# Patient Record
Sex: Male | Born: 1935
Health system: Southern US, Community
[De-identification: ages and names within clinical notes are randomized; demographics above are authoritative.]

## PROBLEM LIST (undated history)

## (undated) DIAGNOSIS — C4490 Unspecified malignant neoplasm of skin, unspecified: Secondary | ICD-10-CM

## (undated) DIAGNOSIS — I340 Nonrheumatic mitral (valve) insufficiency: Secondary | ICD-10-CM

## (undated) DIAGNOSIS — E785 Hyperlipidemia, unspecified: Secondary | ICD-10-CM

## (undated) DIAGNOSIS — N400 Enlarged prostate without lower urinary tract symptoms: Secondary | ICD-10-CM

## (undated) DIAGNOSIS — I2510 Atherosclerotic heart disease of native coronary artery without angina pectoris: Secondary | ICD-10-CM

## (undated) DIAGNOSIS — I4891 Unspecified atrial fibrillation: Secondary | ICD-10-CM

## (undated) HISTORY — PX: POLYPECTOMY: SHX149

## (undated) HISTORY — DX: Atherosclerotic heart disease of native coronary artery without angina pectoris: I25.10

## (undated) HISTORY — DX: Hyperlipidemia, unspecified: E78.5

## (undated) HISTORY — DX: Unspecified malignant neoplasm of skin, unspecified: C44.90

## (undated) HISTORY — PX: COLONOSCOPY: SHX174

## (undated) HISTORY — PX: SKIN CANCER EXCISION: SHX779

## (undated) HISTORY — DX: Nonrheumatic mitral (valve) insufficiency: I34.0

## (undated) HISTORY — DX: Unspecified atrial fibrillation: I48.91

## (undated) HISTORY — DX: Benign prostatic hyperplasia without lower urinary tract symptoms: N40.0

---

## 2004-10-16 ENCOUNTER — Ambulatory Visit: Payer: Self-pay | Admitting: Cardiology

## 2004-12-16 ENCOUNTER — Ambulatory Visit: Payer: Self-pay | Admitting: Cardiology

## 2004-12-20 ENCOUNTER — Ambulatory Visit: Payer: Self-pay

## 2005-06-14 ENCOUNTER — Ambulatory Visit: Payer: Self-pay | Admitting: Cardiology

## 2005-06-27 ENCOUNTER — Ambulatory Visit: Payer: Self-pay

## 2005-08-22 ENCOUNTER — Ambulatory Visit (HOSPITAL_COMMUNITY): Admission: RE | Admit: 2005-08-22 | Discharge: 2005-08-22 | Payer: Self-pay | Admitting: Cardiology

## 2005-08-22 ENCOUNTER — Encounter: Payer: Self-pay | Admitting: Cardiology

## 2005-10-12 HISTORY — PX: CARDIAC CATHETERIZATION: SHX172

## 2005-10-16 ENCOUNTER — Inpatient Hospital Stay (HOSPITAL_COMMUNITY): Admission: RE | Admit: 2005-10-16 | Discharge: 2005-10-23 | Payer: Self-pay | Admitting: Cardiology

## 2005-10-17 ENCOUNTER — Encounter (INDEPENDENT_AMBULATORY_CARE_PROVIDER_SITE_OTHER): Payer: Self-pay | Admitting: *Deleted

## 2005-10-17 HISTORY — PX: MITRAL VALVE REPAIR: SHX2039

## 2005-11-01 ENCOUNTER — Ambulatory Visit: Payer: Self-pay | Admitting: Cardiology

## 2005-11-09 ENCOUNTER — Inpatient Hospital Stay (HOSPITAL_COMMUNITY): Admission: EM | Admit: 2005-11-09 | Discharge: 2005-11-11 | Payer: Self-pay | Admitting: Emergency Medicine

## 2005-11-09 ENCOUNTER — Ambulatory Visit: Payer: Self-pay | Admitting: *Deleted

## 2005-11-10 ENCOUNTER — Encounter: Payer: Self-pay | Admitting: Cardiology

## 2005-12-18 ENCOUNTER — Encounter: Admission: RE | Admit: 2005-12-18 | Discharge: 2005-12-18 | Payer: Self-pay | Admitting: Neurology

## 2005-12-27 ENCOUNTER — Ambulatory Visit: Payer: Self-pay | Admitting: Cardiology

## 2006-03-16 ENCOUNTER — Emergency Department (HOSPITAL_COMMUNITY): Admission: EM | Admit: 2006-03-16 | Discharge: 2006-03-16 | Payer: Self-pay | Admitting: Emergency Medicine

## 2006-04-25 ENCOUNTER — Ambulatory Visit: Payer: Self-pay | Admitting: Cardiology

## 2007-05-01 ENCOUNTER — Ambulatory Visit: Payer: Self-pay | Admitting: Cardiology

## 2008-04-30 ENCOUNTER — Ambulatory Visit: Payer: Self-pay | Admitting: Cardiology

## 2008-05-08 ENCOUNTER — Ambulatory Visit: Payer: Self-pay

## 2008-05-08 ENCOUNTER — Encounter: Payer: Self-pay | Admitting: Cardiology

## 2008-08-13 ENCOUNTER — Ambulatory Visit: Payer: Self-pay | Admitting: Gastroenterology

## 2008-08-27 ENCOUNTER — Ambulatory Visit: Payer: Self-pay | Admitting: Gastroenterology

## 2008-08-27 ENCOUNTER — Encounter: Payer: Self-pay | Admitting: Gastroenterology

## 2008-09-01 ENCOUNTER — Encounter: Payer: Self-pay | Admitting: Gastroenterology

## 2009-05-07 DIAGNOSIS — I4891 Unspecified atrial fibrillation: Secondary | ICD-10-CM | POA: Insufficient documentation

## 2009-05-07 DIAGNOSIS — E785 Hyperlipidemia, unspecified: Secondary | ICD-10-CM | POA: Insufficient documentation

## 2009-05-07 DIAGNOSIS — I080 Rheumatic disorders of both mitral and aortic valves: Secondary | ICD-10-CM

## 2009-05-10 ENCOUNTER — Ambulatory Visit: Payer: Self-pay | Admitting: Cardiology

## 2010-05-11 ENCOUNTER — Ambulatory Visit: Payer: Self-pay | Admitting: Cardiology

## 2010-10-11 NOTE — Assessment & Plan Note (Signed)
Summary: 1 yr rov MVP  pfh,RN   Visit Type:  Follow-up Primary Provider:  Dr. Vernon Prey  CC:  Mitral Valve Repair.  History of Present Illness: The patient presents for followup of mitral regurgitation status post repair. Since I last saw him he has been doing very well. He exercises routinely. He gardens. He has had no chest pressure, neck or arm discomfort. He has had no shortness of breath, PND or orthopnea. He has had no palpitations, presyncope or syncope.  Current Medications (verified): 1)  Aspirin 81 Mg Tbec (Aspirin) .... Take One Tablet By Mouth Daily 2)  Fish Oil 1000 Mg Caps (Omega-3 Fatty Acids) .... Take 1 By Mouth Once Daily 3)  Crestor 5 Mg Tabs (Rosuvastatin Calcium) .... Take One Tablet By Mouth Daily. 4)  Multivitamins  Caps (Multiple Vitamin) .... Take 1 By Mouth Once Daily  Allergies (verified): 1)  ! Penicillin  Past History:  Past Medical History: Reviewed history from 05/07/2009 and no changes required. 1.  Non-obstructive CAD by catheterization February 2007 prior to his      surgery revealing a 40-50% LAD lesion, good LV function with EF 55-60%      by last echocardiogram prior to his surgery.  2.  Mitral regurgitation status post mitral valve repair October 17, 2005 by      Dr. [name].  3.  Postoperative atrial fibrillation maintaining normal sinus rhythm on      amiodarone.  4.  Hyperlipidemia, treated.  5.  Benign prostatic hypertrophy.  Past Surgical History: Reviewed history from 05/07/2009 and no changes required. Mitral valve repair  Review of Systems       As stated in the HPI and negative for all other systems.   Vital Signs:  Patient profile:   76 year old male Height:      66 inches Weight:      162 pounds BMI:     26.24 Pulse rate:   46 / minute Resp:     16 per minute BP sitting:   108 / 66  (right arm)  Vitals Entered By: Marrion Coy, CNA (May 11, 2010 10:50 AM)  Physical Exam  General:  Well developed, well nourished,  in no acute distress. Head:  normocephalic and atraumatic Eyes:  PERRLA/EOM intact; conjunctiva and lids normal. Mouth:  Teeth, gums and palate normal. Oral mucosa normal. Neck:  Neck supple, no JVD. No masses, thyromegaly or abnormal cervical nodes. Chest Wall:  Well-healed surgical scars Lungs:  Clear bilaterally to auscultation and percussion. Abdomen:  Bowel sounds positive; abdomen soft and non-tender without masses, organomegaly, or hernias noted. No hepatosplenomegaly. Msk:  Back normal, normal gait. Muscle strength and tone normal. Extremities:  No clubbing or cyanosis. Neurologic:  Alert and oriented x 3. Skin:  Intact without lesions or rashes. Psych:  Normal affect.   Detailed Cardiovascular Exam  Vascular    Pedal Pulses: pulses normal in all 4 extremities   EKG  Procedure date:  05/11/2010  Findings:      Sinus bradycardia, rate 46, premature ectopic complexes, no acute ST-T wave changes  Impression & Recommendations:  Problem # 1:  MITRAL REGURGITATION (ICD-396.3) He has had a stable mitral valve repair last evaluated by echo in 2009.  He understands endocarditis prophylaxis. No further evaluation is necessary. Orders: EKG w/ Interpretation (93000)  Problem # 2:  ATRIAL FIBRILLATION (ICD-427.31) He has had no recurrence of this. No further testing is suggested. Orders: EKG w/ Interpretation (93000)  Patient Instructions: 1)  Your physician recommends that you schedule a follow-up appointment in: 12 months with Dr Antoine Poche 2)  Your physician recommends that you continue on your current medications as directed. Please refer to the Current Medication list given to you today.

## 2011-01-24 NOTE — Assessment & Plan Note (Signed)
[facility]                            CARDIOLOGY OFFICE NOTE   IOAN, LANDINI                        MRN:          [id number]  DATE:04/30/2008                            DOB:          15-Sep-1934    PRIMARY CARE PHYSICIAN:  Ernestina Penna, [name]   REASON FOR PRESENTATION:  Evaluate the patient with mitral valve repair.   HISTORY OF PRESENT ILLNESS:  Patient returns for yearly followup.  He  has done quite well since I last saw him.  He has had no cardiovascular  complaints.  He walks a mile to 3 miles every day.  He denies any chest  pressure, neck or arm discomfort.  He has no palpitations, presyncope,  or syncope.  He has no PND or orthopnea.   PAST MEDICAL HISTORY:  Severe mitral regurgitation status post mitral  valve repair (trying the resection of the commissural leaflet of the  posterior commissure and trying the plication of the anterior leaflet  prolapse, closure of the posterior leaflet cleft, #26 annuloplasty ring,  February 2007 by Dr. [name]), dyslipidemia.   ALLERGIES:  PENICILLIN.   MEDICATIONS:  Glucosamine, fish oil, aspirin 81 mg daily, Crestor 5 mg  daily, multivitamin, calcium, osteobiflex, and Actonel.   REVIEW OF SYSTEMS:  As stated in the HPI and otherwise negative for  other systems.   PHYSICAL EXAMINATION:  GENERAL:  The patient is in no distress.  VITAL SIGNS:  Blood pressure 138/79, heart rate 61 and regular, and  weight 164 pounds.  HEENT:  Eyes unremarkable, pupils equal, round, and reactive to light,  and fundi not visualized.  Oral mucosa unremarkable.  NECK:  No jugular venous distention at 45 degrees and carotid upstroke  brisk and symmetrical, no bruits, and no thyromegaly.  LYMPHATICS:  No  adenopathy.  LUNGS:  Clear to auscultation bilaterally.  BACK:  No costovertebral angle tenderness.  CHEST:  Well-healed sternotomy scar.  HEART:  PMI not displaced or sustained, S1 and S2 within normal limits.  No  S3, no S4, no clicks, no rubs, and no murmurs.  ABDOMEN:  Flat, positive bowel sounds, normal in frequency and pitch.  No bruits, no rebound, no guarding, no midline pulsatile mass, and no  organomegaly.  SKIN:  No rashes, no nodules.  EXTREMITIES:  2+ pulse.  No edema.   ASSESSMENT AND PLAN:  1. Mitral regurgitation.  The patient is doing well.  It has been over      2 years since the last echocardiogram following his repair.  We      will get an echocardiogram to make sure there is no change or      recurrent regurgitation.  2. Atrial fibrillation.  The patient had this postop.  He has been off      of Coumadin and amiodarone with no symptomatic recurrence.  No      further evaluation is warranted.  3. Although, the patient understands endocarditis prophylaxis.  4. Followup.  I will see the patient back in 1 year or sooner if there      are any  abnormalities on his echocardiogram or he has any new      complaints.     Rollene Rotunda, MD, [facility]  Electronically Signed    JH/MedQ  DD: 04/30/2008  DT: 05/01/2008  Job #: [id number]   cc:   Ernestina Penna, [name]

## 2011-01-24 NOTE — Assessment & Plan Note (Signed)
[facility]                            CARDIOLOGY OFFICE NOTE   NGUYEN, TODOROV                        MRN:          [id number]  DATE:05/01/2007                            DOB:          27-Oct-1934    PRIMARY CARE PHYSICIAN:  Ernestina Penna, [name]   REASON FOR PRESENTATION:  Evaluate the patient status post mitral valve  repair.   HISTORY OF PRESENT ILLNESS:  The patient returns for follow up having  had his repair in February 2007.  Since then, he has had no further  problems.  He denies any shortness of breath.  He has had no chest  discomfort, neck or arm discomfort.  He has had no palpitations, syncope  or presyncope.  He continues to be active and is walking for exercise.   PAST MEDICAL HISTORY:  1. Severe mitral regurgitation status post mitral valve repair      (triangular resection of the commissure leaflet of the posterior      commissure, triangular plication of the anterior leaflet prolapse,      closure of the posterior leaflet cleft #26 annuloplasty ring).  2. Dyslipidemia.   ALLERGIES:  PENICILLIN.   MEDICATIONS:  1. Glucosamine.  2. Fish oil.  3. Aspirin 81 mg daily.  4. Crestor 5 mg daily.  5. Multivitamin.  6. Calcium.  7. Fosamax.  8. OsteoBioflex.   HISTORY OF PRESENT ILLNESS:  As stated in the HPI and otherwise negative  for other systems.   PHYSICAL EXAMINATION:  GENERAL:  The patient is in no distress.  VITAL SIGNS:  Blood pressure 104/68, heart rate 47 and regular.  HEENT:  Eyelids unremarkable.  Pupils equal, round and reactive to  light.  Fundi not visualized.  Oral mucosa unremarkable.  NECK:  No jugular venous distention at 45 degrees.  Carotid upstrokes  brisk and symmetrical, no bruits or thyromegaly.  LYMPHATICS:  No adenopathy.  LUNGS:  Clear to auscultation bilaterally.  BACK:  No costovertebral angle tenderness.  CHEST:  Unremarkable except for well healed sternotomy scar.  HEART:  PMI not  displaced or sustained.  S1, S2 within normal limits.  No S3, S4, clicks, rubs, murmurs.  ABDOMEN:  Flat, positive bowel sounds, normal in frequency, pitch.  No  bruits, rebound, guarding, midline pulses, mass or organomegaly.  SKIN:  No rashes.  EXTREMITIES:  Pulses 2+, no edema.   ASSESSMENT/PLAN:  1. Mitral regurgitation.  The patient is doing well.  He has had no      new symptoms.  His physical examination suggests that his repair is      holding up.  No further imaging is indicated at this point.  2. Atrial fibrillation.  This was postop.  He has had no symptomatic      recurrence of this.  No further cardiovascular testing is      suggested.   FOLLOWUP:  I will see him back in one year or sooner if needed.     Rollene Rotunda, MD, [facility]  Electronically Signed    JH/MedQ  DD: 05/01/2007  DT: 05/02/2007  Job #: [id number]   cc:   Ernestina Penna, [name]

## 2011-01-27 NOTE — Discharge Summary (Signed)
NAMEJAKSON, DELPILAR                 ACCOUNT NO.:  [id number]   MEDICAL RECORD NO.:  [id number]          PATIENT TYPE:  INP   LOCATION:  4738                         FACILITY:  [facility]   PHYSICIAN:  Rollene Rotunda, [name]   DATE OF BIRTH:  [birth year]   DATE OF ADMISSION:  11/09/2005  DATE OF DISCHARGE:  11/11/2005                                 DISCHARGE SUMMARY   PRIMARY CARDIOLOGIST:  Rollene Rotunda, [name]   PRIMARY CARE PHYSICIAN:  Ernestina Penna, [name], in [place].   PRINCIPAL DIAGNOSES:  1.  Presyncope.  2.  Orthostatic hypotension.   OTHER DIAGNOSES:  1.  History of nonobstructive coronary disease.  2.  Normal left ventricular function with an ejection fraction of 55-60%.  3.  Severe mitral regurgitation status post mitral valve repair February 6,      __________.  4.  Postoperative atrial fibrillation, now in sinus rhythm.  5.  Hyperlipidemia.  6.  Benign prostatic hypertrophy.   ALLERGIES:  PENICILLIN.   PROCEDURES:  None.   HISTORY OF PRESENT ILLNESS:  A 76 year old male with a prior history of  mitral valve prolapse and moderate to severe MR who is status post mitral  valve repair by Dr. [name] October 17, 2004.  His postoperative course was  complicated by postop afib which was managed with amiodarone as well as  Coumadin.  He had been doing well with the exception in some decreased  vision since his surgery.  On March 1st, he experienced an episode of  presyncope while walking to his mailbox, stating that he felt dizzy,  lasting about 10-15 minutes with associated left eye diplopia.  He had no  other associated symptoms or syncope.  He presented to the [facility] ED and  decision was made to admit him for further evaluation.   HOSPITAL COURSE:  A CT of the head was performed and was negative for stroke  or other acute abnormalities.  He also underwent MRI/MRA of the head which  showed a tiny acute infarct in the posterior right frontal lobe with no  abnormal  intracranial enhancing lesions.  MRI revealed mild intracranial  atherosclerotic changes without large vessel significant stenoses or  occlusion.  No aneurysms were identified.  He was also found to be  orthostatic with systolic blood pressure dropping approximately 20 mmHg and  heart rate increasing approximately 10 b.p.m.  His amiodarone was  discontinued, as he had maintained sinus rhythm since his surgery.  His D-  dimer was abnormal and a CT angio of his chest was negative for pulmonary  embolism.  Finally, a 2D echocardiogram was performed, revealing an EF of 55-  60% with no perivalvular leak and no obvious vegetation.  Blood cultures  were negative.  He has not had any recurrent presyncope.  He has been  offered compression stockings and is being discharged home today in  satisfactory condition.   DISCHARGE LABS:  Hemoglobin 12.5, hematocrit 36.6, WBC 11.5, platelets 492.  MCV 87.6, sodium 137, potassium 4.3, chloride 103, CO2 27, BUN 6, creatinine  0.9, glucose 111, PT 24.2, INR 2.2,  total bilirubin 0.7, alkaline  phosphatase 61, AST 28, ALT 35, albumin 3.5.  Cardiac markers negative x3.  Calcium was 8.8.  Urinalysis was negative.  Fecal occult blood was negative,  TSH 2.432, D-dimer was 1.12.   DISPOSITION:  Patient is being discharged home today in good condition.   FOLLOWUP PLANS AND APPOINTMENTS:  He has a followup appointment with Dr.  Antoine Poche in approximately 3-4 weeks.  He will be contacted with the date and  time.  He is asked to followup with Dr. Christell Constant within the next 1-2 weeks.   DISCHARGE MEDICATIONS:  Warfarin as previously prescribed, Niaspan 1000 mg  daily, Crestor 5 mg at bedtime, Calcium, salmon oil and glucosamine all as  previously taken.   OUTSTANDING LAB STUDIES:  None.   DURATION OF DISCHARGE ENCOUNTER:  Forty minutes including physician time.      [name], NP    ______________________________  Rollene Rotunda, [name]    CRB/MEDQ   D:  11/11/2005  T:  11/12/2005  Job:  [id number]   cc:   Ernestina Penna, [name]  Fax: [phone]

## 2011-01-27 NOTE — H&P (Signed)
Kyle [name], Kyle [name]:  [id number]   MEDICAL RECORD NO.:  [id number]          PATIENT TYPE:  INP   LOCATION:  1824                         FACILITY:  [facility]   PHYSICIAN:  Kyle [name], [name]   DATE OF BIRTH:  [birth year]   DATE OF ADMISSION:  11/09/2005  DATE OF DISCHARGE:                                HISTORY & PHYSICAL   PRIMARY CARE PHYSICIAN:  Dr. Rudi [name]   PRIMARY CARDIOLOGIST:  Dr. Rollene [name]   CHIEF COMPLAINT:  Presyncope.   HISTORY OF PRESENT ILLNESS:  Kyle [name] is a very pleasant 76 year old male  patient with a history of mitral valve prolapse and moderate to severe  mitral regurgitation status post mitral valve repair by Dr. Cornelius [name] on October 17, 2005.  His postoperative course has been fairly uneventful.  He did have  some postoperative atrial fibrillation that was treated with amiodarone.  He  has maintained normal sinus rhythm since then.  He did see Dr. Antoine [name] on  November 01, 2005.  At that time Dr. Antoine [name] felt that the amiodarone could  be continued.  His next follow-up appointment he would probably discontinue  this and possibly place the patient on a beta blocker if he continued to  have high heart rates.  The patient developed presyncope today while walking  out to his mailbox.  He felt as though he was seeing double.  His wife came  to assist him and she felt that his left eye looked glazed over.  This all  lasted probably 10 or 15 minutes and then resolved.  He denied any  associated chest pain, shortness of breath, nausea, diaphoresis.  Lately he  has noted some chills, but no fevers.  He has been walking about a mile a  day without any symptoms.   PAST MEDICAL HISTORY:  1.  Non-obstructive CAD by catheterization February 2007 prior to his      surgery revealing a 40-50% LAD lesion, good LV function with EF 55-60%      by last echocardiogram prior to his surgery.  2.  Mitral regurgitation status post mitral valve repair  October 17, 2005 by      Dr. Cornelius [name].  3.  Postoperative atrial fibrillation maintaining normal sinus rhythm on      amiodarone.  4.  Hyperlipidemia, treated.  5.  Benign prostatic hypertrophy.   ALLERGIES:  PENICILLIN.   MEDICATIONS:  1.  Warfarin 2.5 mg on Wednesday, Thursday, Saturday, Monday; 5 mg Friday,      Sunday, Tuesday.  2.  Niaspan 1 g daily.  3.  Amiodarone 200 mg a day.  4.  Crestor 5 mg daily.  5.  Calcium.  6.  Salmon oil.  7.  Glucosamine.   SOCIAL HISTORY:  The patient lives in [place].  He is married.  He is  retired from [employer] account [employer].  He denies tobacco or alcohol abuse.   FAMILY HISTORY:  Insignificant for coronary disease or cancer.   HISTORY OF PRESENT ILLNESS:  Please see HPI.  He denies any headaches, sore  throat, vertigo, rash, chest  pain, shortness of breath, dyspnea on exertion,  orthopnea, PND, edema, palpitations, syncope, cough, hemoptysis, dysuria,  hematuria, numbness, facial droop, aphasia, unilateral weakness, nausea,  vomiting, diarrhea, bright red blood per rectum, melena, change in bowel  habits.  He does feel colder than everyone else.  Rest of review of systems  are negative.   PHYSICAL EXAMINATION:  GENERAL:  He is a well-nourished, well-developed.  VITAL SIGNS:  Blood pressure 151/87, pulse 94, respirations 12, oxygen  saturation 90% on room air, 99% on 2 L, temperature 98.  HEENT:  Head normocephalic, atraumatic.  Eyes:  PERRLA.  EOMI.  Sclerae are  clear.  Fundi within normal limits bilaterally.  Throat without erythema or  exudate.  NECK:  Supple without lymphadenopathy, thyromegaly, bruits, or JVD.  CARDIAC:  Normal S1, S2.  Regular rate and rhythm.  No S3.  No murmurs,  rubs, or gallops.  Dorsalis pedis and posterior tibialis pulses are 2+  bilaterally.  LUNGS:  Clear to auscultation without wheezes, rhonchi, or rales.  SKIN:  Without rashes.  ABDOMEN:  Soft, nontender.  Normoactive bowel sounds.  No hepatomegaly.   EXTREMITIES:  Without clubbing, cyanosis, edema.  MUSCULOSKELETAL:  Without spine or CVA tenderness.  NEUROLOGIC:  Alert and oriented x3.  Cranial nerves II-XII grossly intact.  Strength 5/5 in all extremities ___________.   Chest x-ray is pending.  EKG:  Rate 93, normal sinus rhythm.  Axis normal.  No ischemic changes.  Frequent PVCs.  Laboratories pending.   IMPRESSION AND PLAN:  1.  Presyncope.  Will check some laboratory work to include CMET/CBC, PT,      PTT, TSH, D-dimer, chest x-ray, UA, and non-contrast head CT.  Will also      check orthostatic vital signs.  He had structurally normal LV at the      time of his surgery and should have no reason for ventricular      arrhythmias.  He did have postoperative atrial fibrillation and is      maintaining sinus rhythm and denies any palpitations.  He denied any      neurologic symptoms today and examination is nonfocal.  Question whether      or he has neurocardiogenic symptoms.  We will admit him for 24-hour      observation.  Will keep him on telemetry.  Will also get an      echocardiogram to reassess his left ventricular function and to assess      his valves.  He has noticed some chills recently.  Will also get blood      cultures x2 to rule out possibility of endocarditis.  2.  Status post mitral valve repair, recovering nicely.  3.  Postoperative paroxysmal atrial fibrillation maintaining sinus rhythm on      amiodarone.  4.  Hyperlipidemia.  Treated with Crestor.      Tereso [name], P.A.      Kyle [name], [name]  Electronically Signed    SW/MEDQ  D:  11/09/2005  T:  11/09/2005  Job:  [id number]   cc:   Kyle [name], [name]  [address]  [address]  [place]  [place] [address]   Ernestina Penna, [name]  Fax: [phone]

## 2011-01-27 NOTE — Op Note (Signed)
Kyle, [name]:  [id number]   MEDICAL RECORD NO.:  [id number]          PATIENT TYPE:  INP   LOCATION:  2306                         FACILITY:  [facility]   PHYSICIAN:  Quita Skye. Krista Blue, [name]  DATE OF BIRTH:  02/14/1935   DATE OF PROCEDURE:  10/17/2005  DATE OF DISCHARGE:                                 OPERATIVE REPORT   PROCEDURE PERFORMED:  Transesophageal echocardiogram.   INDICATIONS FOR PROCEDURE:  Kyle [name] is a 76 year old white male who  presents to the operating room for mitral valve repair.  Dr. Tressie Stalker  requested transesophageal echocardiogram for intraoperative management of  the patient.   DESCRIPTION OF PROCEDURE:  Following routine cardiac induction, the  transesophageal echo probe was coated with a latex free covering, lubricated  and carefully inserted into the patient's esophagus for cardiac imaging.  A  tooth guard was used for the insertion and the examination.  Overall images  of his heart showed no evidence of pericardial effusion or tamponade.  The  right atrium was normal in size.  There was no evidence of thrombus or  masses.  The interatrial septum had no evidence of defect by Color Doppler.  The tricuspid valve had trace regurgitation with normal-appearing structure.  The right ventricle had good contractility, normal in size, no evidence of  masses.  There was no evidence of interventricular septal defect.  The left  atrium was slightly enlarged with no evidence of thrombus or masses in the  atrium or the appendage.  The pulmonary veins were evaluated and there was  no evidence of reversal of flow.  The S and D waves were measured and were  48 cm per second and 72 cm per second.  The mitral valve was evaluated which  showed a very eccentric regurgitant leak which appeared to originate from  the posterior commissure.  There also appeared to be a small knuckle of  anterior leaflet that was prolapsing more severely.   There was no evidence  of flail and the jet was hard to quantify because of its eccentric nature  which tracked along the lateral wall of the atrium.  The left ventricle was  slightly dilated but had good contractility.  Left ventricular wall size was  right at 1.1 cm.  The aortic valve had three cusps, opened widely.  Peak  flows were right around 100 cm per second with no evidence of stenosis.  There was trace regurgitation seen.  The patient underwent repair of mitral  valve with a ring annuloplasty.  The images of the heart following  separation from bypass showed trace regurgitation but overall excellent  repair of the mitral valve with no evidence of stenosis.  The patient did  well, separated from bypass and was taken to the SICU in good condition.           ______________________________  Quita Skye Krista Blue, [name]     JDS/MEDQ  D:  10/17/2005  T:  10/17/2005  Job:  [id number]
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
## 2011-01-27 NOTE — Op Note (Signed)
NAMEALWYN, CORDNER                 ACCOUNT NO.:  [id number]   MEDICAL RECORD NO.:  [id number]          PATIENT TYPE:  INP   LOCATION:  2306                         FACILITY:  [facility]   PHYSICIAN:  Salvatore Decent. [name], [name] DATE OF BIRTH:  June 23, 1935   DATE OF PROCEDURE:  10/17/2005  DATE OF DISCHARGE:                                 OPERATIVE REPORT   PREOPERATIVE DIAGNOSIS:  Mitral valve regurgitation.   POSTOPERATIVE DIAGNOSIS:  Mitral valve regurgitation.   PROCEDURE:  Median sternotomy for mitral valve repair (triangular resection  of commissural leaflet of posterior commissure, triangular plication of  anterior leaflet prolapse, closure of posterior leaflet cleft, 26 mm Edwards  PhysioRing annuloplasty).   SURGEON:  Dr. Purcell Nails.   ASSISTANT:  Jerold Coombe, P.A.   ANESTHESIA:  General   BRIEF CLINICAL NOTE:  The patient is a 76 year old male from [place], [place], who has been followed for several years with known history of  mitral valve prolapse with mitral regurgitation. The patient has undergone  follow-up serial the echocardiograms by Dr. Rollene Rotunda and was found to  have early signs of left ventricular chamber enlargement with severe mitral  regurgitation. He was referred for elective surgical repair of his mitral  valve. He underwent left and right heart catheterization on October 16, 2005. He was found to have no significant coronary artery disease. Left  ventricular function is normal. A full consultation has been dictated  previously and the patient provides full informed consent for the procedure  as described.   OPERATIVE FINDINGS:  1.  Fibroelastic deficiency with mitral valve prolapse involving the      commissural leaflet of the posterior commissure as well as the anterior      leaflet prolapse.  2.  Normal left ventricular function.  3.  No residual mitral regurgitation after successful mitral valve repair.   OPERATIVE NOTE IN  DETAIL:  The patient is brought to the operating room on  the above-mentioned date and central monitoring is established by the  anesthesia service under the care and direction of Dr. Adonis Huguenin.  Specifically, a Swan-Ganz catheter is placed through the right internal  jugular approach. A radial arterial line is placed. Intravenous antibiotics  are administered. Following induction with general endotracheal anesthesia,  a Foley catheter is placed. The patient's chest, abdomen, both groins, and  both lower extremities are prepared, draped in sterile manner.   Baseline transesophageal echocardiogram is performed by Dr. Krista Blue. This  confirms the presence of mitral valve prolapse with moderate to severe  mitral regurgitation. In particular, the patient has an eccentric jet of  mitral regurgitation with severe prolapse involving what appears to be the  anterior leaflet of the mitral valve near the posterior commissure (A3).  There is normal left ventricular function. No other significant  abnormalities are noted.   A median sternotomy incision is performed. The pericardium is opened. The  patient is heparinized systemically. An aortic cannula is placed in the  ascending aorta uneventfully for cardiopulmonary bypass. A venous cannula is  placed directly in the  superior vena cava. A second venous cannula is placed  low in the right atrium with tip extending down the inferior vena cava. A  retrograde cardioplegic catheter is placed through the right atrium into the  coronary sinus. Adequate heparinization is verified.   Cardiopulmonary bypass is begun. Vessel loops are placed around the superior  vena cava and inferior vena cava. A temperature probe is placed in left  ventricular septum. The epicardial surface of the left ventricle is  inspected and of note, the epicardial coronary arteries appear normal. A  cardioplegic catheter is placed in the ascending aorta.   The patient is cooled to 32  degrees systemic temperature. The aortic  crossclamp is applied and cold blood cardioplegia is administered initially  in antegrade fashion through the aortic root. Iced saline slush is applied  for topical hypothermia. The initial cardioplegic arrest and myocardial  cooling are felt to be excellent. Supplemental cardioplegia is administered  retrograde through the coronary sinus catheter. Repeat doses of cardioplegia  are administered intermittently throughout the crossclamp portion of the  operation antegrade through the aortic root and retrograde through the  coronary sinus catheter to maintain left ventricular septal temperature  below 15 degrees centigrade.   A left atriotomy incision is performed posteriorly through the interatrial  groove. The mitral valve is exposed using a self-retaining retractor.  Exposure is felt to be very good. The mitral valve is carefully examined.  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 The  overall size of the valve is relatively small. There is severe prolapse  involving a fairly large commissural leaflet at the posterior commissure.  There is also some associated prolapse of the anterior leaflet in the A2 and  A3 portion. There is no prolapse involving the posterior leaflet. The  subvalvular apparatus appears essentially normal with exception of the  elongated cords and associated with the areas of prolapse. There are no  ruptured chords. There is minimal calcification in the posterior annulus. No  other abnormalities are appreciated.   Mitral valve repair is accomplished using a combination of techniques.  Initially 2-0 Ethibond horizontal mattress annuloplasty sutures are placed  circumferentially around the mitral valve annulus to facilitate exposure and  ultimately to be utilized for ring annuloplasty. The severely prolapsed commissural leaflet in the posterior commissure is now resected using a  triangular  resection. The commissure is plicated using a single figure-of-  eight compression suture and the remaining vertical defect in the commissure  is closed using interrupted 4-0 Ethibond horizontal mattress everting  sutures. There is some residual prolapse involving the anterior leaflet near  the A2, A3 junction. This is corrected using triangular plication of the  free margin of the anterior leaflet. There is a large cleft in the middle  portion of the posterior leaflet of the mitral valve as well and initially  this is left intact. A single CV-4 Gore-Tex pledgeted suture is placed  through the head of the posterior papillary muscle in the event that  additional cordal support is necessary after ring annuloplasty completed.   The mitral valve is sized to accept a 26 mm annuloplasty ring. The 26 mm  sizer corresponds virtually precisely to the dimensions of the anterior  leaflet of the mitral valve. An Edwards PhysioRing annuloplasty is chosen  (model number [id number], serial number [id number]). Each of the annuloplasty sutures  are placed through the ring and the ring is secured in place. After  completion of the annuloplasty, there is a  fairly symmetrical line of  coaptation of the anterior and posterior leaflet with exception of the area  of the cleft in the posterior leaflet. This is corrected by closing the  cleft with interrupted 4-0 Ethibond simple everting sutures. The valve  appears to be perfectly competent. Because the Gore-Tex chords were not  utilized, the Gore-Tex chords are now is excised. Rewarming is begun.   The left atrial appendage is oversewn from within the left atrium using a  two-layer closure of running 3-0 Prolene suture. Left atriotomy incision is  closed using a two-layer closure of running 3-0 Prolene suture. The patient  is placed in Trendelenburg position. One final dose of warm retrograde hot  shot cardioplegia is administered. The lungs are ventilated and the heart   allowed to fill to evacuate any residual air through the aortic root. The  aortic crossclamp is removed after a total crossclamp time of 116 minutes.   The heart begins to beat spontaneously without need for cardioversion. The  retrograde cardioplegic catheter is removed. Epicardial pacing wires are  fixed to the right ventricular outflow tract  and to the right atrial  appendage. The vessel loops are removed from the superior vena cava and  inferior vena cava. The IVC cannula is removed and its cannulation site  oversewn with Prolene suture. The patient is rewarmed to 37 degrees  centigrade temperature.   The patient is weaned from cardiopulmonary bypass without difficulty. The  patient rhythm at separation from bypass is sinus rhythm with second-degree  AV block. AV sequential pacing is employed. Total cardiopulmonary bypass  time for the operation is 138 minutes. Follow-up transesophageal echocardiogram performed by Dr. Krista Blue after separation from bypass  demonstrates a well seated mitral annuloplasty ring with no residual mitral  regurgitation.   The SVC cannula is removed. The aortic root vent is removed after verifying  no residual air on transesophageal echo. Aortic cannula is removed.  Protamine is administered to reverse the anticoagulation. The mediastinum  was irrigated with saline solution containing vancomycin. Meticulous  surgical hemostasis is ascertained. The mediastinum is now drained using two  chest tubes exited through separate stab incisions inferiorly. The  pericardium and soft tissues anterior to the aorta are reapproximated  loosely.   The On-Q continuous pain management system is utilized for postoperative  pain control.  Two 10-inch Silastic catheters supplied with the On-Q kit are  tunneled into the deep subcutaneous tissues located immediately anterior to  the costal cartilages just lateral to the lateral border of the sternum on  either side. Each of  these catheters are flushed with 5 mL of 0.5%  bupivacaine solution and ultimately they are connected to a  continuous  infusion pump. The sternum is reapproximated with single strength sternal  wire. The soft tissues anterior to the sternum are closed in multiple layers  and the skin is closed with a running subcuticular skin closure.   The patient tolerated the procedure well and is transported to the surgical  intensive care unit in stable condition. There are no intraoperative  complications. All sponge, instrument and needle counts are verified correct  at completion of the operation. No blood products were administered.      Salvatore Decent. [name], [name]  Electronically Signed     CHO/MEDQ  D:  10/17/2005  T:  10/17/2005  Job:  [id number]   cc:   Rollene Rotunda, [name]  [address]  [address]  [place]  [place] [address]   Nicholos Johns  Roe Rutherford, [name]  Fax: [phone]   Ernestina Penna, [name]  Fax: [phone]   [facility]  [place], [place]

## 2011-01-27 NOTE — Cardiovascular Report (Signed)
Kyle [name], Kyle [name]:  [id number]   MEDICAL RECORD NO.:  [id number]          PATIENT TYPE:  INP   LOCATION:  2899                         FACILITY:  [facility]   PHYSICIAN:  Arturo Morton. Riley Kill, [name] [facility] OF BIRTH:  03/04/1935   DATE OF PROCEDURE:  10/16/2005  DATE OF DISCHARGE:                              CARDIAC CATHETERIZATION   INDICATIONS:  Kyle [name] is a 76 year old gentleman who has been followed by  Dr. Antoine Poche with mitral regurgitation.  TEE has been performed and the  patient has seen Dr. [name].  The plan has been for mitral valve repair based  on the guidelines.  He is brought to the catheterization laboratory for  right and left heart catheterization and coronary arteriography.  Risks,  benefits and alternatives were discussed with the patient in detail prior to  the procedure, and he was agreeable to proceed.   PROCEDURE:  1.  Right and left heart catheterization.  2.  Selective coronary arteriography.  3.  Selective left ventriculography in biplane fashion.  4.  Hand injection into the aortic root.   DESCRIPTION OF PROCEDURE:  The patient was brought to the catheterization  laboratory, prepped and draped in the usual fashion.  Through an anterior  puncture, the right femoral vein was entered. A 7-French sheath was placed.  A 7.5-French thermodilution Swan-Ganz catheter was then passed to the  superior vena cava where saturation was obtained.  Serial pressures were  then obtained in the right heart and then a pulmonary artery saturation  obtained.  Thermodilution cardiac outputs were then performed.  Following  this, we then entered the right femoral artery and a 6-French sheath was  placed.  Central aortic and left ventricular pressures were measured with a  pigtail.  Simultaneous wedge LV was then performed.  The right heart  catheter was then pulled back.  Following this, ventriculography was  performed in the RAO projection.  It was then  performed as well in the LAO  projection.  We did a brief hand injection into the aortic root to make sure  there was no significant aortic regurgitation.  Coronary arteriography was  then performed using standard Judkins catheters.  He tolerated the procedure  without complication and was taken to the holding area in satisfactory  clinical condition.   I then called Dr. Antoine Poche and notified him of the results.   HEMODYNAMIC DATA.:  1.  Right atrium 5.  2.  Right ventricle 33/7.  3.  Pulmonary artery 32/9.  4.  Pulmonary capillary wedge 11.  5.  No mitral valve gradient or V wave  6.  Aortic 143/65.  7.  LV 144/15.  8.  Fick cardiac output 3.3 liters per minute.  9.  Fick cardiac index 1.7 liters per minute/m2.  10. Thermodilution cardiac output 4.2 liters per minute.  11. Thermodilution cardiac index 2.3 liters per minute/m2.  12. Superior vena cava saturation 61%.  13. Pulmonary artery saturation 66%.  14. Aortic saturation 96%.   ANGIOGRAPHIC DATA.:  1.  Ventriculography was performed in the RAO and LAO projections.  Angiographically at least there appeared to be moderate regurgitation in      the mitral distribution.  One could see some filling of the left atrium.      We cannot exclude the possibility of moderately severe which has been      demonstrated by TEE study.  2  The left main coronary is free of critical disease.  3 The left anterior descending artery courses to the apex.  After the first  diagonal and prior to the second diagonal, there is a 40-50% slightly  segmental eccentric plaque.  This is best seen in the LAO cranial  projections.  The remainder of the vessel was without critical narrowing.  1.  The circumflex vessel provides a major marginal branch, a smaller      marginal branch and other than minimal luminal irregularity is free of      critical disease.  2.  The right coronary artery demonstrates some calcification at the ostium      but no  damping with engagement of a 2-mm catheter.  In the first bend at      the junction of proximal and mid vessel was a 20% area of eccentric      narrowing.  The distal right coronary consists of posterior descending      and posterolateral branches, all of which for appear free of critical      disease.  3.  A hand injection into the aortic root does not demonstrate significant      aortic regurgitation.   CONCLUSION:  1.  Well-preserved left ventricular function.  2.  A 40-50% left anterior descending eccentric plaque.  3.  Borderline right heart pressure elevation with slight decrease in      cardiac output.   DISPOSITION:  Dr. Antoine Poche and Dr. [name] will evaluate and review the data  and final decisions made.  The plan is for mitral valve repair tomorrow.      Arturo Morton. Riley Kill, [name] [facility]  Electronically Signed     TDS/MEDQ  D:  10/16/2005  T:  10/16/2005  Job:  [id number]   cc:   Ernestina Penna, [name]  Fax: [phone]   Rollene Rotunda, [name]  [phone] [address]  [address]  [place]  [place] [address]   Salvatore Decent. [name], [name]  [address]  [place]  [place] [address]   CV Laboratory

## 2011-01-27 NOTE — Discharge Summary (Signed)
Kyle [name], Kyle [name]:  [id number]   MEDICAL RECORD NO.:  [id number]          PATIENT TYPE:  INP   LOCATION:  2009                         FACILITY:  [facility]   PHYSICIAN:  Salvatore Decent. [name], [name] DATE OF BIRTH:  Aug 11, 1935   DATE OF ADMISSION:  10/16/2005  DATE OF DISCHARGE:  10/23/2005                                 DISCHARGE SUMMARY   ADMISSION DIAGNOSIS:  Mitral valve replacement.   DISCHARGE DIAGNOSES:  1.  Mitral regurgitation, status post mitral valve replacement.  2.  History of mitral valve prolapse with mitral regurgitation.  3.  Hyperlipidemia.   SERVICE:  CVTS.   CONSULTATIONS:  None.   PROCEDURE:  On October 16, 2005, the patient underwent cardiac  catheterization by Arturo Morton. Riley Kill, [name]  On October 17, 2005, the  patient underwent a mitral valve repair and median sternotomy (triangular  resection of commissural leaflet of posterior commissure, triangular  plication of anterior leaflet prolapse, closure of posterior leaflet cleft,  26 mm Edwards PhysioRing annuloplasty) by Salvatore Decent. [name], [name]  On  October 17, 2005, the patient underwent a transesophageal echocardiogram by  Quita Skye. Krista Blue, [name]   HISTORY AND PHYSICAL:  Mr. Flater is a 76 year old retired white male from  [place], [place], who has been followed by Dr. Antoine Poche for the past  six or seven years with known history of mitral regurgitation.  Mr. Olander was  originally noted to have a heart murmur on physical examination, and this  prompted referral to Dr. Antoine Poche.  Echocardiograms have revealed mitral  valve prolapse and mitral regurgitation.  Mr. Stites has undergone routine  annual follow-up examination until more recently when the degree of mitral  regurgitation has become more severe.  Over the last year, there has been  additional development of signs of early ventricular chamber enlargement,  and this prompted Dr. Antoine Poche to begin to discuss elective mitral  valve  repair with Mr. Somerville.  The most recent 2-D echocardiogram  was performed  June 27, 2005.  At that time, there remained evidence for mitral valve  prolapse involving the anterior leaflet of the mitral valve with moderate to  severe mitral regurgitation.  Left ventricular end diastolic dimensions had  increased somewhat to 52 mm with an end systolic dimension of 32 mm.  Left  ventricular systolic function remained normal with ejection fraction of 55  to 65% and no significant wall motion abnormalities.  Subsequently, Mr. Havlin  underwent transesophageal echocardiogram by Olga Millers, [name], on  August 22, 2005.  This confirmed the presence of mitral valve prolapse  involving primarily the anterior leaflet of the mitral valve and moderate to  severe mitral regurgitation with an eccentric jet of regurgitation directed  around the posterolateral portion of the left atrium.  No other significant  abnormalities were identified.  Mr. Vanriper agreed to have elective mitral  valve repair.   HOSPITAL COURSE:  On postoperative day #1, patient was doing well and  remained afebrile, blood pressure was 110/50.  Patient remained AI paced  with junctional brady underneath.  The patient's labs were  within normal  limits.  He did have some mild postoperative anemia which has resolved by  the time of discharge.  The patient does experience some volume overload and  was treated with Lasix 40 mg b.i.d. and K-Dur 20 mEq p.o. b.i.d.  Patient  responded adequately to diuresis by the time of discharge.   On October 19, 2005, patient was transferred to [facility] and was started on  Coumadin.  The patient did experience some thrombocytopenia with a platelet  count of 67.  Patient's thrombocytopenia has been improving throughout his  hospital stay and on discharge, his platelet count has increased to 117.  The patient was AI paced until October 21, 2005.  Since then she has been  maintained in normal sinus  rhythm.  His pacing wires were discontinued on  October 22, 2005.  Patient's INR level is at 1.8 on October 22, 2005.  The  patient has been mobilizing adequately with cardiac rehab and has been doing  very well.  The patient does have a history of atrial fibrillation.  He is  on amiodarone for prophylaxis.   PHYSICAL EXAMINATION:  VITAL SIGNS:  Patient is afebrile, vital signs are  stable.  Patient remains in normal sinus rhythm.  Her weight is 164,  preoperative weight was 165.  Chest x-ray showed right effusion/atelectasis improving.  An INR of 1.8.  LUNGS:  Decreased breath sounds at right base.  CARDIOVASCULAR:  Regular rate and rhythm.  ABDOMEN:  Soft, nontender and nondistended, positive bowel sounds x4.  INCISION:  Sternal with minimal drainage with small area of skin separation  distal portion, no evidence of infection.  EXTREMITIES:  No edema.   CONDITION ON DISCHARGE:  Stable.   DISPOSITION:  Patient will be discharged to home.   DISCHARGE INSTRUCTIONS:  The patient is instructed to follow a low fat, low  salt diet.  He should do no driving or heavy lifting greater than 10 pounds  for three weeks.  He is to continue daily walking and breathing exercises.  Patient may shower, clean wounds with mild soap and water. He should call  the office if any wound problems shall arise.  Patient will follow up with  [facility] to check his INR level, this will be done on  October 25, 2005.  The patient has to call Dr. Antoine Poche for an appointment  in two weeks.  He has an appointment with Dr. [name] in three weeks.    [facility]oumadin Clinic will maintain the patient's Coumadin dose.   MEDICATIONS:  1.  Crestor 20 mg p.o. daily.  2.  Amiodarone 200 mg p.o. daily.  3.  Lasix 40 mg p.o. daily x5 days.  4.  K-Dur 20 mEq p.o. daily x5 days.  5.  Niaspan 1000 mg p.o. daily.  6.  Multivitamins daily.  7.  Tylox one to two tablets q.4h. p.r.n. 8.  The patient will continue  her home of glucosamine chondroitin, fish oil,      folic acid, vitamin B as needed.  9.  Coumadin will be dosed at time of discharge where she will follow up at      the [facility] as stated above.      Constance Holster, PA      Salvatore Decent. [name], [name]  Electronically Signed    JMW/MEDQ  D:  10/22/2005  T:  10/23/2005  Job:  [id number]   cc:   Rollene Rotunda, [name]  [address]  [address]  [place]  [place] [address]   Magnus Sinning. Rice, [name]  Fax: [phone]

## 2011-01-27 NOTE — Assessment & Plan Note (Signed)
[facility]                              CARDIOLOGY OFFICE NOTE   SORIN, FRIMPONG                        MRN:          [id number]  DATE:04/25/2006                            DOB:          1935-04-15    The primary is Kyle Penna, MD   REASON FOR PRESENTATION:  Evaluate patient status post mitral valve repair.   HISTORY OF PRESENT ILLNESS:  The patient returns for follow-up of his mitral  valve repair.  He is 76 years old.  He is doing very well.  He is walking 3  miles a day.  He has no chest discomfort or shortness of breath.  He has no  palpitations, presyncope or syncope.   PAST MEDICAL HISTORY:  1. Severe mitral regurgitation, status post mitral valve repair      (triangular resection of the commissure leaflet of the posterior      commissure, triangular plication of the anterior leaflet prolapse,      closure of the posterior leaflet cleft, #26 annuloplasty ring).  2. Dyslipidemia.   ALLERGIES:  PENICILLIN.   MEDICATIONS:  Glucosamine, fish oil, aspirin, Crestor 5 mg per day, Coumadin  as directed.   REVIEW OF SYSTEMS:  As stated in the HPI and otherwise negative for other  systems.   PHYSICAL EXAMINATION:  GENERAL:  The patient is in no distress.  VITAL SIGNS:  Blood pressure 128/76, heart rate 60 and regular.  NECK:  No jugular venous distention.  Wave form within normal limits.  Carotid upstroke brisk and symmetric.  No bruits, no thyromegaly.  LYMPHATIC:  __________.  LUNGS:  Clear to auscultation bilaterally.  BACK:  No costovertebral angle tenderness.  CHEST WALL:  Healed sternotomy scar.  CARDIAC:  PMI not displaced or sustained, S1, S2 within normal limits, no  S3, no S4, no murmurs.  ABDOMEN:  Flat, positive bowel sounds, normal frequency and pitch, no  bruits, rebound, guarding, no midline pulsatile mass, no organomegaly.  SKIN:  No rash, no nodule.  EXTREMITIES:  No edema.  NEUROLOGIC:  Grossly intact.   EKG:   Sinus rhythm, rate 62, axis within normal limits, intervals within  normal limits, no acute ST wave changes.   ASSESSMENT AND PLAN:  1. Mitral valve repair.  He is doing well with respect to this.  No      further cardiovascular testing is suggested.  I believe he has one more      follow-up with Dr. [name].  We discussed the new endocarditis guidelines.  2. Atrial fibrillation.  The patient had postoperative atrial      fibrillation.  He has had absolutely no palpitations since recovering      from surgery.  He has had absolutely no neurologic complaints about      dizziness, blurred vision or other.  At this point I think the risk of      Coumadin outweighs the benefits.  The patient very much wants to come      off this medication.  We discussed this, and he will stop the Coumadin.  He will let me know if he ever has any further palpitations or      neurologic symptoms.   FOLLOW-UP:  I will see him back in 12 months or sooner if needed.                               Rollene Rotunda, MD, [facility]    JH/MedQ  DD:  04/25/2006  DT:  04/25/2006  Job #:  [id number]   cc:   Kyle Penna, MD

## 2011-05-16 ENCOUNTER — Encounter: Payer: Self-pay | Admitting: Cardiology

## 2011-05-17 ENCOUNTER — Ambulatory Visit (INDEPENDENT_AMBULATORY_CARE_PROVIDER_SITE_OTHER): Payer: Medicare Other | Admitting: Cardiology

## 2011-05-17 ENCOUNTER — Encounter: Payer: Self-pay | Admitting: Cardiology

## 2011-05-17 DIAGNOSIS — E785 Hyperlipidemia, unspecified: Secondary | ICD-10-CM

## 2011-05-17 DIAGNOSIS — I4891 Unspecified atrial fibrillation: Secondary | ICD-10-CM

## 2011-05-17 DIAGNOSIS — I080 Rheumatic disorders of both mitral and aortic valves: Secondary | ICD-10-CM

## 2011-05-17 NOTE — Patient Instructions (Signed)
Your physician has requested that you have an echocardiogram. Echocardiography is a painless test that uses sound waves to create images of your heart. It provides your doctor with information about the size and shape of your heart and how well your heart's chambers and valves are working. This procedure takes approximately one hour. There are no restrictions for this procedure.  The current medical regimen is effective;  continue present plan and medications.  Follow up in 1 year with Dr Hochrein.  You will receive a letter in the mail 2 months before you are due.  Please call us when you receive this letter to schedule your follow up appointment.  

## 2011-05-17 NOTE — Progress Notes (Signed)
HPI The patient presents for follow up of MV repair.  Since I last saw her she has done well. The patient denies any new symptoms such as chest discomfort, neck or arm discomfort. There has been no new shortness of breath, PND or orthopnea. There have been no reported palpitations, presyncope or syncope.  He is doing a lot of work around the house as his wife became paralyzed this year.  Allergies  Allergen Reactions  . Penicillins     REACTION: dizziness/syncope    Current Outpatient Prescriptions  Medication Sig Dispense Refill  . aspirin 81 MG tablet Take 81 mg by mouth daily.        . fish oil-omega-3 fatty acids 1000 MG capsule Take 1 capsule by mouth daily.        . Multiple Vitamin (MULTIVITAMIN) tablet Take 1 tablet by mouth daily.        . rosuvastatin (CRESTOR) 5 MG tablet Take 5 mg by mouth daily.          Past Medical History  Diagnosis Date  . CAD (coronary artery disease)     Non obstructive by catheterization Feb 2007 prior to his surgery revealing a 40-50% LAD lesion, good LV function with EF 55-60% by last echocardiogram prior to his surgery  . Mitral regurgitation     S/P mitral valve repair Oct 17, 2005 by Dr. [name]  . Atrial fibrillation     Post operative, maintaining normal sinus rhythm on amiodarone  . Hyperlipidemia     Treated  . Benign prostatic hypertrophy     Past Surgical History  Procedure Date  . Cardiac catheterization 10/2005  . Mitral valve repair 10/17/2005    Dr. [name]    ROS:  As stated in the HPI and negative for all other systems.  PHYSICAL EXAM BP 128/66  Pulse 54  Resp 16  Ht 5\' 7"  (1.702 m)  Wt 161 lb (73.029 kg)  BMI 25.22 kg/m2 GENERAL:  Well appearing HEENT:  Pupils equal round and reactive, fundi not visualized, oral mucosa unremarkable NECK:  No jugular venous distention, waveform within normal limits, carotid upstroke brisk and symmetric, no bruits, no thyromegaly LYMPHATICS:  No cervical, inguinal adenopathy LUNGS:  Clear  to auscultation bilaterally BACK:  No CVA tenderness CHEST:  Well healed sternotomy scar. HEART:  PMI not displaced or sustained,S1 and S2 within normal limits, no S3, no S4, no clicks, no rubs, no murmurs ABD:  Flat, positive bowel sounds normal in frequency in pitch, no bruits, no rebound, no guarding, no midline pulsatile mass, no hepatomegaly, no splenomegaly EXT:  2 plus pulses throughout, no edema, no cyanosis no clubbing SKIN:  No rashes no nodules NEURO:  Cranial nerves II through XII grossly intact, motor grossly intact throughout PSYCH:  Cognitively intact, oriented to person place and time  EKG:  Sinus rhythm, rate 52, axis within normal limits, intervals within normal limits, no acute ST-T wave changes.   ASSESSMENT AND PLAN

## 2011-05-17 NOTE — Assessment & Plan Note (Signed)
I will check an echo since it has been three years since the previous echo and 5 years since the repair.

## 2011-05-17 NOTE — Assessment & Plan Note (Signed)
He has had no recurrence of this since surgery.

## 2011-05-23 ENCOUNTER — Ambulatory Visit (HOSPITAL_COMMUNITY): Payer: Medicare Other | Attending: Cardiology | Admitting: Radiology

## 2011-05-23 DIAGNOSIS — I059 Rheumatic mitral valve disease, unspecified: Secondary | ICD-10-CM

## 2011-05-23 DIAGNOSIS — I379 Nonrheumatic pulmonary valve disorder, unspecified: Secondary | ICD-10-CM | POA: Insufficient documentation

## 2011-05-23 DIAGNOSIS — I080 Rheumatic disorders of both mitral and aortic valves: Secondary | ICD-10-CM | POA: Insufficient documentation

## 2011-05-23 DIAGNOSIS — I4891 Unspecified atrial fibrillation: Secondary | ICD-10-CM | POA: Insufficient documentation

## 2011-05-23 DIAGNOSIS — I079 Rheumatic tricuspid valve disease, unspecified: Secondary | ICD-10-CM | POA: Insufficient documentation

## 2011-09-25 DIAGNOSIS — I2510 Atherosclerotic heart disease of native coronary artery without angina pectoris: Secondary | ICD-10-CM | POA: Diagnosis not present

## 2011-09-25 DIAGNOSIS — N400 Enlarged prostate without lower urinary tract symptoms: Secondary | ICD-10-CM | POA: Diagnosis not present

## 2011-09-25 DIAGNOSIS — E785 Hyperlipidemia, unspecified: Secondary | ICD-10-CM | POA: Diagnosis not present

## 2011-11-16 DIAGNOSIS — H5200 Hypermetropia, unspecified eye: Secondary | ICD-10-CM | POA: Diagnosis not present

## 2011-11-16 DIAGNOSIS — H524 Presbyopia: Secondary | ICD-10-CM | POA: Diagnosis not present

## 2011-11-16 DIAGNOSIS — H35379 Puckering of macula, unspecified eye: Secondary | ICD-10-CM | POA: Diagnosis not present

## 2011-11-16 DIAGNOSIS — H52229 Regular astigmatism, unspecified eye: Secondary | ICD-10-CM | POA: Diagnosis not present

## 2012-02-19 DIAGNOSIS — I2510 Atherosclerotic heart disease of native coronary artery without angina pectoris: Secondary | ICD-10-CM | POA: Diagnosis not present

## 2012-02-19 DIAGNOSIS — E785 Hyperlipidemia, unspecified: Secondary | ICD-10-CM | POA: Diagnosis not present

## 2012-04-02 DIAGNOSIS — H35379 Puckering of macula, unspecified eye: Secondary | ICD-10-CM | POA: Diagnosis not present

## 2012-07-01 DIAGNOSIS — Z23 Encounter for immunization: Secondary | ICD-10-CM | POA: Diagnosis not present

## 2012-08-19 DIAGNOSIS — I10 Essential (primary) hypertension: Secondary | ICD-10-CM | POA: Diagnosis not present

## 2012-08-19 DIAGNOSIS — I6789 Other cerebrovascular disease: Secondary | ICD-10-CM | POA: Diagnosis not present

## 2012-08-19 DIAGNOSIS — E785 Hyperlipidemia, unspecified: Secondary | ICD-10-CM | POA: Diagnosis not present

## 2012-12-31 DIAGNOSIS — H2510 Age-related nuclear cataract, unspecified eye: Secondary | ICD-10-CM | POA: Diagnosis not present

## 2013-01-28 DIAGNOSIS — H25019 Cortical age-related cataract, unspecified eye: Secondary | ICD-10-CM | POA: Diagnosis not present

## 2013-01-28 DIAGNOSIS — H2510 Age-related nuclear cataract, unspecified eye: Secondary | ICD-10-CM | POA: Diagnosis not present

## 2013-01-28 DIAGNOSIS — H04129 Dry eye syndrome of unspecified lacrimal gland: Secondary | ICD-10-CM | POA: Diagnosis not present

## 2013-01-28 DIAGNOSIS — H40009 Preglaucoma, unspecified, unspecified eye: Secondary | ICD-10-CM | POA: Diagnosis not present

## 2013-02-17 ENCOUNTER — Ambulatory Visit (INDEPENDENT_AMBULATORY_CARE_PROVIDER_SITE_OTHER): Payer: Medicare Other | Admitting: Family Medicine

## 2013-02-17 ENCOUNTER — Encounter: Payer: Self-pay | Admitting: Family Medicine

## 2013-02-17 VITALS — BP 147/70 | HR 51 | Temp 97.6°F | Ht 68.0 in | Wt 155.2 lb

## 2013-02-17 DIAGNOSIS — M810 Age-related osteoporosis without current pathological fracture: Secondary | ICD-10-CM

## 2013-02-17 DIAGNOSIS — I059 Rheumatic mitral valve disease, unspecified: Secondary | ICD-10-CM

## 2013-02-17 DIAGNOSIS — I341 Nonrheumatic mitral (valve) prolapse: Secondary | ICD-10-CM

## 2013-02-17 DIAGNOSIS — E785 Hyperlipidemia, unspecified: Secondary | ICD-10-CM | POA: Diagnosis not present

## 2013-02-17 LAB — COMPLETE METABOLIC PANEL WITH GFR
ALT: 16 U/L (ref 0–53)
AST: 20 U/L (ref 0–37)
Albumin: 4.2 g/dL (ref 3.5–5.2)
Alkaline Phosphatase: 37 U/L — ABNORMAL LOW (ref 39–117)
BUN: 10 mg/dL (ref 6–23)
CO2: 27 mEq/L (ref 19–32)
Calcium: 9.3 mg/dL (ref 8.4–10.5)
Chloride: 104 mEq/L (ref 96–112)
Creat: 0.81 mg/dL (ref 0.50–1.35)
GFR, Est African American: >89 mL/min
GFR, Est Non African American: 86 mL/min
Glucose, Bld: 91 mg/dL (ref 70–99)
Potassium: 4.8 mEq/L (ref 3.5–5.3)
Sodium: 139 mEq/L (ref 135–145)
Total Bilirubin: 0.8 mg/dL (ref 0.3–1.2)
Total Protein: 6.5 g/dL (ref 6.0–8.3)

## 2013-02-17 NOTE — Progress Notes (Signed)
Patient ID: Kyle [name], male   DOB: 07-26-1935, 78 y.o.   MRN: [id number] SUBJECTIVE: Chief Complaint  Patient presents with  . Follow-up    6 month follow up  fasting   HPI: Patient is here for follow up of hyperlipidemia:  denies Headache;denies Chest Pain;denies weakness;denies Shortness of Breath and orthopnea;denies Visual changes;denies palpitations;denies cough;denies pedal edema;denies symptoms of TIA or stroke;deniesClaudication symptoms. admits to Compliance with medications; denies Problems with medications.    PMH/PSH: reviewed/updated in Epic  SH/FH: reviewed/updated in Epic  Allergies: reviewed/updated in Epic  Medications: reviewed/updated in Epic  Immunizations: reviewed/updated in Epic  ROS: As above in the HPI. All other systems are stable or negative.  OBJECTIVE: APPEARANCE:  Patient in no acute distress.The patient appeared well nourished and normally developed. Acyanotic. Waist: VITAL SIGNS:  SKIN: warm and  Dry without overt rashes, tattoos and scars  HEAD and Neck: without JVD, Head and scalp: normal Eyes:No scleral icterus. Fundi normal, eye movements normal. Ears: Auricle normal, canal normal, Tympanic membranes normal, insufflation normal. Nose: normal Throat: normal Neck & thyroid: normal  CHEST & LUNGS: Chest wall: normal Lungs: Clear  CVS: Reveals the PMI to be normally located. Regular rhythm, First and Second Heart sounds are normal,  No change in murmurs, rubs or gallops. Peripheral vasculature: Radial pulses: normal Dorsal pedis pulses: normal Posterior pulses: normal  ABDOMEN:  Appearance: normal Benign, no organomegaly, no masses, no Abdominal Aortic enlargement. No Guarding , no rebound. No Bruits. Bowel sounds: normal  RECTAL: N/A GU: N/A  EXTREMETIES: nonedematous. Both Femoral and Pedal pulses are normal.  MUSCULOSKELETAL:  Spine: normal Joints: intact  NEUROLOGIC: oriented to time,place and person;  nonfocal. Strength is normal Sensory is normal Reflexes are normal Cranial Nerves are normal.  ASSESSMENT: Osteoporosis, unspecified - Plan: COMPLETE METABOLIC PANEL WITH GFR, Vitamin D 25 hydroxy  HLD (hyperlipidemia) - Plan: COMPLETE METABOLIC PANEL WITH GFR, NMR Lipoprofile with Lipids  MVP (mitral valve prolapse)   PLAN: Orders Placed This Encounter  Procedures  . COMPLETE METABOLIC PANEL WITH GFR  . NMR Lipoprofile with Lipids  . Vitamin D 25 hydroxy   No orders of the defined types were placed in this encounter.         Dr Woodroe Mode Recommendations  Diet and Exercise discussed with patient.  For nutrition information, I recommend books:  1).Eat to Live by Dr Monico Hoar. 2).Prevent and Reverse Heart Disease by Dr Suzzette Righter. 3) Dr Katherina Right Book: Reversing Diabetes  Exercise recommendations are:  If unable to walk, then the patient can exercise in a chair 3 times a day. By flapping arms like a bird gently and raising legs outwards to the front.  If ambulatory, the patient can go for walks for 30 minutes 3 times a week. Then increase the intensity and duration as tolerated.  Goal is to try to attain exercise frequency to 5 times a week.  If applicable: Best to perform resistance exercises (machines or weights) 2 days a week and cardio type exercises 3 days per week. Return in about 4 months (around 06/19/2013) for Recheck medical problems.   Angeldejesus Callaham P. Modesto Charon, [name]

## 2013-02-17 NOTE — Patient Instructions (Addendum)
      Dr Ladasia Sircy's Recommendations  Diet and Exercise discussed with patient.  For nutrition information, I recommend books:  1).Eat to Live by Dr Joel Fuhrman. 2).Prevent and Reverse Heart Disease by Dr Caldwell Esselstyn. 3) Dr Neal Barnard's Book: Reversing Diabetes  Exercise recommendations are:  If unable to walk, then the patient can exercise in a chair 3 times a day. By flapping arms like a bird gently and raising legs outwards to the front.  If ambulatory, the patient can go for walks for 30 minutes 3 times a week. Then increase the intensity and duration as tolerated.  Goal is to try to attain exercise frequency to 5 times a week.  If applicable: Best to perform resistance exercises (machines or weights) 2 days a week and cardio type exercises 3 days per week.  

## 2013-02-18 ENCOUNTER — Other Ambulatory Visit: Payer: Self-pay | Admitting: Family Medicine

## 2013-02-18 DIAGNOSIS — H25019 Cortical age-related cataract, unspecified eye: Secondary | ICD-10-CM | POA: Diagnosis not present

## 2013-02-18 DIAGNOSIS — E785 Hyperlipidemia, unspecified: Secondary | ICD-10-CM

## 2013-02-18 DIAGNOSIS — H2510 Age-related nuclear cataract, unspecified eye: Secondary | ICD-10-CM | POA: Diagnosis not present

## 2013-02-18 LAB — NMR LIPOPROFILE WITH LIPIDS
Cholesterol, Total: 168 mg/dL (ref <200)
HDL Particle Number: 29 umol/L — ABNORMAL LOW (ref >=30.5)
HDL Size: 8.9 nm — ABNORMAL LOW (ref >=9.2)
HDL-C: 40 mg/dL (ref >=40)
LDL (calc): 109 mg/dL — ABNORMAL HIGH (ref <100)
LDL Particle Number: 1493 nmol/L — ABNORMAL HIGH (ref <1000)
LDL Size: 20.7 nm (ref >20.5)
LP-IR Score: 39 (ref <=45)
Large HDL-P: 5.4 umol/L (ref >=4.8)
Large VLDL-P: 1 nmol/L (ref <=2.7)
Small LDL Particle Number: 620 nmol/L — ABNORMAL HIGH (ref <=527)
Triglycerides: 95 mg/dL (ref <150)
VLDL Size: 44.6 nm (ref <=46.6)

## 2013-02-18 LAB — VITAMIN D 25 HYDROXY (VIT D DEFICIENCY, FRACTURES): Vit D, 25-Hydroxy: 54 ng/mL (ref 30–89)

## 2013-02-18 MED ORDER — ROSUVASTATIN CALCIUM 10 MG PO TABS: 10.0000 mg | ORAL_TABLET | Freq: Every day | ORAL | Status: DC

## 2013-03-03 DIAGNOSIS — H269 Unspecified cataract: Secondary | ICD-10-CM | POA: Diagnosis not present

## 2013-03-03 DIAGNOSIS — Z79899 Other long term (current) drug therapy: Secondary | ICD-10-CM | POA: Diagnosis not present

## 2013-03-03 DIAGNOSIS — H04129 Dry eye syndrome of unspecified lacrimal gland: Secondary | ICD-10-CM | POA: Diagnosis not present

## 2013-03-03 DIAGNOSIS — Z87891 Personal history of nicotine dependence: Secondary | ICD-10-CM | POA: Diagnosis not present

## 2013-03-03 DIAGNOSIS — Z880 Allergy status to penicillin: Secondary | ICD-10-CM | POA: Diagnosis not present

## 2013-03-03 DIAGNOSIS — H40009 Preglaucoma, unspecified, unspecified eye: Secondary | ICD-10-CM | POA: Diagnosis not present

## 2013-03-03 DIAGNOSIS — H2510 Age-related nuclear cataract, unspecified eye: Secondary | ICD-10-CM | POA: Diagnosis not present

## 2013-03-03 DIAGNOSIS — Z954 Presence of other heart-valve replacement: Secondary | ICD-10-CM | POA: Diagnosis not present

## 2013-03-03 DIAGNOSIS — E7800 Pure hypercholesterolemia, unspecified: Secondary | ICD-10-CM | POA: Diagnosis not present

## 2013-03-03 DIAGNOSIS — H25019 Cortical age-related cataract, unspecified eye: Secondary | ICD-10-CM | POA: Diagnosis not present

## 2013-03-24 DIAGNOSIS — H2510 Age-related nuclear cataract, unspecified eye: Secondary | ICD-10-CM | POA: Diagnosis not present

## 2013-03-24 DIAGNOSIS — H25019 Cortical age-related cataract, unspecified eye: Secondary | ICD-10-CM | POA: Diagnosis not present

## 2013-03-31 DIAGNOSIS — E7800 Pure hypercholesterolemia, unspecified: Secondary | ICD-10-CM | POA: Diagnosis not present

## 2013-03-31 DIAGNOSIS — Z87891 Personal history of nicotine dependence: Secondary | ICD-10-CM | POA: Diagnosis not present

## 2013-03-31 DIAGNOSIS — Z9849 Cataract extraction status, unspecified eye: Secondary | ICD-10-CM | POA: Diagnosis not present

## 2013-03-31 DIAGNOSIS — Z79899 Other long term (current) drug therapy: Secondary | ICD-10-CM | POA: Diagnosis not present

## 2013-03-31 DIAGNOSIS — H269 Unspecified cataract: Secondary | ICD-10-CM | POA: Diagnosis not present

## 2013-03-31 DIAGNOSIS — H2589 Other age-related cataract: Secondary | ICD-10-CM | POA: Diagnosis not present

## 2013-03-31 DIAGNOSIS — Z880 Allergy status to penicillin: Secondary | ICD-10-CM | POA: Diagnosis not present

## 2013-03-31 DIAGNOSIS — H2510 Age-related nuclear cataract, unspecified eye: Secondary | ICD-10-CM | POA: Diagnosis not present

## 2013-03-31 DIAGNOSIS — H25019 Cortical age-related cataract, unspecified eye: Secondary | ICD-10-CM | POA: Diagnosis not present

## 2013-06-16 ENCOUNTER — Other Ambulatory Visit: Payer: Medicare Other

## 2013-06-17 DIAGNOSIS — Z23 Encounter for immunization: Secondary | ICD-10-CM | POA: Diagnosis not present

## 2013-06-25 ENCOUNTER — Ambulatory Visit: Payer: Medicare Other | Admitting: Family Medicine

## 2013-08-04 ENCOUNTER — Encounter: Payer: Self-pay | Admitting: *Deleted

## 2013-08-21 ENCOUNTER — Ambulatory Visit: Payer: Medicare Other | Admitting: Family Medicine

## 2013-08-25 ENCOUNTER — Encounter: Payer: Self-pay | Admitting: Family Medicine

## 2013-08-25 ENCOUNTER — Ambulatory Visit (INDEPENDENT_AMBULATORY_CARE_PROVIDER_SITE_OTHER): Payer: Medicare Other | Admitting: Family Medicine

## 2013-08-25 VITALS — BP 149/74 | HR 59 | Temp 98.1°F | Ht 68.0 in | Wt 156.0 lb

## 2013-08-25 DIAGNOSIS — I080 Rheumatic disorders of both mitral and aortic valves: Secondary | ICD-10-CM

## 2013-08-25 DIAGNOSIS — Z1211 Encounter for screening for malignant neoplasm of colon: Secondary | ICD-10-CM

## 2013-08-25 DIAGNOSIS — I4891 Unspecified atrial fibrillation: Secondary | ICD-10-CM

## 2013-08-25 DIAGNOSIS — E785 Hyperlipidemia, unspecified: Secondary | ICD-10-CM

## 2013-08-25 NOTE — Progress Notes (Signed)
Patient ID: Kyle [name], male   DOB: 1934/09/26, 78 y.o.   MRN: [id number] SUBJECTIVE: CC: No chief complaint on file.   HPI: Patient is here for follow up of hyperlipidemia/MVR/ history of A fib not on anticoagulation denies Headache;denies Chest Pain;denies weakness;denies Shortness of Breath and orthopnea;denies Visual changes;denies palpitations;denies cough;denies pedal edema;denies symptoms of TIA or stroke;deniesClaudication symptoms. admits to Compliance with medications; denies Problems with medications.   has not seen Dr Antoine Poche in a couple of years. Was to see annually. Doing fine otherwise.  Past Medical History  Diagnosis Date  . CAD (coronary artery disease)     Non obstructive by catheterization Feb 2007 prior to his surgery revealing a 40-50% LAD lesion, good LV function with EF 55-60% by last echocardiogram prior to his surgery  . Mitral regurgitation     S/P mitral valve repair Oct 17, 2005 by Dr. [name]  . Atrial fibrillation     Post operative, maintaining normal sinus rhythm on amiodarone  . Hyperlipidemia     Treated  . Benign prostatic hypertrophy    Past Surgical History  Procedure Laterality Date  . Cardiac catheterization  10/2005  . Mitral valve repair  10/17/2005    Dr. [name]   History   Social History  . Marital Status: Married    Spouse Name: N/A    Number of Children: N/A  . Years of Education: N/A   Occupational History  . Retired from [employer]    Social History Main Topics  . Smoking status: Former Smoker    Types: Cigarettes  . Smokeless tobacco: Never Used  . Alcohol Use: No  . Drug Use: No  . Sexual Activity: Not on file   Other Topics Concern  . Not on file   Social History Narrative   Married   Lives in [place]   Family History  Problem Relation Age of Onset  . Heart disease Neg Hx   . COPD Father   . Cancer Brother     melanoma   Current Outpatient Prescriptions on File Prior to Visit  Medication Sig Dispense  Refill  . aspirin 81 MG tablet Take 81 mg by mouth daily.        . fish oil-omega-3 fatty acids 1000 MG capsule Take 1 capsule by mouth daily.        . Multiple Vitamin (MULTIVITAMIN) tablet Take 1 tablet by mouth daily.        . rosuvastatin (CRESTOR) 10 MG tablet Take 1 tablet (10 mg total) by mouth daily.  30 tablet  5   No current facility-administered medications on file prior to visit.   Allergies  Allergen Reactions  . Penicillins     REACTION: dizziness/syncope   Immunization History  Administered Date(s) Administered  . Influenza, High Dose Seasonal PF 06/26/2013   Prior to Admission medications   Medication Sig Start Date End Date Taking? Authorizing Provider  aspirin 81 MG tablet Take 81 mg by mouth daily.      Historical Provider, MD  fish oil-omega-3 fatty acids 1000 MG capsule Take 1 capsule by mouth daily.      Historical Provider, MD  Multiple Vitamin (MULTIVITAMIN) tablet Take 1 tablet by mouth daily.      Historical Provider, MD  rosuvastatin (CRESTOR) 10 MG tablet Take 1 tablet (10 mg total) by mouth daily. 02/18/13   Ileana Ladd, MD     ROS: As above in the HPI. All other systems are stable or negative.  OBJECTIVE: APPEARANCE:  Patient in no acute distress.The patient appeared well nourished and normally developed. Acyanotic. Waist: VITAL SIGNS:BP 149/74  Pulse 59  Temp(Src) 98.1 F (36.7 C) (Oral)  Ht 5\' 8"  (1.727 m)  Wt 156 lb (70.761 kg)  BMI 23.73 kg/m2 WM   SKIN: warm and  Dry without overt rashes, tattoos and scars  HEAD and Neck: without JVD, Head and scalp: normal Eyes:No scleral icterus. Fundi normal, eye movements normal. Ears: Auricle normal, canal normal, Tympanic membranes normal, insufflation normal. Nose: normal Throat: normal Neck & thyroid: normal  CHEST & LUNGS: Chest wall: normal Lungs: Clear  CVS: Reveals the PMI to be normally located. Regular rhythm, First and Second Heart sounds are normal,  1/6 early short  systolic murmur,no rubs or gallops. Peripheral vasculature: Radial pulses: normal Dorsal pedis pulses: normal Posterior pulses: normal  ABDOMEN:  Appearance: normal Benign, no organomegaly, no masses, no Abdominal Aortic enlargement. No Guarding , no rebound. No Bruits. Bowel sounds: normal  RECTAL: N/A GU: N/A  EXTREMETIES: nonedematous.  MUSCULOSKELETAL:  Spine: normal Joints: intact  NEUROLOGIC: oriented to time,place and person; nonfocal. Strength is normal Sensory is normal Reflexes are normal Cranial Nerves are normal.  ASSESSMENT:  Atrial fibrillation - Plan: Ambulatory referral to Cardiology  HYPERLIPIDEMIA - Plan: CMP14+EGFR, NMR, lipoprofile  MITRAL REGURGITATION - Plan: Ambulatory referral to Cardiology  Screen for colon cancer - Plan: Ambulatory referral to Gastroenterology  PLAN: Needs colonoscopy for screening.  Needs follo wup with Cardiology.  Orders Placed This Encounter  Procedures  . CMP14+EGFR  . NMR, lipoprofile  . Ambulatory referral to Cardiology    Referral Priority:  Routine    Referral Type:  Consultation    Referral Reason:  Specialty Services Required    Requested Specialty:  Cardiology    Number of Visits Requested:  1  . Ambulatory referral to Gastroenterology    Referral Priority:  Routine    Referral Type:  Consultation    Referral Reason:  Specialty Services Required    Requested Specialty:  Gastroenterology    Number of Visits Requested:  1  plant based diet since he is not on coumadin.  No orders of the defined types were placed in this encounter.   There are no discontinued medications. Return in about 4 months (around 12/24/2013) for Recheck medical problems.  Aiko Belko P. Modesto Charon, [name]

## 2013-08-27 LAB — CMP14+EGFR
ALT: 8 IU/L (ref 0–44)
AST: 16 IU/L (ref 0–40)
Albumin/Globulin Ratio: 2.3 (ref 1.1–2.5)
Albumin: 4.5 g/dL (ref 3.5–4.8)
Alkaline Phosphatase: 49 IU/L (ref 39–117)
BUN/Creatinine Ratio: 12 (ref 10–22)
BUN: 9 mg/dL (ref 8–27)
CO2: 26 mmol/L (ref 18–29)
Calcium: 9.4 mg/dL (ref 8.6–10.2)
Chloride: 99 mmol/L (ref 97–108)
Creatinine, Ser: 0.76 mg/dL (ref 0.76–1.27)
GFR calc Af Amer: 102 mL/min/{1.73_m2} (ref >59)
GFR calc non Af Amer: 89 mL/min/{1.73_m2} (ref >59)
Globulin, Total: 2 g/dL (ref 1.5–4.5)
Glucose: 96 mg/dL (ref 65–99)
Potassium: 4.7 mmol/L (ref 3.5–5.2)
Sodium: 138 mmol/L (ref 134–144)
Total Bilirubin: 0.5 mg/dL (ref 0.0–1.2)
Total Protein: 6.5 g/dL (ref 6.0–8.5)

## 2013-08-27 LAB — NMR, LIPOPROFILE
Cholesterol: 182 mg/dL (ref <200)
HDL Cholesterol by NMR: 49 mg/dL (ref >=40)
HDL Particle Number: 32.2 umol/L (ref >=30.5)
LDL Particle Number: 1248 nmol/L — ABNORMAL HIGH (ref <1000)
LDL Size: 20.8 nm (ref >20.5)
LDLC SERPL CALC-MCNC: 108 mg/dL — ABNORMAL HIGH (ref <100)
LP-IR Score: 35 (ref <=45)
Small LDL Particle Number: 550 nmol/L — ABNORMAL HIGH (ref <=527)
Triglycerides by NMR: 126 mg/dL (ref <150)

## 2013-08-31 ENCOUNTER — Other Ambulatory Visit: Payer: Self-pay | Admitting: Family Medicine

## 2013-08-31 DIAGNOSIS — E785 Hyperlipidemia, unspecified: Secondary | ICD-10-CM

## 2013-08-31 MED ORDER — ROSUVASTATIN CALCIUM 20 MG PO TABS: 20.0000 mg | ORAL_TABLET | Freq: Every day | ORAL | Status: DC

## 2013-09-12 ENCOUNTER — Encounter: Payer: Self-pay | Admitting: *Deleted

## 2013-10-01 ENCOUNTER — Ambulatory Visit (INDEPENDENT_AMBULATORY_CARE_PROVIDER_SITE_OTHER): Payer: Medicare Other | Admitting: Cardiology

## 2013-10-01 ENCOUNTER — Encounter: Payer: Self-pay | Admitting: Cardiology

## 2013-10-01 VITALS — BP 136/75 | HR 56 | Ht 68.0 in | Wt 158.0 lb

## 2013-10-01 DIAGNOSIS — I4891 Unspecified atrial fibrillation: Secondary | ICD-10-CM

## 2013-10-01 DIAGNOSIS — I080 Rheumatic disorders of both mitral and aortic valves: Secondary | ICD-10-CM | POA: Diagnosis not present

## 2013-10-01 NOTE — Patient Instructions (Signed)
The current medical regimen is effective;  continue present plan and medications.  Your physician has requested that you have an echocardiogram. Echocardiography is a painless test that uses sound waves to create images of your heart. It provides your doctor with information about the size and shape of your heart and how well your heart's chambers and valves are working. This procedure takes approximately one hour. There are no restrictions for this procedure.  Follow up in 2 years with Dr. Hochrein.  You will receive a letter in the mail 2 months before you are due.  Please call us when you receive this letter to schedule your follow up appointment.  

## 2013-10-01 NOTE — Progress Notes (Signed)
    HPI The patient presents for followup of mitral repair. He has been a couple of years since I last saw him. He has done well and denies any cardiovascular symptoms. The patient denies any new symptoms such as chest discomfort, neck or arm discomfort. There has been no new shortness of breath, PND or orthopnea. There have been no reported palpitations, presyncope or syncope.  He stays active.  He cares for his wife who is is paralyzed.  He also does yard work and walking.   Allergies  Allergen Reactions  . Penicillins     REACTION: dizziness/syncope    Current Outpatient Prescriptions  Medication Sig Dispense Refill  . aspirin 81 MG tablet Take 81 mg by mouth daily.        . fish oil-omega-3 fatty acids 1000 MG capsule Take 1 capsule by mouth daily.        . Multiple Vitamin (MULTIVITAMIN) tablet Take 1 tablet by mouth daily.        . rosuvastatin (CRESTOR) 20 MG tablet Take 1 tablet (20 mg total) by mouth daily.  30 tablet  5   No current facility-administered medications for this visit.    Past Medical History  Diagnosis Date  . CAD (coronary artery disease)     Non obstructive by catheterization Feb 2007 prior to his surgery revealing a 40-50% LAD lesion, good LV function with EF 55-60% by last echocardiogram prior to his surgery  . Mitral regurgitation     S/P mitral valve repair Oct 17, 2005 by Dr. Roxy Manns  . Atrial fibrillation     Post operative, maintaining normal sinus rhythm on amiodarone  . Hyperlipidemia     Treated  . Benign prostatic hypertrophy     Past Surgical History  Procedure Laterality Date  . Cardiac catheterization  10/2005  . Mitral valve repair  10/17/2005    Dr. Roxy Manns    ROS:  As stated in the HPI and negative for all other systems.  PHYSICAL EXAM BP 136/75  Pulse 56  Ht 5\' 8"  (1.727 m)  Wt 158 lb (71.668 kg)  BMI 24.03 kg/m2 GENERAL:  Well appearing NECK:  No jugular venous distention, waveform within normal limits, carotid upstroke brisk and  symmetric, no bruits, no thyromegaly LYMPHATICS:  No cervical, inguinal adenopathy LUNGS:  Clear to auscultation bilaterally CHEST:  Well healed sternotomy scar HEART:  PMI not displaced or sustained,S1 and S2 within normal limits, no S3, no S4, no clicks, no rubs, no murmurs ABD:  Flat, positive bowel sounds normal in frequency in pitch, no bruits, no rebound, no guarding, no midline pulsatile mass, no hepatomegaly, no splenomegaly EXT:  2 plus pulses throughout, no edema, no cyanosis no clubbing  EKG:  Sinus bradycardia, rate 56, leftward axis, poor anterior R wave progression, no ST-T wave changes. 10/01/2013   ASSESSMENT AND PLAN  STATUS POST MITRAL VALVE REPAIR:  I will check an echocardiogram as it's been 2-1/2 years since the previous. If this is stable no change in therapy or further evaluation will be indicated.  CAD:  This was nonobstructive.  He has no symptoms related to this. No further evaluation is planned. He needs primary risk reduction.  ATRIAL FIBRILLATION:  He has had no symptomatic recurrence of this. No change in therapy or further evaluation is warranted.

## 2013-10-20 ENCOUNTER — Encounter: Payer: Self-pay | Admitting: Cardiology

## 2013-10-20 ENCOUNTER — Ambulatory Visit (HOSPITAL_COMMUNITY): Payer: Medicare Other | Attending: Cardiology | Admitting: Cardiology

## 2013-10-20 DIAGNOSIS — I080 Rheumatic disorders of both mitral and aortic valves: Secondary | ICD-10-CM

## 2013-10-20 DIAGNOSIS — I2510 Atherosclerotic heart disease of native coronary artery without angina pectoris: Secondary | ICD-10-CM | POA: Insufficient documentation

## 2013-10-20 DIAGNOSIS — E785 Hyperlipidemia, unspecified: Secondary | ICD-10-CM | POA: Insufficient documentation

## 2013-10-20 DIAGNOSIS — I079 Rheumatic tricuspid valve disease, unspecified: Secondary | ICD-10-CM | POA: Insufficient documentation

## 2013-10-20 DIAGNOSIS — I059 Rheumatic mitral valve disease, unspecified: Secondary | ICD-10-CM | POA: Insufficient documentation

## 2013-10-20 DIAGNOSIS — I4891 Unspecified atrial fibrillation: Secondary | ICD-10-CM | POA: Insufficient documentation

## 2013-10-20 DIAGNOSIS — I359 Nonrheumatic aortic valve disorder, unspecified: Secondary | ICD-10-CM | POA: Diagnosis not present

## 2013-10-20 NOTE — Progress Notes (Signed)
Echo performed. 

## 2013-12-25 ENCOUNTER — Ambulatory Visit (INDEPENDENT_AMBULATORY_CARE_PROVIDER_SITE_OTHER): Payer: Medicare Other | Admitting: Family Medicine

## 2013-12-25 ENCOUNTER — Encounter: Payer: Self-pay | Admitting: Family Medicine

## 2013-12-25 VITALS — BP 133/77 | HR 55 | Temp 97.1°F | Ht 68.0 in | Wt 159.6 lb

## 2013-12-25 DIAGNOSIS — I080 Rheumatic disorders of both mitral and aortic valves: Secondary | ICD-10-CM | POA: Diagnosis not present

## 2013-12-25 DIAGNOSIS — Z8679 Personal history of other diseases of the circulatory system: Secondary | ICD-10-CM | POA: Diagnosis not present

## 2013-12-25 DIAGNOSIS — E785 Hyperlipidemia, unspecified: Secondary | ICD-10-CM

## 2013-12-25 NOTE — Progress Notes (Signed)
Patient ID: Kyle [name], male   DOB: 1934-09-13, 79 y.o.   MRN: [id number] SUBJECTIVE: CC: Chief Complaint  Patient presents with  . Follow-up    4 month follow up     HPI: Patient is here for follow up of hyperlipidemia/CAD/Atrial Fibrillation/BPH: denies Headache;denies Chest Pain;denies weakness;denies Shortness of Breath and orthopnea;denies Visual changes;denies palpitations;denies cough;denies pedal edema;denies symptoms of TIA or stroke;deniesClaudication symptoms. admits to Compliance with medications; denies Problems with medications.  Feels good  Doing well.  Past Medical History  Diagnosis Date  . CAD (coronary artery disease)     Non obstructive by catheterization Feb 2007 prior to his surgery revealing a 40-50% LAD lesion, good LV function with EF 55-60% by last echocardiogram prior to his surgery  . Mitral regurgitation     S/P mitral valve repair Oct 17, 2005 by Dr. Roxy Manns  . Atrial fibrillation     Post operative, maintaining normal sinus rhythm on amiodarone  . Hyperlipidemia     Treated  . Benign prostatic hypertrophy    Past Surgical History  Procedure Laterality Date  . Cardiac catheterization  10/2005  . Mitral valve repair  10/17/2005    Dr. Roxy Manns   History   Social History  . Marital Status: Married    Spouse Name: N/A    Number of Children: N/A  . Years of Education: N/A   Occupational History  . Retired from [employer]    Social History Main Topics  . Smoking status: Former Smoker    Types: Cigarettes  . Smokeless tobacco: Never Used  . Alcohol Use: No  . Drug Use: No  . Sexual Activity: Not on file   Other Topics Concern  . Not on file   Social History Narrative   Married   Lives in [place]   Family History  Problem Relation Age of Onset  . Heart disease Neg Hx   . COPD Father   . Cancer Brother     melanoma   Current Outpatient Prescriptions on File Prior to Visit  Medication Sig Dispense Refill  . aspirin 81 MG tablet  Take 81 mg by mouth daily.        . fish oil-omega-3 fatty acids 1000 MG capsule Take 1 capsule by mouth daily.        . Multiple Vitamin (MULTIVITAMIN) tablet Take 1 tablet by mouth daily.        . rosuvastatin (CRESTOR) 20 MG tablet Take 1 tablet (20 mg total) by mouth daily.  30 tablet  5   No current facility-administered medications on file prior to visit.   Allergies  Allergen Reactions  . Penicillins     REACTION: dizziness/syncope   Immunization History  Administered Date(s) Administered  . Influenza, High Dose Seasonal PF 06/26/2013  . Pneumococcal Polysaccharide-23 05/26/2011  . Tdap 05/13/2011  . Zoster 08/19/2012   Prior to Admission medications   Medication Sig Start Date End Date Taking? Authorizing Provider  aspirin 81 MG tablet Take 81 mg by mouth daily.     Yes Historical Provider, MD  fish oil-omega-3 fatty acids 1000 MG capsule Take 1 capsule by mouth daily.     Yes Historical Provider, MD  Multiple Vitamin (MULTIVITAMIN) tablet Take 1 tablet by mouth daily.     Yes Historical Provider, MD  rosuvastatin (CRESTOR) 20 MG tablet Take 1 tablet (20 mg total) by mouth daily. 08/31/13  Yes Vernie Shanks, MD     ROS: As above in the HPI. All  other systems are stable or negative.  OBJECTIVE: APPEARANCE:  Patient in no acute distress.The patient appeared well nourished and normally developed. Acyanotic. Waist: VITAL SIGNS:BP 133/77  Pulse 55  Temp(Src) 97.1 F (36.2 C) (Oral)  Ht 5' 8"  (1.727 m)  Wt 159 lb 9.6 oz (72.394 kg)  BMI 24.27 kg/m2 WM   SKIN: warm and  Dry without overt rashes, tattoos and scars  HEAD and Neck: without JVD, Head and scalp: normal Eyes:No scleral icterus. Fundi normal, eye movements normal. Ears: Auricle normal, canal normal, Tympanic membranes normal, insufflation normal. Nose: normal Throat: normal Neck & thyroid: normal  CHEST & LUNGS: Chest wall: normal Lungs: Clear  CVS: Reveals the PMI to be normally  located. Regular rhythm, no a fib present. Last EKG was SB. First and Second Heart sounds are normal,  2/6 systolic  murmurs, no rubs or gallops. Peripheral vasculature: Radial pulses: normal Dorsal pedis pulses: normal Posterior pulses: normal  ABDOMEN:  Appearance: normal Benign, no organomegaly, no masses, no Abdominal Aortic enlargement. No Guarding , no rebound. No Bruits. Bowel sounds: normal  RECTAL: N/A GU: N/A  EXTREMETIES: nonedematous.  MUSCULOSKELETAL:  Spine: normal Joints: intact  NEUROLOGIC: oriented to time,place and person; nonfocal. Strength is normal Sensory is normal Reflexes are normal Cranial Nerves are normal. Results for orders placed in visit on 08/25/13  CMP14+EGFR      Result Value Ref Range   Glucose 96  65 - 99 mg/dL   BUN 9  8 - 27 mg/dL   Creatinine, Ser 0.76  0.76 - 1.27 mg/dL   GFR calc non Af Amer 89  >59 mL/min/1.73   GFR calc Af Amer 102  >59 mL/min/1.73   BUN/Creatinine Ratio 12  10 - 22   Sodium 138  134 - 144 mmol/L   Potassium 4.7  3.5 - 5.2 mmol/L   Chloride 99  97 - 108 mmol/L   CO2 26  18 - 29 mmol/L   Calcium 9.4  8.6 - 10.2 mg/dL   Total Protein 6.5  6.0 - 8.5 g/dL   Albumin 4.5  3.5 - 4.8 g/dL   Globulin, Total 2.0  1.5 - 4.5 g/dL   Albumin/Globulin Ratio 2.3  1.1 - 2.5   Total Bilirubin 0.5  0.0 - 1.2 mg/dL   Alkaline Phosphatase 49  39 - 117 IU/L   AST 16  0 - 40 IU/L   ALT 8  0 - 44 IU/L  NMR, LIPOPROFILE      Result Value Ref Range   LDL Particle Number [id number] (*) <1000 nmol/L   LDLC SERPL CALC-MCNC 108 (*) <100 mg/dL   HDL Cholesterol by NMR 49  >=40 mg/dL   Triglycerides by NMR 126  <150 mg/dL   Cholesterol 182  <200 mg/dL   HDL Particle Number 32.2  >=30.5 umol/L   Small LDL Particle Number 550 (*) <=527 nmol/L   LDL Size 20.8  >20.5 nm   LP-IR Score 35  <=45    ASSESSMENT:  HYPERLIPIDEMIA - Plan: CMP14+EGFR, Lipid panel  MITRAL REGURGITATION  History of atrial fibrillation without current  medication - in sinus Bradycardia  PLAN:  Handout in the AVS Orders Placed This Encounter  Procedures  . CMP14+EGFR  . Lipid panel   No orders of the defined types were placed in this encounter.   There are no discontinued medications. Return in about 6 months (around 06/26/2014) for Recheck medical problems.  Timberlee Roblero P. Jacelyn Grip, [name]

## 2013-12-25 NOTE — Patient Instructions (Signed)

## 2013-12-26 LAB — CMP14+EGFR
ALT: 8 IU/L (ref 0–44)
AST: 14 IU/L (ref 0–40)
Albumin/Globulin Ratio: 2.3 (ref 1.1–2.5)
Albumin: 4.5 g/dL (ref 3.5–4.8)
Alkaline Phosphatase: 42 IU/L (ref 39–117)
BUN/Creatinine Ratio: 13 (ref 10–22)
BUN: 10 mg/dL (ref 8–27)
CO2: 28 mmol/L (ref 18–29)
Calcium: 9.2 mg/dL (ref 8.6–10.2)
Chloride: 100 mmol/L (ref 97–108)
Creatinine, Ser: 0.75 mg/dL — ABNORMAL LOW (ref 0.76–1.27)
GFR calc Af Amer: 102 mL/min/{1.73_m2} (ref >59)
GFR calc non Af Amer: 88 mL/min/{1.73_m2} (ref >59)
Globulin, Total: 2 g/dL (ref 1.5–4.5)
Glucose: 92 mg/dL (ref 65–99)
Potassium: 4.7 mmol/L (ref 3.5–5.2)
Sodium: 142 mmol/L (ref 134–144)
Total Bilirubin: 0.5 mg/dL (ref 0.0–1.2)
Total Protein: 6.5 g/dL (ref 6.0–8.5)

## 2013-12-26 LAB — LIPID PANEL
Chol/HDL Ratio: 4.3 ratio units (ref 0.0–5.0)
Cholesterol, Total: 199 mg/dL (ref 100–199)
HDL: 46 mg/dL (ref >39)
LDL Calculated: 132 mg/dL — ABNORMAL HIGH (ref 0–99)
Triglycerides: 106 mg/dL (ref 0–149)
VLDL Cholesterol Cal: 21 mg/dL (ref 5–40)

## 2013-12-28 ENCOUNTER — Other Ambulatory Visit: Payer: Self-pay | Admitting: Family Medicine

## 2013-12-28 DIAGNOSIS — E785 Hyperlipidemia, unspecified: Secondary | ICD-10-CM

## 2013-12-28 MED ORDER — ROSUVASTATIN CALCIUM 40 MG PO TABS: 40.0000 mg | ORAL_TABLET | Freq: Every day | ORAL | Status: DC

## 2014-01-04 ENCOUNTER — Other Ambulatory Visit: Payer: Self-pay | Admitting: Family Medicine

## 2014-01-04 DIAGNOSIS — E785 Hyperlipidemia, unspecified: Secondary | ICD-10-CM

## 2014-01-04 MED ORDER — ROSUVASTATIN CALCIUM 10 MG PO TABS: 10.0000 mg | ORAL_TABLET | Freq: Every day | ORAL | Status: DC

## 2014-01-05 ENCOUNTER — Telehealth: Payer: Self-pay | Admitting: Family Medicine

## 2014-01-05 NOTE — Telephone Encounter (Signed)
Message copied by Waverly Ferrari on Mon Jan 05, 2014  3:54 PM ------      Message from: Vernie Shanks      Created: Sun Jan 04, 2014  4:27 PM       Didn't know he was alternating between 5 and 10 mg. I increased from our charting of 20 mg to 40 mg. He will therefore need to take 10 mg daily.      i will change the order in EPIC. ------

## 2014-01-19 DIAGNOSIS — H35379 Puckering of macula, unspecified eye: Secondary | ICD-10-CM | POA: Diagnosis not present

## 2014-01-23 DIAGNOSIS — H35379 Puckering of macula, unspecified eye: Secondary | ICD-10-CM | POA: Diagnosis not present

## 2014-01-26 DIAGNOSIS — D485 Neoplasm of uncertain behavior of skin: Secondary | ICD-10-CM | POA: Diagnosis not present

## 2014-01-26 DIAGNOSIS — T07XXXA Unspecified multiple injuries, initial encounter: Secondary | ICD-10-CM | POA: Diagnosis not present

## 2014-01-26 DIAGNOSIS — C44319 Basal cell carcinoma of skin of other parts of face: Secondary | ICD-10-CM | POA: Diagnosis not present

## 2014-01-26 DIAGNOSIS — L299 Pruritus, unspecified: Secondary | ICD-10-CM | POA: Diagnosis not present

## 2014-01-26 DIAGNOSIS — L570 Actinic keratosis: Secondary | ICD-10-CM | POA: Diagnosis not present

## 2014-02-05 ENCOUNTER — Telehealth: Payer: Self-pay | Admitting: *Deleted

## 2014-02-05 DIAGNOSIS — E785 Hyperlipidemia, unspecified: Secondary | ICD-10-CM

## 2014-02-05 MED ORDER — ROSUVASTATIN CALCIUM 10 MG PO TABS: 10.0000 mg | ORAL_TABLET | Freq: Every day | ORAL | Status: DC

## 2014-02-05 NOTE — Telephone Encounter (Signed)
Patient requesting samples of crestor

## 2014-04-14 ENCOUNTER — Telehealth: Payer: Self-pay | Admitting: Family Medicine

## 2014-04-14 NOTE — Telephone Encounter (Signed)
Please try Lipitor 10 mg generic one daily. Check liver function tests in 4 weeks. Recheck lipid panel in 3 months discontinue Crestor .

## 2014-04-14 NOTE — Telephone Encounter (Signed)
  Patient is aware they are going to hold off on the Lipitor and just pay for the crestor they are going to call there insurance company

## 2014-05-20 DIAGNOSIS — C44319 Basal cell carcinoma of skin of other parts of face: Secondary | ICD-10-CM | POA: Diagnosis not present

## 2014-05-27 DIAGNOSIS — Z483 Aftercare following surgery for neoplasm: Secondary | ICD-10-CM | POA: Diagnosis not present

## 2014-05-27 DIAGNOSIS — C44319 Basal cell carcinoma of skin of other parts of face: Secondary | ICD-10-CM | POA: Diagnosis not present

## 2014-05-27 DIAGNOSIS — Z4802 Encounter for removal of sutures: Secondary | ICD-10-CM | POA: Diagnosis not present

## 2014-05-28 ENCOUNTER — Encounter: Payer: Self-pay | Admitting: Gastroenterology

## 2014-06-12 ENCOUNTER — Encounter: Payer: Self-pay | Admitting: Gastroenterology

## 2014-06-15 DIAGNOSIS — Z23 Encounter for immunization: Secondary | ICD-10-CM | POA: Diagnosis not present

## 2014-06-24 ENCOUNTER — Ambulatory Visit (INDEPENDENT_AMBULATORY_CARE_PROVIDER_SITE_OTHER): Payer: Medicare Other | Admitting: Family Medicine

## 2014-06-24 ENCOUNTER — Encounter: Payer: Self-pay | Admitting: Family Medicine

## 2014-06-24 VITALS — BP 135/77 | HR 59 | Temp 96.8°F | Ht 68.0 in | Wt 153.0 lb

## 2014-06-24 DIAGNOSIS — Z1211 Encounter for screening for malignant neoplasm of colon: Secondary | ICD-10-CM

## 2014-06-24 DIAGNOSIS — E785 Hyperlipidemia, unspecified: Secondary | ICD-10-CM

## 2014-06-24 MED ORDER — ATORVASTATIN CALCIUM 20 MG PO TABS: 20.0000 mg | ORAL_TABLET | Freq: Every day | ORAL | Status: DC

## 2014-06-24 NOTE — Progress Notes (Signed)
   Subjective:    Patient ID: Kyle [name], male    DOB: Apr 29, 1935, 79 y.o.   MRN: [id number]  HPI 79 year old gentleman here to followup hyperlipidemia. He also has a history of atrial fibrillation with mitral and aortic valve insufficiency. He tells me he had a patch applied to his mitral valve. He is followed by cardiology. He takes a baby aspirin a day as an anticoagulant. He denies any palpitations, syncope, or chest pain. He stays at home most of the time because his wife is paralyzed and he is the primary caregiver. He does have some help one day a week. He is concerned about the expense of Crestor and expresses an interest in taking a generic statin. He had formerly been on Lipitor which was well tolerated.    Review of Systems  Constitutional: Negative.   HENT: Negative.   Eyes: Negative.   Respiratory: Negative.  Negative for shortness of breath.   Cardiovascular: Negative.  Negative for chest pain and leg swelling.  Gastrointestinal: Negative.   Genitourinary: Negative.   Musculoskeletal: Negative.   Skin: Negative.   Neurological: Negative.   Psychiatric/Behavioral: Negative.   All other systems reviewed and are negative.      Objective:   Physical Exam  Constitutional: He is oriented to person, place, and time. He appears well-developed and well-nourished.  HENT:  Head: Normocephalic.  Right Ear: External ear normal.  Left Ear: External ear normal.  Nose: Nose normal.  Mouth/Throat: Oropharynx is clear and moist.  Eyes: Conjunctivae and EOM are normal. Pupils are equal, round, and reactive to light.  Neck: Normal range of motion. Neck supple.  Cardiovascular: Normal rate, regular rhythm, normal heart sounds and intact distal pulses.   Pulmonary/Chest: Effort normal and breath sounds normal.  Abdominal: Soft. Bowel sounds are normal.  Musculoskeletal: Normal range of motion.  Neurological: He is alert and oriented to person, place, and time.  Skin: Skin is warm  and dry.  Psychiatric: He has a normal mood and affect. His behavior is normal. Judgment and thought content normal.    BP 135/77  Pulse 59  Temp(Src) 96.8 F (36 C) (Oral)  Ht 5\' 8"  (1.727 m)  Wt 153 lb (69.4 kg)  BMI 23.27 kg/m2      Assessment & Plan:  1. Hyperlipidemia LDL not at goal.  Change to generic atorvastatin 20 mg Recheck lipoids in 3 months  2. Colon cancer screening  - Ambulatory referral to Gastroenterology  Wardell Honour MD

## 2014-06-25 ENCOUNTER — Encounter: Payer: Self-pay | Admitting: Gastroenterology

## 2014-07-07 DIAGNOSIS — Z4889 Encounter for other specified surgical aftercare: Secondary | ICD-10-CM | POA: Diagnosis not present

## 2014-08-12 ENCOUNTER — Ambulatory Visit (AMBULATORY_SURGERY_CENTER): Payer: Self-pay | Admitting: *Deleted

## 2014-08-12 VITALS — Ht 67.0 in | Wt 159.2 lb

## 2014-08-12 DIAGNOSIS — Z86010 Personal history of colonic polyps: Secondary | ICD-10-CM

## 2014-08-12 MED ORDER — NA SULFATE-K SULFATE-MG SULF 17.5-3.13-1.6 GM/177ML PO SOLN: 1.0000 | Freq: Once | ORAL | Status: DC

## 2014-08-12 NOTE — Progress Notes (Signed)
No egg or soy allergy. ewm No diet pills. ewm No home 02 use. ewm No issues with past sedation. ewm

## 2014-08-26 ENCOUNTER — Encounter: Payer: Self-pay | Admitting: Gastroenterology

## 2014-08-26 ENCOUNTER — Ambulatory Visit (AMBULATORY_SURGERY_CENTER): Payer: Medicare Other | Admitting: Gastroenterology

## 2014-08-26 VITALS — BP 134/70 | HR 52 | Temp 97.5°F | Resp 22 | Ht 67.0 in | Wt 159.0 lb

## 2014-08-26 DIAGNOSIS — Z1211 Encounter for screening for malignant neoplasm of colon: Secondary | ICD-10-CM | POA: Diagnosis not present

## 2014-08-26 DIAGNOSIS — Z86010 Personal history of colonic polyps: Secondary | ICD-10-CM | POA: Diagnosis not present

## 2014-08-26 DIAGNOSIS — K5730 Diverticulosis of large intestine without perforation or abscess without bleeding: Secondary | ICD-10-CM

## 2014-08-26 MED ORDER — SODIUM CHLORIDE 0.9 % IV SOLN: 500.0000 mL | INTRAVENOUS | Status: DC

## 2014-08-26 NOTE — Patient Instructions (Signed)
Discharge instructions given. Handouts on diverticulosis and a high fiber diet. Resume previous medications. YOU HAD AN ENDOSCOPIC PROCEDURE TODAY AT THE [facility]: Refer to the procedure report that was given to you for any specific questions about what was found during the examination.  If the procedure report does not answer your questions, please call your gastroenterologist to clarify.  If you requested that your care partner not be given the details of your procedure findings, then the procedure report has been included in a sealed envelope for you to review at your convenience later.  YOU SHOULD EXPECT: Some feelings of bloating in the abdomen. Passage of more gas than usual.  Walking can help get rid of the air that was put into your GI tract during the procedure and reduce the bloating. If you had a lower endoscopy (such as a colonoscopy or flexible sigmoidoscopy) you may notice spotting of blood in your stool or on the toilet paper. If you underwent a bowel prep for your procedure, then you may not have a normal bowel movement for a few days.  DIET: Your first meal following the procedure should be a light meal and then it is ok to progress to your normal diet.  A half-sandwich or bowl of soup is an example of a good first meal.  Heavy or fried foods are harder to digest and may make you feel nauseous or bloated.  Likewise meals heavy in dairy and vegetables can cause extra gas to form and this can also increase the bloating.  Drink plenty of fluids but you should avoid alcoholic beverages for 24 hours.  ACTIVITY: Your care partner should take you home directly after the procedure.  You should plan to take it easy, moving slowly for the rest of the day.  You can resume normal activity the day after the procedure however you should NOT DRIVE or use heavy machinery for 24 hours (because of the sedation medicines used during the test).    SYMPTOMS TO REPORT IMMEDIATELY: A  gastroenterologist can be reached at any hour.  During normal business hours, 8:30 AM to 5:00 PM Monday through Friday, call [phone].  After hours and on weekends, please call the GI answering service at [phone] who will take a message and have the physician on call contact you.   Following lower endoscopy (colonoscopy or flexible sigmoidoscopy):  Excessive amounts of blood in the stool  Significant tenderness or worsening of abdominal pains  Swelling of the abdomen that is new, acute  Fever of 100F or higher  FOLLOW UP: If any biopsies were taken you will be contacted by phone or by letter within the next 1-3 weeks.  Call your gastroenterologist if you have not heard about the biopsies in 3 weeks.  Our staff will call the home number listed on your records the next business day following your procedure to check on you and address any questions or concerns that you may have at that time regarding the information given to you following your procedure. This is a courtesy call and so if there is no answer at the home number and we have not heard from you through the emergency physician on call, we will assume that you have returned to your regular daily activities without incident.  SIGNATURES/CONFIDENTIALITY: You and/or your care partner have signed paperwork which will be entered into your electronic medical record.  These signatures attest to the fact that that the information above on your After Visit Summary   has been reviewed and is understood.  Full responsibility of the confidentiality of this discharge information lies with you and/or your care-partner. 

## 2014-08-26 NOTE — Op Note (Signed)
[facility]  [address] [place], [id number]   COLONOSCOPY PROCEDURE REPORT  PATIENT: Kyle [name], Kyle [name]  MR#: [id number] BIRTHDATE: 06-Oct-1934 , 79  yrs. old GENDER: male ENDOSCOPIST: Inda Castle, MD REFERRED QM:[id number] Laurance Flatten, [name] PROCEDURE DATE:  08/26/2014 PROCEDURE:   Colonoscopy, diagnostic First Screening Colonoscopy - Avg.  risk and is 50 yrs.  old or older - No.  Prior Negative Screening - Now for repeat screening. N/A  History of Adenoma - Now for follow-up colonoscopy & has been > or = to 3 yrs.  Yes hx of adenoma.  Has been 3 or more years since last colonoscopy.  Polyps Removed Today? No.  Recommend repeat exam, <10 yrs? No. ASA CLASS:   Class II INDICATIONS:high risk personal history of colonic polyps. 2009 MEDICATIONS: Monitored anesthesia care and Propofol 160 mg IV  DESCRIPTION OF PROCEDURE:   After the risks benefits and alternatives of the procedure were thoroughly explained, informed consent was obtained.  The digital rectal exam revealed no abnormalities of the rectum.   The [id number]  endoscope was introduced through the anus and advanced to the cecum, which was identified by both the appendix and ileocecal valve. No adverse events experienced.   The quality of the prep was excellent using Suprep  The instrument was then slowly withdrawn as the colon was fully examined.      COLON FINDINGS: There was moderate diverticulosis noted in the descending colon and sigmoid colon with associated muscular hypertrophy.   The examination was otherwise normal.  Retroflexed views revealed no abnormalities. The time to cecum=1 minutes 34 seconds.  Withdrawal time=8 minutes 10 seconds.  The scope was withdrawn and the procedure completed. COMPLICATIONS: There were no immediate complications.  ENDOSCOPIC IMPRESSION: 1.   There was moderate diverticulosis noted in the descending colon and sigmoid colon 2.   The examination was otherwise  normal  RECOMMENDATIONS: Given your age, you will not need another colonoscopy for colon cancer screening or polyp surveillance.  These types of tests usually stop around the age 25.  eSigned:  Inda Castle, MD 08/26/2014 2:15 PM   cc:

## 2014-08-26 NOTE — Progress Notes (Signed)
A/ox3 pleased with MAC, report to celia RN 

## 2014-08-27 ENCOUNTER — Telehealth: Payer: Self-pay | Admitting: *Deleted

## 2014-08-27 NOTE — Telephone Encounter (Signed)
  Follow up Call-  Call back number 08/26/2014  Post procedure Call Back phone  # [phone]  Permission to leave phone message Yes     Patient questions:  Do you have a fever, pain , or abdominal swelling? No. Pain Score  0 *  Have you tolerated food without any problems? Yes.    Have you been able to return to your normal activities? Yes.    Do you have any questions about your discharge instructions: Diet   No. Medications  No. Follow up visit  No.  Do you have questions or concerns about your Care? No.  Actions: * If pain score is 4 or above: No action needed, pain <4.

## 2014-10-07 ENCOUNTER — Ambulatory Visit (INDEPENDENT_AMBULATORY_CARE_PROVIDER_SITE_OTHER): Payer: Medicare Other | Admitting: Family Medicine

## 2014-10-07 ENCOUNTER — Encounter: Payer: Self-pay | Admitting: Family Medicine

## 2014-10-07 VITALS — BP 141/78 | HR 60 | Temp 97.1°F | Ht 67.0 in | Wt 157.0 lb

## 2014-10-07 DIAGNOSIS — E785 Hyperlipidemia, unspecified: Secondary | ICD-10-CM

## 2014-10-07 NOTE — Progress Notes (Signed)
   Subjective:    Patient ID: Kyle [name], male    DOB: 04/27/1935, 80 y.o.   MRN: [id number]  HPI  80 year old male comes in today to follow up on hyperlipidemia. Tolerating atorvastatin on every other day schedule. He has been on this for 3 months. He denies palpitations. He is on baby aspirin as an anticoagulant. He gets up several times at night to urinate but he also turns his wife is paralyzed when he gets up.  Patient Active Problem List   Diagnosis Date Noted  . Hyperlipidemia 05/07/2009  . MITRAL REGURGITATION 05/07/2009  . Atrial fibrillation 05/07/2009   Outpatient Encounter Prescriptions as of 10/07/2014  Medication Sig  . aspirin 81 MG tablet Take 81 mg by mouth daily.    Marland Kitchen atorvastatin (LIPITOR) 20 MG tablet Take 1 tablet (20 mg total) by mouth daily.  . fish oil-omega-3 fatty acids 1000 MG capsule Take 1 capsule by mouth daily.    . Multiple Vitamin (MULTIVITAMIN) tablet Take 1 tablet by mouth daily.       Review of Systems  Constitutional: Negative.   HENT: Negative.   Eyes: Negative.   Respiratory: Negative.  Negative for shortness of breath.   Cardiovascular: Negative.  Negative for chest pain and leg swelling.  Gastrointestinal: Negative.   Genitourinary: Negative.   Musculoskeletal: Negative.   Skin: Negative.   Neurological: Negative.   Psychiatric/Behavioral: Negative.   All other systems reviewed and are negative.      Objective:   Physical Exam  Constitutional: He is oriented to person, place, and time. He appears well-developed and well-nourished.  HENT:  Head: Normocephalic.  Right Ear: External ear normal.  Left Ear: External ear normal.  Nose: Nose normal.  Mouth/Throat: Oropharynx is clear and moist.  Eyes: Conjunctivae and EOM are normal. Pupils are equal, round, and reactive to light.  Neck: Normal range of motion. Neck supple.  Cardiovascular: Normal rate, regular rhythm, normal heart sounds and intact distal pulses.     Pulmonary/Chest: Effort normal and breath sounds normal.  Abdominal: Soft. Bowel sounds are normal.  Musculoskeletal: Normal range of motion.  Neurological: He is alert and oriented to person, place, and time.  Skin: Skin is warm and dry.  Psychiatric: He has a normal mood and affect. His behavior is normal. Judgment and thought content normal.    BP 141/78 mmHg  Pulse 60  Temp(Src) 97.1 F (36.2 C) (Oral)  Ht $R'5\' 7"'PC$  (1.702 m)  Wt 157 lb (71.215 kg)  BMI 24.58 kg/m2       Assessment & Plan:  1. Hyperlipidemia Will likely continue with atorvastatin unless labs indicate otherwise. Also take special for triglycerides. - Lipid panel - CMP14+EGFR  Wardell Honour MD

## 2014-10-08 LAB — LIPID PANEL
CHOLESTEROL TOTAL: 183 mg/dL (ref 100–199)
Chol/HDL Ratio: 4.8 ratio units (ref 0.0–5.0)
HDL: 38 mg/dL — ABNORMAL LOW (ref >39)
LDL CALC: 123 mg/dL — AB (ref 0–99)
Triglycerides: 109 mg/dL (ref 0–149)
VLDL Cholesterol Cal: 22 mg/dL (ref 5–40)

## 2014-10-08 LAB — CMP14+EGFR
ALT: 8 IU/L (ref 0–44)
AST: 17 IU/L (ref 0–40)
Albumin/Globulin Ratio: 1.9 (ref 1.1–2.5)
Albumin: 4.1 g/dL (ref 3.5–4.8)
Alkaline Phosphatase: 53 IU/L (ref 39–117)
BILIRUBIN TOTAL: 0.6 mg/dL (ref 0.0–1.2)
BUN/Creatinine Ratio: 13 (ref 10–22)
BUN: 10 mg/dL (ref 8–27)
CO2: 24 mmol/L (ref 18–29)
Calcium: 9 mg/dL (ref 8.6–10.2)
Chloride: 99 mmol/L (ref 97–108)
Creatinine, Ser: 0.77 mg/dL (ref 0.76–1.27)
GFR calc Af Amer: 101 mL/min/{1.73_m2} (ref >59)
GFR, EST NON AFRICAN AMERICAN: 88 mL/min/{1.73_m2} (ref >59)
GLUCOSE: 107 mg/dL — AB (ref 65–99)
Globulin, Total: 2.2 g/dL (ref 1.5–4.5)
Potassium: 4.8 mmol/L (ref 3.5–5.2)
Sodium: 140 mmol/L (ref 134–144)
TOTAL PROTEIN: 6.3 g/dL (ref 6.0–8.5)

## 2015-02-03 DIAGNOSIS — Z961 Presence of intraocular lens: Secondary | ICD-10-CM | POA: Diagnosis not present

## 2015-02-03 DIAGNOSIS — H5203 Hypermetropia, bilateral: Secondary | ICD-10-CM | POA: Diagnosis not present

## 2015-02-03 DIAGNOSIS — H35373 Puckering of macula, bilateral: Secondary | ICD-10-CM | POA: Diagnosis not present

## 2015-04-07 ENCOUNTER — Encounter (INDEPENDENT_AMBULATORY_CARE_PROVIDER_SITE_OTHER): Payer: Self-pay

## 2015-04-07 ENCOUNTER — Encounter: Payer: Self-pay | Admitting: Family Medicine

## 2015-04-07 ENCOUNTER — Ambulatory Visit (INDEPENDENT_AMBULATORY_CARE_PROVIDER_SITE_OTHER): Payer: Medicare Other | Admitting: Family Medicine

## 2015-04-07 VITALS — BP 153/70 | HR 66 | Temp 97.2°F | Ht 67.0 in | Wt 155.8 lb

## 2015-04-07 DIAGNOSIS — E785 Hyperlipidemia, unspecified: Secondary | ICD-10-CM | POA: Diagnosis not present

## 2015-04-07 DIAGNOSIS — Z23 Encounter for immunization: Secondary | ICD-10-CM

## 2015-04-07 MED ORDER — ATORVASTATIN CALCIUM 20 MG PO TABS: 20.0000 mg | ORAL_TABLET | Freq: Every day | ORAL | Status: DC

## 2015-04-07 NOTE — Progress Notes (Signed)
   Subjective:    Patient ID: Kyle [name], male    DOB: Dec 23, 1934, 80 y.o.   MRN: [id number]  HPI 80 year old gentleman here to follow-up hyperlipidemia. He takes Lipitor 20 mg every other day. That is well tolerated. He also takes fish oil.  He had mitral valve replacement and had been in atrial fibrillation prior to that but he is not on medication now for atrial fibrillation. He denies chest pain or palpitation.   Patient Active Problem List   Diagnosis Date Noted  . Hyperlipidemia 05/07/2009  . MITRAL REGURGITATION 05/07/2009  . Atrial fibrillation 05/07/2009   Outpatient Encounter Prescriptions as of 04/07/2015  Medication Sig  . aspirin 81 MG tablet Take 81 mg by mouth daily.    Marland Kitchen atorvastatin (LIPITOR) 20 MG tablet Take 1 tablet (20 mg total) by mouth daily.  . fish oil-omega-3 fatty acids 1000 MG capsule Take 1 capsule by mouth daily.    . [DISCONTINUED] Multiple Vitamin (MULTIVITAMIN) tablet Take 1 tablet by mouth daily.     No facility-administered encounter medications on file as of 04/07/2015.      Review of Systems  Constitutional: Negative.   HENT: Negative.   Eyes: Negative.   Respiratory: Negative.  Negative for shortness of breath.   Cardiovascular: Negative.  Negative for chest pain and leg swelling.  Gastrointestinal: Negative.   Genitourinary: Negative.   Musculoskeletal: Negative.   Skin: Negative.   Neurological: Negative.   Psychiatric/Behavioral: Negative.   All other systems reviewed and are negative.      Objective:   Physical Exam  Constitutional: He is oriented to person, place, and time. He appears well-developed and well-nourished.  HENT:  Head: Normocephalic.  Right Ear: External ear normal.  Left Ear: External ear normal.  Mouth/Throat: Oropharynx is clear and moist.  Cardiovascular: Normal rate and regular rhythm.   He appears to be in normal sinus rhythm today  Pulmonary/Chest: Effort normal and breath sounds normal.  Neurological:  He is alert and oriented to person, place, and time.     BP 153/70 mmHg  Pulse 66  Temp(Src) 97.2 F (36.2 C) (Oral)  Ht 5\' 7"  (1.702 m)  Wt 155 lb 12.8 oz (70.67 kg)  BMI 24.40 kg/m2      Assessment & Plan:  1. Hyperlipidemia We'll monitor lipids since he is on every other day regimen. Would continue with same or less we see some significant change  Wardell Honour MD

## 2015-04-07 NOTE — Patient Instructions (Signed)
You may receive a survey either by mail or email. Please take time to complete this as it helps [place] to serve you better.   Pneumococcal Vaccine, Polyvalent suspension for injection What is this medicine? PNEUMOCOCCAL VACCINE, POLYVALENT (NEU mo KOK al vak SEEN, pol ee VEY luhnt) is a vaccine to prevent pneumococcus bacteria infection. These bacteria are a major cause of ear infections, 'Strep throat' infections, and serious pneumonia, meningitis, or blood infections worldwide. These vaccines help the body to produce antibodies (protective substances) that help your body defend against these bacteria. This vaccine is recommended for infants and young children. This vaccine will not treat an infection. This medicine may be used for other purposes; ask your health care provider or pharmacist if you have questions. COMMON BRAND NAME(S): Prevnar 13 What should I tell my health care provider before I take this medicine? They need to know if you have any of these conditions: -bleeding problems -fever -immune system problems -low platelet count in the blood -seizures -an unusual or allergic reaction to pneumococcal vaccine, diphtheria toxoid, other vaccines, latex, other medicines, foods, dyes, or preservatives -pregnant or trying to get pregnant -breast-feeding How should I use this medicine? This vaccine is for injection into a muscle. It is given by a health care professional. A copy of Vaccine Information Statements will be given before each vaccination. Read this sheet carefully each time. The sheet may change frequently. Talk to your pediatrician regarding the use of this medicine in children. While this drug may be prescribed for children as young as 30 weeks old for selected conditions, precautions do apply. Overdosage: If you think you have taken too much of this medicine contact a poison control center or emergency room at once. NOTE: This medicine is only for you. Do not share this medicine  with others. What if I miss a dose? It is important not to miss your dose. Call your doctor or health care professional if you are unable to keep an appointment. What may interact with this medicine? -medicines for cancer chemotherapy -medicines that suppress your immune function -medicines that treat or prevent blood clots like warfarin, enoxaparin, and dalteparin -steroid medicines like prednisone or cortisone This list may not describe all possible interactions. Give your health care provider a list of all the medicines, herbs, non-prescription drugs, or dietary supplements you use. Also tell them if you smoke, drink alcohol, or use illegal drugs. Some items may interact with your medicine. What should I watch for while using this medicine? Mild fever and pain should go away in 3 days or less. Report any unusual symptoms to your doctor or health care professional. What side effects may I notice from receiving this medicine? Side effects that you should report to your doctor or health care professional as soon as possible: -allergic reactions like skin rash, itching or hives, swelling of the face, lips, or tongue -breathing problems -confused -fever over 102 degrees F -pain, tingling, numbness in the hands or feet -seizures -unusual bleeding or bruising -unusual muscle weakness Side effects that usually do not require medical attention (report to your doctor or health care professional if they continue or are bothersome): -aches and pains -diarrhea -fever of 102 degrees F or less -headache -irritable -loss of appetite -pain, tender at site where injected -trouble sleeping This list may not describe all possible side effects. Call your doctor for medical advice about side effects. You may report side effects to FDA at 1-800-FDA-1088. Where should I keep my medicine? This  does not apply. This vaccine is given in a clinic, pharmacy, doctor's office, or other health care setting and  will not be stored at home. NOTE: This sheet is a summary. It may not cover all possible information. If you have questions about this medicine, talk to your doctor, pharmacist, or health care provider.  2015, Elsevier/Gold Standard. (2008-11-10 10:17:22)

## 2015-04-08 LAB — LIPID PANEL
Chol/HDL Ratio: 4.6 ratio units (ref 0.0–5.0)
Cholesterol, Total: 196 mg/dL (ref 100–199)
HDL: 43 mg/dL (ref >39)
LDL Calculated: 132 mg/dL — ABNORMAL HIGH (ref 0–99)
TRIGLYCERIDES: 105 mg/dL (ref 0–149)
VLDL CHOLESTEROL CAL: 21 mg/dL (ref 5–40)

## 2015-06-21 ENCOUNTER — Ambulatory Visit (INDEPENDENT_AMBULATORY_CARE_PROVIDER_SITE_OTHER): Payer: Medicare Other

## 2015-06-21 DIAGNOSIS — Z23 Encounter for immunization: Secondary | ICD-10-CM

## 2015-10-20 ENCOUNTER — Encounter: Payer: Self-pay | Admitting: Family Medicine

## 2015-10-20 ENCOUNTER — Ambulatory Visit (INDEPENDENT_AMBULATORY_CARE_PROVIDER_SITE_OTHER): Payer: Medicare Other | Admitting: Family Medicine

## 2015-10-20 VITALS — BP 148/75 | HR 56 | Temp 97.2°F | Ht 67.0 in | Wt 161.0 lb

## 2015-10-20 DIAGNOSIS — E785 Hyperlipidemia, unspecified: Secondary | ICD-10-CM | POA: Diagnosis not present

## 2015-10-20 MED ORDER — ATORVASTATIN CALCIUM 20 MG PO TABS: 20.0000 mg | ORAL_TABLET | Freq: Every day | ORAL | Status: DC

## 2015-10-20 NOTE — Progress Notes (Signed)
   Subjective:    Patient ID: Kyle [name], male    DOB: 1934-09-15, 81 y.o.   MRN: [id number]  HPI 81 year old gentleman here to follow-up hyperlipidemia. He has a history of rheumatic disease and mitral aortic valve insufficiency. He had repair of mitral valve and apparently the atrial fibrillation resolved. He is not currently on any rate controlling drugs or anticoagulants as one might expect with A. fib. He tolerates the atorvastatin. He takes 20 mg every other day. Lipids when last checked in July 2016 were okay but not great. LDL was not at goal, that is less than 100.  Patient Active Problem List   Diagnosis Date Noted  . Hyperlipidemia 05/07/2009  . MITRAL REGURGITATION 05/07/2009  . Atrial fibrillation ([place]) 05/07/2009   Outpatient Encounter Prescriptions as of 10/20/2015  Medication Sig  . aspirin 81 MG tablet Take 81 mg by mouth daily.    Marland Kitchen atorvastatin (LIPITOR) 20 MG tablet Take 1 tablet (20 mg total) by mouth daily.  . fish oil-omega-3 fatty acids 1000 MG capsule Take 1 capsule by mouth daily.     No facility-administered encounter medications on file as of 10/20/2015.      Review of Systems  Constitutional: Negative.   HENT: Negative.   Eyes: Negative.   Respiratory: Negative.  Negative for shortness of breath.   Cardiovascular: Negative.  Negative for chest pain and leg swelling.  Gastrointestinal: Negative.   Genitourinary: Negative.   Musculoskeletal: Negative.   Skin: Negative.   Neurological: Negative.   Psychiatric/Behavioral: Negative.   All other systems reviewed and are negative.      Objective:   Physical Exam  Constitutional: He is oriented to person, place, and time. He appears well-developed and well-nourished.  HENT:  Head: Normocephalic.  Cardiovascular: Normal rate and regular rhythm.   Pulmonary/Chest: Effort normal and breath sounds normal.  Abdominal: Soft. Bowel sounds are normal.  Neurological: He is alert and oriented to person, place,  and time.  Psychiatric: He has a normal mood and affect.          Assessment & Plan:  1. Hyperlipidemia Continue with atorvastatin 20 mg every OD. Will notify with results tomorrow - CMP14+EGFR - Lipid panel   Wardell Honour MD

## 2015-10-21 LAB — CMP14+EGFR
A/G RATIO: 2.2 (ref 1.1–2.5)
ALT: 9 IU/L (ref 0–44)
AST: 16 IU/L (ref 0–40)
Albumin: 4.2 g/dL (ref 3.5–4.8)
Alkaline Phosphatase: 46 IU/L (ref 39–117)
BUN/Creatinine Ratio: 9 — ABNORMAL LOW (ref 10–22)
BUN: 7 mg/dL — ABNORMAL LOW (ref 8–27)
Bilirubin Total: 0.6 mg/dL (ref 0.0–1.2)
CALCIUM: 8.9 mg/dL (ref 8.6–10.2)
CO2: 27 mmol/L (ref 18–29)
CREATININE: 0.78 mg/dL (ref 0.76–1.27)
Chloride: 99 mmol/L (ref 96–106)
GFR, EST AFRICAN AMERICAN: 100 mL/min/{1.73_m2} (ref >59)
GFR, EST NON AFRICAN AMERICAN: 86 mL/min/{1.73_m2} (ref >59)
Globulin, Total: 1.9 g/dL (ref 1.5–4.5)
Glucose: 98 mg/dL (ref 65–99)
Potassium: 4.6 mmol/L (ref 3.5–5.2)
Sodium: 140 mmol/L (ref 134–144)
TOTAL PROTEIN: 6.1 g/dL (ref 6.0–8.5)

## 2015-10-21 LAB — LIPID PANEL
Chol/HDL Ratio: 3.9 ratio units (ref 0.0–5.0)
Cholesterol, Total: 171 mg/dL (ref 100–199)
HDL: 44 mg/dL (ref >39)
LDL CALC: 110 mg/dL — AB (ref 0–99)
TRIGLYCERIDES: 86 mg/dL (ref 0–149)
VLDL Cholesterol Cal: 17 mg/dL (ref 5–40)

## 2016-01-27 DIAGNOSIS — H2513 Age-related nuclear cataract, bilateral: Secondary | ICD-10-CM | POA: Diagnosis not present

## 2016-01-27 DIAGNOSIS — H40033 Anatomical narrow angle, bilateral: Secondary | ICD-10-CM | POA: Diagnosis not present

## 2016-04-19 ENCOUNTER — Ambulatory Visit: Payer: Medicare Other | Admitting: Family Medicine

## 2016-04-19 ENCOUNTER — Encounter: Payer: Self-pay | Admitting: Family Medicine

## 2016-04-19 ENCOUNTER — Ambulatory Visit (INDEPENDENT_AMBULATORY_CARE_PROVIDER_SITE_OTHER): Payer: Medicare Other | Admitting: Family Medicine

## 2016-04-19 VITALS — BP 132/73 | HR 62 | Temp 97.4°F | Ht 67.0 in | Wt 155.8 lb

## 2016-04-19 DIAGNOSIS — I480 Paroxysmal atrial fibrillation: Secondary | ICD-10-CM

## 2016-04-19 DIAGNOSIS — E785 Hyperlipidemia, unspecified: Secondary | ICD-10-CM

## 2016-04-19 NOTE — Progress Notes (Signed)
   Subjective:    Patient ID: Kyle [name], male    DOB: 12/20/1934, 81 y.o.   MRN: [id number]  HPI 81 year old male who is here to follow-up atrial fib hyperlipidemia. He is doing very well without complaints today. He has been active in his garden and canning. There is been no shortness of breath palpitations or chest pain. He has a history of history of atrial fibrillation but he is on no medicines to control rate or rhythm. He had mitral valve replacement in the past. His only medication is atorvastatin 20 mg every other day.  Patient Active Problem List   Diagnosis Date Noted  . Hyperlipidemia 05/07/2009  . MITRAL REGURGITATION 05/07/2009  . Atrial fibrillation ([place]) 05/07/2009   Outpatient Encounter Prescriptions as of 04/19/2016  Medication Sig  . aspirin 81 MG tablet Take 81 mg by mouth daily.    Marland Kitchen atorvastatin (LIPITOR) 20 MG tablet Take 1 tablet (20 mg total) by mouth daily.  . fish oil-omega-3 fatty acids 1000 MG capsule Take 1 capsule by mouth daily.     No facility-administered encounter medications on file as of 04/19/2016.       Review of Systems  Constitutional: Negative.   HENT: Negative.   Eyes: Negative.   Respiratory: Negative.  Negative for shortness of breath.   Cardiovascular: Negative.  Negative for chest pain and leg swelling.  Gastrointestinal: Negative.   Genitourinary: Negative.   Musculoskeletal: Negative.   Skin: Negative.   Neurological: Negative.   Psychiatric/Behavioral: Negative.   All other systems reviewed and are negative.      Objective:   Physical Exam  Constitutional: He is oriented to person, place, and time. He appears well-developed and well-nourished.  HENT:  Mouth/Throat: Oropharynx is clear and moist.  Cardiovascular: Normal rate, regular rhythm and normal heart sounds.   I believe he is in normal sinus rhythm  Pulmonary/Chest: Effort normal and breath sounds normal.  Musculoskeletal: He exhibits no edema.  Neurological: He is  alert and oriented to person, place, and time.  Psychiatric: He has a normal mood and affect. His behavior is normal.   BP (!) 141/76 (BP Location: Left Arm, Patient Position: Sitting, Cuff Size: Normal)   Pulse 62   Temp 97.4 F (36.3 C) (Oral)   Ht 5\' 7"  (1.702 m)   Wt 155 lb 12.8 oz (70.7 kg)   BMI 24.40 kg/m         Assessment & Plan:  1. Paroxysmal atrial fibrillation (HCC) He has not been in atrial fib for some time and that may be resolved. I would leave that to the cardiologist to make that call.  2. Hyperlipidemia No problems with tolerance of every other day statin. Will repeat lipid check in 6 months. LDL was not quite at goal at 110.  Wardell Honour MD

## 2016-06-12 ENCOUNTER — Ambulatory Visit (INDEPENDENT_AMBULATORY_CARE_PROVIDER_SITE_OTHER): Payer: Medicare Other

## 2016-06-12 DIAGNOSIS — Z23 Encounter for immunization: Secondary | ICD-10-CM | POA: Diagnosis not present

## 2016-09-14 ENCOUNTER — Ambulatory Visit (INDEPENDENT_AMBULATORY_CARE_PROVIDER_SITE_OTHER): Payer: Medicare Other | Admitting: Family Medicine

## 2016-09-14 ENCOUNTER — Encounter: Payer: Self-pay | Admitting: Family Medicine

## 2016-09-14 VITALS — BP 137/70 | HR 60 | Temp 98.5°F | Ht 67.0 in | Wt 159.0 lb

## 2016-09-14 DIAGNOSIS — J209 Acute bronchitis, unspecified: Secondary | ICD-10-CM

## 2016-09-14 MED ORDER — AZITHROMYCIN 250 MG PO TABS: ORAL_TABLET | ORAL | 0 refills | Status: DC

## 2016-09-14 NOTE — Progress Notes (Signed)
   Subjective:    Patient ID: Kyle [name], male    DOB: December 05, 1934, 82 y.o.   MRN: [id number]  HPI  Patient here today for cough and congestion that started about 4-5 days ago.Sputum is starting to get darker. He thinks congestion is more in his chest and in his head.    Patient Active Problem List   Diagnosis Date Noted  . Hyperlipidemia 05/07/2009  . MITRAL REGURGITATION 05/07/2009  . Atrial fibrillation ([place]) 05/07/2009   Outpatient Encounter Prescriptions as of 09/14/2016  Medication Sig  . aspirin 81 MG tablet Take 81 mg by mouth daily.    Marland Kitchen atorvastatin (LIPITOR) 20 MG tablet Take 1 tablet (20 mg total) by mouth daily.  . fish oil-omega-3 fatty acids 1000 MG capsule Take 1 capsule by mouth daily.     No facility-administered encounter medications on file as of 09/14/2016.      Review of Systems  Constitutional: Negative.   HENT: Positive for congestion.   Eyes: Negative.   Respiratory: Positive for cough.   Cardiovascular: Negative.   Gastrointestinal: Negative.   Endocrine: Negative.   Genitourinary: Negative.   Musculoskeletal: Negative.   Skin: Negative.   Allergic/Immunologic: Negative.   Neurological: Negative.   Hematological: Negative.   Psychiatric/Behavioral: Negative.        Objective:   Physical Exam  Constitutional: He is oriented to person, place, and time. He appears well-developed and well-nourished.  HENT:  Mouth/Throat: Oropharynx is clear and moist.  Cardiovascular: Normal rate and regular rhythm.   Pulmonary/Chest: Effort normal and breath sounds normal.  There are rhonchi on the right side of his chest consistent with bronchitis  Neurological: He is alert and oriented to person, place, and time.    BP 137/70 (BP Location: Right Arm)   Pulse 60   Temp 98.5 F (36.9 C) (Oral)   Ht 5\' 7"  (1.702 m)   Wt 159 lb (72.1 kg)   BMI 24.90 kg/m        Assessment & Plan:  1. Acute bronchitis, unspecified organism Will give him a Z-Pak. Also  suggested Mucinex and drinking plenty of fluids  Wardell Honour MD

## 2016-10-26 ENCOUNTER — Encounter: Payer: Self-pay | Admitting: Family Medicine

## 2016-10-26 ENCOUNTER — Ambulatory Visit (INDEPENDENT_AMBULATORY_CARE_PROVIDER_SITE_OTHER): Payer: Medicare Other | Admitting: Family Medicine

## 2016-10-26 VITALS — BP 143/71 | HR 53 | Temp 97.0°F | Ht 67.0 in | Wt 156.6 lb

## 2016-10-26 DIAGNOSIS — E785 Hyperlipidemia, unspecified: Secondary | ICD-10-CM

## 2016-10-26 DIAGNOSIS — Z125 Encounter for screening for malignant neoplasm of prostate: Secondary | ICD-10-CM | POA: Diagnosis not present

## 2016-10-26 NOTE — Progress Notes (Signed)
   Subjective:    Patient ID: Kyle [name], male    DOB: [birth year], 82 y.o.   MRN: [id number]  HPI 82 year old male who is followed for his lipids. He has a history of atrial fibrillation that was related to mitral valve disease. He had repair of that lateral valve in 2007 and has not had any fibrillation since then to his knowledge. He denies any chest pain. He had some leg pain with Lipitor 20 mg so he reduced it to 10 mg. Need to check lipids today to reassess  Patient Active Problem List   Diagnosis Date Noted  . Hyperlipidemia 05/07/2009  . MITRAL REGURGITATION 05/07/2009  . Atrial fibrillation ([place]) 05/07/2009   Outpatient Encounter Prescriptions as of 10/26/2016  Medication Sig  . aspirin 81 MG tablet Take 81 mg by mouth daily.    Marland Kitchen atorvastatin (LIPITOR) 10 MG tablet Take 10 mg by mouth daily.  . fish oil-omega-3 fatty acids 1000 MG capsule Take 1 capsule by mouth daily.    . [DISCONTINUED] atorvastatin (LIPITOR) 20 MG tablet Take 1 tablet (20 mg total) by mouth daily.  . [DISCONTINUED] azithromycin (ZITHROMAX) 250 MG tablet As directed   No facility-administered encounter medications on file as of 10/26/2016.       Review of Systems  Constitutional: Negative.   HENT: Negative.   Eyes: Negative.   Respiratory: Negative.  Negative for shortness of breath.   Cardiovascular: Negative.  Negative for chest pain and leg swelling.  Gastrointestinal: Negative.   Genitourinary: Negative.   Musculoskeletal: Negative.   Skin: Negative.   Neurological: Negative.   Psychiatric/Behavioral: Negative.   All other systems reviewed and are negative.      Objective:   Physical Exam  Constitutional: He is oriented to person, place, and time. He appears well-developed and well-nourished.  Cardiovascular: Normal rate, regular rhythm, normal heart sounds and intact distal pulses.   Pulmonary/Chest: Effort normal and breath sounds normal.  Musculoskeletal: Normal range of motion.    Neurological: He is alert and oriented to person, place, and time.   BP (!) 143/71   Pulse (!) 53   Temp 97 F (36.1 C) (Oral)   Ht '5\' 7"'$  (1.702 m)   Wt 156 lb 9.6 oz (71 kg)   BMI 24.53 kg/m         Assessment & Plan:  1. Hyperlipidemia, unspecified hyperlipidemia type Tolerates 10 mg of atorvastatin. LDL not quite at goal when assessed one year ago. - CBC with Differential/Platelet - CMP14+EGFR - Lipid panel - PSA, total and free   Wardell Honour MD

## 2016-10-27 LAB — CBC WITH DIFFERENTIAL/PLATELET
BASOS ABS: 0 10*3/uL (ref 0.0–0.2)
Basos: 0 %
EOS (ABSOLUTE): 0.1 10*3/uL (ref 0.0–0.4)
Eos: 1 %
Hematocrit: 40.3 % (ref 37.5–51.0)
Hemoglobin: 13.6 g/dL (ref 13.0–17.7)
Immature Grans (Abs): 0 10*3/uL (ref 0.0–0.1)
Immature Granulocytes: 0 %
LYMPHS ABS: 2.6 10*3/uL (ref 0.7–3.1)
Lymphs: 36 %
MCH: 29.8 pg (ref 26.6–33.0)
MCHC: 33.7 g/dL (ref 31.5–35.7)
MCV: 88 fL (ref 79–97)
MONOS ABS: 0.4 10*3/uL (ref 0.1–0.9)
Monocytes: 6 %
NEUTROS ABS: 4.2 10*3/uL (ref 1.4–7.0)
Neutrophils: 57 %
Platelets: 241 10*3/uL (ref 150–379)
RBC: 4.57 x10E6/uL (ref 4.14–5.80)
RDW: 13.1 % (ref 12.3–15.4)
WBC: 7.3 10*3/uL (ref 3.4–10.8)

## 2016-10-27 LAB — CMP14+EGFR
ALK PHOS: 51 IU/L (ref 39–117)
ALT: 12 IU/L (ref 0–44)
AST: 19 IU/L (ref 0–40)
Albumin/Globulin Ratio: 1.9 (ref 1.2–2.2)
Albumin: 4.3 g/dL (ref 3.5–4.8)
BILIRUBIN TOTAL: 0.6 mg/dL (ref 0.0–1.2)
BUN / CREAT RATIO: 10 (ref 10–24)
BUN: 8 mg/dL (ref 8–27)
CHLORIDE: 99 mmol/L (ref 96–106)
CO2: 20 mmol/L (ref 18–29)
CREATININE: 0.77 mg/dL (ref 0.76–1.27)
Calcium: 9.2 mg/dL (ref 8.6–10.2)
GFR calc Af Amer: 100 mL/min/{1.73_m2} (ref >59)
GFR calc non Af Amer: 86 mL/min/{1.73_m2} (ref >59)
GLUCOSE: 102 mg/dL — AB (ref 65–99)
Globulin, Total: 2.3 g/dL (ref 1.5–4.5)
Potassium: 4.8 mmol/L (ref 3.5–5.2)
SODIUM: 140 mmol/L (ref 134–144)
Total Protein: 6.6 g/dL (ref 6.0–8.5)

## 2016-10-27 LAB — PSA, TOTAL AND FREE
PROSTATE SPECIFIC AG, SERUM: 0.4 ng/mL (ref 0.0–4.0)
PSA FREE: 0.13 ng/mL
PSA, Free Pct: 32.5 %

## 2016-10-27 LAB — LIPID PANEL
CHOLESTEROL TOTAL: 201 mg/dL — AB (ref 100–199)
Chol/HDL Ratio: 4.2 ratio units (ref 0.0–5.0)
HDL: 48 mg/dL (ref >39)
LDL CALC: 134 mg/dL — AB (ref 0–99)
Triglycerides: 95 mg/dL (ref 0–149)
VLDL Cholesterol Cal: 19 mg/dL (ref 5–40)

## 2016-10-30 ENCOUNTER — Other Ambulatory Visit: Payer: Self-pay | Admitting: *Deleted

## 2016-10-30 MED ORDER — ATORVASTATIN CALCIUM 20 MG PO TABS: 20.0000 mg | ORAL_TABLET | Freq: Every day | ORAL | 1 refills | Status: DC

## 2016-12-25 DIAGNOSIS — H40033 Anatomical narrow angle, bilateral: Secondary | ICD-10-CM | POA: Diagnosis not present

## 2016-12-25 DIAGNOSIS — H2513 Age-related nuclear cataract, bilateral: Secondary | ICD-10-CM | POA: Diagnosis not present

## 2017-02-15 ENCOUNTER — Ambulatory Visit: Payer: Medicare Other

## 2017-02-21 ENCOUNTER — Encounter: Payer: Self-pay | Admitting: *Deleted

## 2017-04-25 ENCOUNTER — Ambulatory Visit: Payer: Medicare Other | Admitting: Family Medicine

## 2017-04-26 ENCOUNTER — Encounter: Payer: Self-pay | Admitting: Family Medicine

## 2017-04-26 ENCOUNTER — Ambulatory Visit (INDEPENDENT_AMBULATORY_CARE_PROVIDER_SITE_OTHER): Payer: Medicare Other | Admitting: Family Medicine

## 2017-04-26 VITALS — BP 138/78 | HR 59 | Temp 97.7°F | Ht 67.0 in | Wt 153.0 lb

## 2017-04-26 DIAGNOSIS — E785 Hyperlipidemia, unspecified: Secondary | ICD-10-CM | POA: Diagnosis not present

## 2017-04-26 MED ORDER — ATORVASTATIN CALCIUM 20 MG PO TABS: 20.0000 mg | ORAL_TABLET | Freq: Every day | ORAL | 2 refills | Status: DC

## 2017-04-26 NOTE — Progress Notes (Signed)
BP 138/78   Pulse (!) 59   Temp 97.7 F (36.5 C) (Oral)   Ht 5' 7"  (1.702 m)   Wt 153 lb (69.4 kg)   BMI 23.96 kg/m    Subjective:    Patient ID: Kyle [name], male    DOB: July 18, 1935, 82 y.o.   MRN: [id number]  HPI: Kyle [name] is a 82 y.o. male presenting on 04/26/2017 for Hyperlipidemia (6 mo; patient is fasting)   HPI Hyperlipidemia Patient is coming in for recheck of his hyperlipidemia. The patient is currently taking Lipitor 20 mg every other day. They deny any issues with myalgias or history of liver damage from it. They deny any focal numbness or weakness or chest pain.   Relevant past medical, surgical, family and social history reviewed and updated as indicated. Interim medical history since our last visit reviewed. Allergies and medications reviewed and updated.  Review of Systems  Constitutional: Negative for chills and fever.  Respiratory: Negative for shortness of breath and wheezing.   Cardiovascular: Negative for chest pain and leg swelling.  Musculoskeletal: Negative for back pain and gait problem.  Skin: Negative for rash.  Neurological: Negative for dizziness, weakness, light-headedness, numbness and headaches.  All other systems reviewed and are negative.   Per HPI unless specifically indicated above        Objective:    BP 138/78   Pulse (!) 59   Temp 97.7 F (36.5 C) (Oral)   Ht 5' 7"  (1.702 m)   Wt 153 lb (69.4 kg)   BMI 23.96 kg/m   Wt Readings from Last 3 Encounters:  04/26/17 153 lb (69.4 kg)  10/26/16 156 lb 9.6 oz (71 kg)  09/14/16 159 lb (72.1 kg)    Physical Exam  Constitutional: He is oriented to person, place, and time. He appears well-developed and well-nourished. No distress.  Eyes: Conjunctivae are normal. No scleral icterus.  Neck: Neck supple. No thyromegaly present.  Cardiovascular: Normal rate, regular rhythm, normal heart sounds and intact distal pulses.   No murmur heard. Pulmonary/Chest: Effort normal and breath  sounds normal. No respiratory distress. He has no wheezes. He has no rales.  Musculoskeletal: Normal range of motion. He exhibits no edema.  Lymphadenopathy:    He has no cervical adenopathy.  Neurological: He is alert and oriented to person, place, and time. Coordination normal.  Skin: Skin is warm and dry. No rash noted. He is not diaphoretic.  Psychiatric: He has a normal mood and affect. His behavior is normal.  Nursing note and vitals reviewed.   Results for orders placed or performed in visit on 10/26/16  CBC with Differential/Platelet  Result Value Ref Range   WBC 7.3 3.4 - 10.8 x10E3/uL   RBC 4.57 4.14 - 5.80 x10E6/uL   Hemoglobin 13.6 13.0 - 17.7 g/dL   Hematocrit 40.3 37.5 - 51.0 %   MCV 88 79 - 97 fL   MCH 29.8 26.6 - 33.0 pg   MCHC 33.7 31.5 - 35.7 g/dL   RDW 13.1 12.3 - 15.4 %   Platelets 241 150 - 379 x10E3/uL   Neutrophils 57 Not Estab. %   Lymphs 36 Not Estab. %   Monocytes 6 Not Estab. %   Eos 1 Not Estab. %   Basos 0 Not Estab. %   Neutrophils Absolute 4.2 1.4 - 7.0 x10E3/uL   Lymphocytes Absolute 2.6 0.7 - 3.1 x10E3/uL   Monocytes Absolute 0.4 0.1 - 0.9 x10E3/uL   EOS (ABSOLUTE) 0.1 0.0 -  0.4 x10E3/uL   Basophils Absolute 0.0 0.0 - 0.2 x10E3/uL   Immature Granulocytes 0 Not Estab. %   Immature Grans (Abs) 0.0 0.0 - 0.1 x10E3/uL  CMP14+EGFR  Result Value Ref Range   Glucose 102 (H) 65 - 99 mg/dL   BUN 8 8 - 27 mg/dL   Creatinine, Ser 0.77 0.76 - 1.27 mg/dL   GFR calc non Af Amer 86 >59 mL/min/1.73   GFR calc Af Amer 100 >59 mL/min/1.73   BUN/Creatinine Ratio 10 10 - 24   Sodium 140 134 - 144 mmol/L   Potassium 4.8 3.5 - 5.2 mmol/L   Chloride 99 96 - 106 mmol/L   CO2 20 18 - 29 mmol/L   Calcium 9.2 8.6 - 10.2 mg/dL   Total Protein 6.6 6.0 - 8.5 g/dL   Albumin 4.3 3.5 - 4.8 g/dL   Globulin, Total 2.3 1.5 - 4.5 g/dL   Albumin/Globulin Ratio 1.9 1.2 - 2.2   Bilirubin Total 0.6 0.0 - 1.2 mg/dL   Alkaline Phosphatase 51 39 - 117 IU/L   AST 19 0 - 40  IU/L   ALT 12 0 - 44 IU/L  Lipid panel  Result Value Ref Range   Cholesterol, Total 201 (H) 100 - 199 mg/dL   Triglycerides 95 0 - 149 mg/dL   HDL 48 >39 mg/dL   VLDL Cholesterol Cal 19 5 - 40 mg/dL   LDL Calculated 134 (H) 0 - 99 mg/dL   Chol/HDL Ratio 4.2 0.0 - 5.0 ratio units  PSA, total and free  Result Value Ref Range   Prostate Specific Ag, Serum 0.4 0.0 - 4.0 ng/mL   PSA, Free 0.13 N/A ng/mL   PSA, Free Pct 32.5 %      Assessment & Plan:   Problem List Items Addressed This Visit      Other   Hyperlipidemia - Primary   Relevant Medications   atorvastatin (LIPITOR) 20 MG tablet   Other Relevant Orders   Lipid panel       Follow up plan: Return in about 6 months (around 10/27/2017), or if symptoms worsen or fail to improve, for Cholesterol recheck and full fasting labs.  Counseling provided for all of the vaccine components Orders Placed This Encounter  Procedures  . Lipid panel    Caryl Pina, MD [facility] 04/26/2017, 11:23 AM

## 2017-04-27 LAB — LIPID PANEL
CHOLESTEROL TOTAL: 158 mg/dL (ref 100–199)
Chol/HDL Ratio: 4.2 ratio (ref 0.0–5.0)
HDL: 38 mg/dL — ABNORMAL LOW (ref >39)
LDL Calculated: 103 mg/dL — ABNORMAL HIGH (ref 0–99)
Triglycerides: 85 mg/dL (ref 0–149)
VLDL CHOLESTEROL CAL: 17 mg/dL (ref 5–40)

## 2017-06-18 ENCOUNTER — Ambulatory Visit (INDEPENDENT_AMBULATORY_CARE_PROVIDER_SITE_OTHER): Payer: Medicare Other

## 2017-06-18 DIAGNOSIS — Z23 Encounter for immunization: Secondary | ICD-10-CM

## 2017-10-29 ENCOUNTER — Encounter: Payer: Self-pay | Admitting: Family Medicine

## 2017-10-29 ENCOUNTER — Ambulatory Visit (INDEPENDENT_AMBULATORY_CARE_PROVIDER_SITE_OTHER): Payer: Medicare Other | Admitting: Family Medicine

## 2017-10-29 VITALS — BP 163/75 | HR 53 | Temp 96.8°F | Ht 67.0 in | Wt 157.2 lb

## 2017-10-29 DIAGNOSIS — I480 Paroxysmal atrial fibrillation: Secondary | ICD-10-CM | POA: Diagnosis not present

## 2017-10-29 DIAGNOSIS — E785 Hyperlipidemia, unspecified: Secondary | ICD-10-CM | POA: Diagnosis not present

## 2017-10-29 NOTE — Progress Notes (Signed)
BP (!) 163/75   Pulse (!) 53   Temp (!) 96.8 F (36 C) (Oral)   Ht 5' 7"  (1.702 m)   Wt 157 lb 3.2 oz (71.3 kg)   BMI 24.62 kg/m    Subjective:    Patient ID: Kyle [name], male    DOB: 02-Jan-1935, 83 y.o.   MRN: [id number]  HPI: Kyle [name] is a 83 y.o. male presenting on 10/29/2017 for Hyperlipidemia (6 month follow up)   HPI Hyperlipidemia Patient is coming in for recheck of his hyperlipidemia. The patient is currently taking Lipitor and fish oil. They deny any issues with myalgias or history of liver damage from it. They deny any focal numbness or weakness or chest pain.  Patient also has a history of A. fib but has not had any issues with it over the past couple years  Relevant past medical, surgical, family and social history reviewed and updated as indicated. Interim medical history since our last visit reviewed. Allergies and medications reviewed and updated.  Review of Systems  Constitutional: Negative for chills and fever.  HENT: Negative for ear pain and tinnitus.   Respiratory: Negative for cough, shortness of breath and wheezing.   Cardiovascular: Negative for chest pain, palpitations and leg swelling.  Gastrointestinal: Negative for abdominal pain, blood in stool, constipation and diarrhea.  Musculoskeletal: Negative for back pain and myalgias.  Skin: Negative for rash.  Neurological: Negative for dizziness, weakness and headaches.  Psychiatric/Behavioral: Negative for suicidal ideas.    Per HPI unless specifically indicated above   Allergies as of 10/29/2017      Reactions   Penicillins    REACTION: dizziness/syncope      Medication List        Accurate as of 10/29/17 11:29 AM. Always use your most recent med list.          aspirin 81 MG tablet Take 81 mg by mouth daily.   atorvastatin 20 MG tablet Commonly known as:  LIPITOR Take 1 tablet (20 mg total) by mouth daily. As directed   fish oil-omega-3 fatty acids 1000 MG capsule Take 1  capsule by mouth daily.          Objective:    BP (!) 163/75   Pulse (!) 53   Temp (!) 96.8 F (36 C) (Oral)   Ht 5' 7"  (1.702 m)   Wt 157 lb 3.2 oz (71.3 kg)   BMI 24.62 kg/m   Wt Readings from Last 3 Encounters:  10/29/17 157 lb 3.2 oz (71.3 kg)  04/26/17 153 lb (69.4 kg)  10/26/16 156 lb 9.6 oz (71 kg)    Physical Exam  Constitutional: He is oriented to person, place, and time. He appears well-developed and well-nourished. No distress.  Eyes: Conjunctivae are normal. No scleral icterus.  Neck: Neck supple. No thyromegaly present.  Cardiovascular: Normal rate, regular rhythm, normal heart sounds and intact distal pulses.  No murmur heard. Pulmonary/Chest: Effort normal and breath sounds normal. No respiratory distress. He has no wheezes.  Musculoskeletal: Normal range of motion. He exhibits no edema.  Lymphadenopathy:    He has no cervical adenopathy.  Neurological: He is alert and oriented to person, place, and time. Coordination normal.  Skin: Skin is warm and dry. No rash noted. He is not diaphoretic.  Psychiatric: He has a normal mood and affect. His behavior is normal.  Nursing note and vitals reviewed.       Assessment & Plan:   Problem List Items  Addressed This Visit      Cardiovascular and Mediastinum   Atrial fibrillation ([place])   Relevant Orders   CBC with Differential/Platelet   CMP14+EGFR     Other   Hyperlipidemia - Primary   Relevant Orders   CMP14+EGFR   Lipid panel      Not currently in A. fib and was not recommended for anticoagulation per cardiology, takes an aspirin.  Follow up plan: Return in about 1 year (around 10/29/2018), or if symptoms worsen or fail to improve.  Counseling provided for all of the vaccine components Orders Placed This Encounter  Procedures  . CBC with Differential/Platelet  . CMP14+EGFR  . Lipid panel    Caryl Pina, MD [facility] 10/29/2017, 11:29 AM

## 2017-10-30 LAB — CMP14+EGFR
ALK PHOS: 47 IU/L (ref 39–117)
ALT: 10 IU/L (ref 0–44)
AST: 14 IU/L (ref 0–40)
Albumin/Globulin Ratio: 2.2 (ref 1.2–2.2)
Albumin: 4.4 g/dL (ref 3.5–4.7)
BILIRUBIN TOTAL: 0.5 mg/dL (ref 0.0–1.2)
BUN/Creatinine Ratio: 13 (ref 10–24)
BUN: 10 mg/dL (ref 8–27)
CHLORIDE: 101 mmol/L (ref 96–106)
CO2: 22 mmol/L (ref 20–29)
Calcium: 9.4 mg/dL (ref 8.6–10.2)
Creatinine, Ser: 0.77 mg/dL (ref 0.76–1.27)
GFR calc Af Amer: 99 mL/min/{1.73_m2} (ref >59)
GFR calc non Af Amer: 86 mL/min/{1.73_m2} (ref >59)
GLUCOSE: 98 mg/dL (ref 65–99)
Globulin, Total: 2 g/dL (ref 1.5–4.5)
Potassium: 4.6 mmol/L (ref 3.5–5.2)
Sodium: 138 mmol/L (ref 134–144)
TOTAL PROTEIN: 6.4 g/dL (ref 6.0–8.5)

## 2017-10-30 LAB — CBC WITH DIFFERENTIAL/PLATELET
BASOS ABS: 0 10*3/uL (ref 0.0–0.2)
Basos: 0 %
EOS (ABSOLUTE): 0.1 10*3/uL (ref 0.0–0.4)
Eos: 1 %
Hematocrit: 40.6 % (ref 37.5–51.0)
Hemoglobin: 13.6 g/dL (ref 13.0–17.7)
IMMATURE GRANS (ABS): 0 10*3/uL (ref 0.0–0.1)
Immature Granulocytes: 0 %
LYMPHS: 36 %
Lymphocytes Absolute: 3 10*3/uL (ref 0.7–3.1)
MCH: 29.8 pg (ref 26.6–33.0)
MCHC: 33.5 g/dL (ref 31.5–35.7)
MCV: 89 fL (ref 79–97)
Monocytes Absolute: 0.6 10*3/uL (ref 0.1–0.9)
Monocytes: 7 %
NEUTROS ABS: 4.7 10*3/uL (ref 1.4–7.0)
Neutrophils: 56 %
Platelets: 245 10*3/uL (ref 150–379)
RBC: 4.57 x10E6/uL (ref 4.14–5.80)
RDW: 13.3 % (ref 12.3–15.4)
WBC: 8.3 10*3/uL (ref 3.4–10.8)

## 2017-10-30 LAB — LIPID PANEL
CHOL/HDL RATIO: 3.8 ratio (ref 0.0–5.0)
Cholesterol, Total: 192 mg/dL (ref 100–199)
HDL: 51 mg/dL (ref >39)
LDL CALC: 123 mg/dL — AB (ref 0–99)
TRIGLYCERIDES: 91 mg/dL (ref 0–149)
VLDL Cholesterol Cal: 18 mg/dL (ref 5–40)

## 2017-11-13 ENCOUNTER — Other Ambulatory Visit: Payer: Self-pay | Admitting: Family Medicine

## 2017-12-26 DIAGNOSIS — L819 Disorder of pigmentation, unspecified: Secondary | ICD-10-CM | POA: Diagnosis not present

## 2017-12-26 DIAGNOSIS — C44311 Basal cell carcinoma of skin of nose: Secondary | ICD-10-CM | POA: Diagnosis not present

## 2017-12-26 DIAGNOSIS — L814 Other melanin hyperpigmentation: Secondary | ICD-10-CM | POA: Diagnosis not present

## 2017-12-26 DIAGNOSIS — D485 Neoplasm of uncertain behavior of skin: Secondary | ICD-10-CM | POA: Diagnosis not present

## 2017-12-26 DIAGNOSIS — Z85828 Personal history of other malignant neoplasm of skin: Secondary | ICD-10-CM | POA: Diagnosis not present

## 2017-12-26 DIAGNOSIS — L570 Actinic keratosis: Secondary | ICD-10-CM | POA: Diagnosis not present

## 2018-01-17 DIAGNOSIS — C44311 Basal cell carcinoma of skin of nose: Secondary | ICD-10-CM | POA: Diagnosis not present

## 2018-02-19 ENCOUNTER — Other Ambulatory Visit: Payer: Self-pay | Admitting: *Deleted

## 2018-02-19 MED ORDER — ATORVASTATIN CALCIUM 20 MG PO TABS: 20.0000 mg | ORAL_TABLET | Freq: Every day | ORAL | 0 refills | Status: DC

## 2018-04-24 ENCOUNTER — Telehealth: Payer: Self-pay | Admitting: Family Medicine

## 2018-05-20 DIAGNOSIS — L570 Actinic keratosis: Secondary | ICD-10-CM | POA: Diagnosis not present

## 2018-05-20 DIAGNOSIS — D485 Neoplasm of uncertain behavior of skin: Secondary | ICD-10-CM | POA: Diagnosis not present

## 2018-06-13 ENCOUNTER — Other Ambulatory Visit: Payer: Self-pay | Admitting: Family Medicine

## 2018-06-24 ENCOUNTER — Ambulatory Visit (INDEPENDENT_AMBULATORY_CARE_PROVIDER_SITE_OTHER): Payer: Medicare Other | Admitting: *Deleted

## 2018-06-24 DIAGNOSIS — Z23 Encounter for immunization: Secondary | ICD-10-CM | POA: Diagnosis not present

## 2018-07-19 ENCOUNTER — Other Ambulatory Visit: Payer: Self-pay | Admitting: Family Medicine

## 2018-07-19 NOTE — Telephone Encounter (Signed)
Det[place]er. NTBS. 30 days given 06/17/18

## 2018-07-19 NOTE — Telephone Encounter (Signed)
Attempted to contact patient - NVM 

## 2018-07-25 ENCOUNTER — Other Ambulatory Visit: Payer: Self-pay | Admitting: *Deleted

## 2018-07-25 MED ORDER — ATORVASTATIN CALCIUM 20 MG PO TABS: 20.0000 mg | ORAL_TABLET | Freq: Every day | ORAL | 0 refills | Status: DC

## 2018-07-25 NOTE — Telephone Encounter (Signed)
Patient was told to follow up yearly for annual check up.

## 2018-07-30 ENCOUNTER — Other Ambulatory Visit: Payer: Self-pay | Admitting: Family Medicine

## 2018-10-21 ENCOUNTER — Other Ambulatory Visit: Payer: Self-pay | Admitting: Family Medicine

## 2018-10-22 NOTE — Telephone Encounter (Signed)
OV 10/30/18 

## 2018-10-30 ENCOUNTER — Encounter: Payer: Self-pay | Admitting: Family Medicine

## 2018-10-30 ENCOUNTER — Ambulatory Visit (INDEPENDENT_AMBULATORY_CARE_PROVIDER_SITE_OTHER): Payer: Medicare Other | Admitting: Family Medicine

## 2018-10-30 VITALS — BP 144/64 | HR 48 | Temp 97.2°F | Ht 67.0 in | Wt 155.8 lb

## 2018-10-30 DIAGNOSIS — I480 Paroxysmal atrial fibrillation: Secondary | ICD-10-CM

## 2018-10-30 DIAGNOSIS — E782 Mixed hyperlipidemia: Secondary | ICD-10-CM

## 2018-10-30 MED ORDER — ATORVASTATIN CALCIUM 20 MG PO TABS: 20.0000 mg | ORAL_TABLET | Freq: Every day | ORAL | 3 refills | Status: DC

## 2018-10-30 NOTE — Progress Notes (Signed)
BP (!) 144/64   Pulse (!) 48   Temp (!) 97.2 F (36.2 C) (Oral)   Ht 5' 7"  (1.702 m)   Wt 155 lb 12.8 oz (70.7 kg)   BMI 24.40 kg/m    Subjective:    Patient ID: Kyle [name], male    DOB: [birth year], 84 y.o.   MRN: [id number]  HPI: CARLOSDANIEL [name] is a 84 y.o. male presenting on 10/30/2018 for Hyperlipidemia (check up of chronic medical conditions)   HPI Hyperlipidemia Patient is coming in for recheck of his hyperlipidemia. The patient is currently taking atorvastatin. They deny any issues with myalgias or history of liver damage from it. They deny any focal numbness or weakness or chest pain.   A. Fib Patient is coming in for A. fib recheck.  He says that he has not had any issues with that and denies any palpitations or flutters.  He is currently taken aspirin but is not on any kind of blood thinner and has not needed any.  We will continue to monitor him.  His heart rate today is 48.  Relevant past medical, surgical, family and social history reviewed and updated as indicated. Interim medical history since our last visit reviewed. Allergies and medications reviewed and updated.  Review of Systems  Constitutional: Negative for chills and fever.  Eyes: Negative for visual disturbance.  Respiratory: Negative for shortness of breath and wheezing.   Cardiovascular: Negative for chest pain and leg swelling.  Musculoskeletal: Negative for back pain and gait problem.  Skin: Negative for rash.  Neurological: Negative for dizziness, weakness, light-headedness and numbness.  All other systems reviewed and are negative.   Per HPI unless specifically indicated above   Allergies as of 10/30/2018      Reactions   Penicillins    REACTION: dizziness/syncope      Medication List       Accurate as of October 30, 2018 11:19 AM. Always use your most recent med list.        aspirin 81 MG tablet Take 81 mg by mouth daily.   atorvastatin 20 MG tablet Commonly known as:   LIPITOR Take 1 tablet (20 mg total) by mouth daily.   fish oil-omega-3 fatty acids 1000 MG capsule Take 1 capsule by mouth daily.          Objective:    BP (!) 144/64   Pulse (!) 48   Temp (!) 97.2 F (36.2 C) (Oral)   Ht 5' 7"  (1.702 m)   Wt 155 lb 12.8 oz (70.7 kg)   BMI 24.40 kg/m   Wt Readings from Last 3 Encounters:  10/30/18 155 lb 12.8 oz (70.7 kg)  10/29/17 157 lb 3.2 oz (71.3 kg)  04/26/17 153 lb (69.4 kg)    Physical Exam Vitals signs reviewed.  Constitutional:      General: He is not in acute distress.    Appearance: He is well-developed. He is not diaphoretic.  HENT:     Right Ear: External ear normal.     Left Ear: External ear normal.     Nose: Nose normal.     Mouth/Throat:     Pharynx: No oropharyngeal exudate.  Eyes:     General: No scleral icterus.    Extraocular Movements: Extraocular movements intact.     Conjunctiva/sclera: Conjunctivae normal.     Pupils: Pupils are equal, round, and reactive to light.  Neck:     Musculoskeletal: Neck supple.     Thyroid:  No thyromegaly.  Cardiovascular:     Rate and Rhythm: Normal rate and regular rhythm.     Heart sounds: Normal heart sounds. No murmur.  Pulmonary:     Effort: Pulmonary effort is normal. No respiratory distress.     Breath sounds: Normal breath sounds. No wheezing.  Abdominal:     General: Bowel sounds are normal. There is no distension.     Palpations: Abdomen is soft.     Tenderness: There is no abdominal tenderness. There is no guarding or rebound.  Musculoskeletal: Normal range of motion.  Lymphadenopathy:     Cervical: No cervical adenopathy.  Skin:    General: Skin is warm and dry.     Findings: No rash.  Neurological:     Mental Status: He is alert and oriented to person, place, and time.     Coordination: Coordination normal.  Psychiatric:        Behavior: Behavior normal.         Assessment & Plan:   Problem List Items Addressed This Visit      Cardiovascular  and Mediastinum   Atrial fibrillation ([facility])   Relevant Medications   atorvastatin (LIPITOR) 20 MG tablet   Other Relevant Orders   CBC with Differential/Platelet     Other   Hyperlipidemia - Primary   Relevant Medications   atorvastatin (LIPITOR) 20 MG tablet   Other Relevant Orders   CMP14+EGFR   Lipid panel       Follow up plan: Return in about 1 year (around 10/31/2019), or if symptoms worsen or fail to improve, for Upper lipidemia and A. fib recheck.  Counseling provided for all of the vaccine components Orders Placed This Encounter  Procedures  . CBC with Differential/Platelet  . CMP14+EGFR  . Lipid panel    Caryl Pina, MD [facility] 10/30/2018, 11:19 AM

## 2018-10-31 LAB — CMP14+EGFR
ALT: 11 IU/L (ref 0–44)
AST: 17 IU/L (ref 0–40)
Albumin/Globulin Ratio: 2.1 (ref 1.2–2.2)
Albumin: 4.4 g/dL (ref 3.6–4.6)
Alkaline Phosphatase: 56 IU/L (ref 39–117)
BUN/Creatinine Ratio: 11 (ref 10–24)
BUN: 9 mg/dL (ref 8–27)
Bilirubin Total: 0.5 mg/dL (ref 0.0–1.2)
CO2: 24 mmol/L (ref 20–29)
Calcium: 9.4 mg/dL (ref 8.6–10.2)
Chloride: 99 mmol/L (ref 96–106)
Creatinine, Ser: 0.82 mg/dL (ref 0.76–1.27)
GFR calc Af Amer: 96 mL/min/{1.73_m2} (ref >59)
GFR calc non Af Amer: 83 mL/min/{1.73_m2} (ref >59)
Globulin, Total: 2.1 g/dL (ref 1.5–4.5)
Glucose: 95 mg/dL (ref 65–99)
POTASSIUM: 4.6 mmol/L (ref 3.5–5.2)
Sodium: 139 mmol/L (ref 134–144)
Total Protein: 6.5 g/dL (ref 6.0–8.5)

## 2018-10-31 LAB — CBC WITH DIFFERENTIAL/PLATELET
Basophils Absolute: 0.1 10*3/uL (ref 0.0–0.2)
Basos: 1 %
EOS (ABSOLUTE): 0.1 10*3/uL (ref 0.0–0.4)
EOS: 2 %
HEMATOCRIT: 39.2 % (ref 37.5–51.0)
HEMOGLOBIN: 13.6 g/dL (ref 13.0–17.7)
IMMATURE GRANS (ABS): 0 10*3/uL (ref 0.0–0.1)
Immature Granulocytes: 0 %
LYMPHS ABS: 3.3 10*3/uL — AB (ref 0.7–3.1)
LYMPHS: 40 %
MCH: 30.2 pg (ref 26.6–33.0)
MCHC: 34.7 g/dL (ref 31.5–35.7)
MCV: 87 fL (ref 79–97)
MONOCYTES: 7 %
Monocytes Absolute: 0.6 10*3/uL (ref 0.1–0.9)
NEUTROS ABS: 4.2 10*3/uL (ref 1.4–7.0)
NEUTROS PCT: 50 %
Platelets: 218 10*3/uL (ref 150–450)
RBC: 4.5 x10E6/uL (ref 4.14–5.80)
RDW: 12.5 % (ref 11.6–15.4)
WBC: 8.4 10*3/uL (ref 3.4–10.8)

## 2018-10-31 LAB — LIPID PANEL
CHOL/HDL RATIO: 4.1 ratio (ref 0.0–5.0)
Cholesterol, Total: 201 mg/dL — ABNORMAL HIGH (ref 100–199)
HDL: 49 mg/dL (ref >39)
LDL Calculated: 133 mg/dL — ABNORMAL HIGH (ref 0–99)
Triglycerides: 96 mg/dL (ref 0–149)
VLDL Cholesterol Cal: 19 mg/dL (ref 5–40)

## 2018-11-18 DIAGNOSIS — L570 Actinic keratosis: Secondary | ICD-10-CM | POA: Diagnosis not present

## 2019-05-21 DIAGNOSIS — L570 Actinic keratosis: Secondary | ICD-10-CM | POA: Diagnosis not present

## 2019-07-09 ENCOUNTER — Other Ambulatory Visit: Payer: Self-pay

## 2019-07-09 ENCOUNTER — Ambulatory Visit (INDEPENDENT_AMBULATORY_CARE_PROVIDER_SITE_OTHER): Payer: Medicare Other | Admitting: *Deleted

## 2019-07-09 DIAGNOSIS — Z23 Encounter for immunization: Secondary | ICD-10-CM | POA: Diagnosis not present

## 2019-10-31 ENCOUNTER — Other Ambulatory Visit: Payer: Self-pay

## 2019-10-31 ENCOUNTER — Ambulatory Visit (INDEPENDENT_AMBULATORY_CARE_PROVIDER_SITE_OTHER): Payer: Medicare Other | Admitting: Family Medicine

## 2019-10-31 NOTE — Progress Notes (Signed)
Patient had come into the office, spoke with his wife, attempted to call back on 2 occasions and his phone would not even ring through so I do not know if there is power off or not.  His phone would not even ring through though. Caryl Pina, MD [facility] 10/31/2019, 4:03 PM

## 2019-11-03 ENCOUNTER — Telehealth: Payer: Self-pay | Admitting: Family Medicine

## 2019-11-03 ENCOUNTER — Other Ambulatory Visit: Payer: Self-pay | Admitting: Family Medicine

## 2019-11-03 DIAGNOSIS — E782 Mixed hyperlipidemia: Secondary | ICD-10-CM

## 2019-11-03 NOTE — Telephone Encounter (Signed)
Per patient's wife he needs to be scheduled for follow up of chronic medical conditions.  Appointment scheduled for 11/19/2019 with Dr. Warrick Parisian.

## 2019-11-18 ENCOUNTER — Other Ambulatory Visit: Payer: Self-pay

## 2019-11-19 ENCOUNTER — Ambulatory Visit (INDEPENDENT_AMBULATORY_CARE_PROVIDER_SITE_OTHER): Payer: Medicare Other | Admitting: Family Medicine

## 2019-11-19 ENCOUNTER — Other Ambulatory Visit: Payer: Self-pay

## 2019-11-19 ENCOUNTER — Encounter: Payer: Self-pay | Admitting: Family Medicine

## 2019-11-19 VITALS — BP 139/63 | HR 57 | Temp 98.7°F | Ht 67.0 in | Wt 154.2 lb

## 2019-11-19 DIAGNOSIS — I480 Paroxysmal atrial fibrillation: Secondary | ICD-10-CM | POA: Diagnosis not present

## 2019-11-19 DIAGNOSIS — Z131 Encounter for screening for diabetes mellitus: Secondary | ICD-10-CM | POA: Diagnosis not present

## 2019-11-19 DIAGNOSIS — M19012 Primary osteoarthritis, left shoulder: Secondary | ICD-10-CM

## 2019-11-19 DIAGNOSIS — E782 Mixed hyperlipidemia: Secondary | ICD-10-CM

## 2019-11-19 NOTE — Progress Notes (Signed)
BP 139/63   Pulse (!) 57   Temp 98.7 F (37.1 C)   Ht 5' 7"  (1.702 m)   Wt 154 lb 4 oz (70 kg)   SpO2 99%   BMI 24.16 kg/m    Subjective:   Patient ID: Kyle [name], male    DOB: October 07, 1934, 85 y.o.   MRN: [id number]  HPI: Kyle [name] is a 85 y.o. male presenting on 11/19/2019 for Medical Management of Chronic Issues and Shoulder Pain (left...thinks arthrits)   HPI Left shoulder pain and arthritis Patient has been having gradually increasing left shoulder pain arthritis is been going on over the past year.  Been worse over the past few months.  Patient has been taken Tylenol arthritis and Goody powders sometimes for these and they do help keep it under control.  Hyperlipidemia Patient is coming in for recheck of his hyperlipidemia. The patient is currently taking fish oil and atorvastatin. They deny any issues with myalgias or history of liver damage from it. They deny any focal numbness or weakness or chest pain.   Patient has a history of A. fib but rate seems regular I do not hear the abnormalities, please currently not on anticoagulation.  Relevant past medical, surgical, family and social history reviewed and updated as indicated. Interim medical history since our last visit reviewed. Allergies and medications reviewed and updated.  Review of Systems  Constitutional: Negative for chills and fever.  Eyes: Negative for visual disturbance.  Respiratory: Negative for shortness of breath and wheezing.   Cardiovascular: Negative for chest pain and leg swelling.  Musculoskeletal: Positive for arthralgias. Negative for back pain and gait problem.  Skin: Negative for rash.  Neurological: Negative for dizziness, weakness and light-headedness.  All other systems reviewed and are negative.   Per HPI unless specifically indicated above   Allergies as of 11/19/2019      Reactions   Penicillins    REACTION: dizziness/syncope      Medication List       Accurate as of November 19, 2019 12:04 PM. If you have any questions, ask your nurse or doctor.        aspirin 81 MG tablet Take 81 mg by mouth daily.   atorvastatin 20 MG tablet Commonly known as: LIPITOR TAKE 1 TABLET BY MOUTH ONCE DAILY. What changed: when to take this   fish oil-omega-3 fatty acids 1000 MG capsule Take 1 capsule by mouth daily.   GOODYS EXTRA STRENGTH PO Take by mouth as needed. Arthritis        Objective:   BP 139/63   Pulse (!) 57   Temp 98.7 F (37.1 C)   Ht 5' 7"  (1.702 m)   Wt 154 lb 4 oz (70 kg)   SpO2 99%   BMI 24.16 kg/m   Wt Readings from Last 3 Encounters:  11/19/19 154 lb 4 oz (70 kg)  10/30/18 155 lb 12.8 oz (70.7 kg)  10/29/17 157 lb 3.2 oz (71.3 kg)    Physical Exam Vitals and nursing note reviewed.  Constitutional:      General: He is not in acute distress.    Appearance: He is well-developed. He is not diaphoretic.  Eyes:     General: No scleral icterus.    Conjunctiva/sclera: Conjunctivae normal.  Neck:     Thyroid: No thyromegaly.  Cardiovascular:     Rate and Rhythm: Normal rate and regular rhythm.     Heart sounds: Normal heart sounds. No murmur.  Pulmonary:     Effort: Pulmonary effort is normal. No respiratory distress.     Breath sounds: Normal breath sounds. No wheezing.  Musculoskeletal:        General: Tenderness (No tenderness to palpation but does have some pain with range of motion and grinding, consistent with arthritis.) present. Normal range of motion.     Cervical back: Neck supple.  Lymphadenopathy:     Cervical: No cervical adenopathy.  Skin:    General: Skin is warm and dry.     Findings: No rash.  Neurological:     Mental Status: He is alert and oriented to person, place, and time.     Coordination: Coordination normal.  Psychiatric:        Behavior: Behavior normal.       Assessment & Plan:   Problem List Items Addressed This Visit      Cardiovascular and Mediastinum   Atrial fibrillation ([place])   Relevant  Orders   CBC with Differential/Platelet     Other   Hyperlipidemia - Primary   Relevant Orders   Lipid panel   CBC with Differential/Platelet   CMP14+EGFR    Other Visit Diagnoses    Diabetes mellitus screening       Relevant Orders   CMP14+EGFR   Primary osteoarthritis of left shoulder       Relevant Medications   Aspirin-Acetaminophen-Caffeine (GOODYS EXTRA STRENGTH PO)      Continue with the Goody powders, he can get injections in the future, he did not want one at this point but he will consider it if he needs one in the future. Follow up plan: Return in about 6 months (around 05/21/2020), or if symptoms worsen or fail to improve, for Hyperlipidemia recheck.  Counseling provided for all of the vaccine components Orders Placed This Encounter  Procedures  . Lipid panel  . CBC with Differential/Platelet  . [place] Tameron Lama, MD [facility] 11/19/2019, 12:04 PM

## 2019-11-20 LAB — CMP14+EGFR
ALT: 7 IU/L (ref 0–44)
AST: 15 IU/L (ref 0–40)
Albumin/Globulin Ratio: 2 (ref 1.2–2.2)
Albumin: 4.3 g/dL (ref 3.6–4.6)
Alkaline Phosphatase: 61 IU/L (ref 39–117)
BUN/Creatinine Ratio: 12 (ref 10–24)
BUN: 9 mg/dL (ref 8–27)
Bilirubin Total: 0.5 mg/dL (ref 0.0–1.2)
CO2: 25 mmol/L (ref 20–29)
Calcium: 9.6 mg/dL (ref 8.6–10.2)
Chloride: 102 mmol/L (ref 96–106)
Creatinine, Ser: 0.75 mg/dL — ABNORMAL LOW (ref 0.76–1.27)
GFR calc Af Amer: 99 mL/min/{1.73_m2} (ref >59)
GFR calc non Af Amer: 85 mL/min/{1.73_m2} (ref >59)
Globulin, Total: 2.1 g/dL (ref 1.5–4.5)
Glucose: 101 mg/dL — ABNORMAL HIGH (ref 65–99)
Potassium: 4.7 mmol/L (ref 3.5–5.2)
Sodium: 141 mmol/L (ref 134–144)
Total Protein: 6.4 g/dL (ref 6.0–8.5)

## 2019-11-20 LAB — CBC WITH DIFFERENTIAL/PLATELET
Basophils Absolute: 0.1 10*3/uL (ref 0.0–0.2)
Basos: 1 %
EOS (ABSOLUTE): 0.1 10*3/uL (ref 0.0–0.4)
Eos: 1 %
Hematocrit: 37.9 % (ref 37.5–51.0)
Hemoglobin: 12.8 g/dL — ABNORMAL LOW (ref 13.0–17.7)
Immature Grans (Abs): 0 10*3/uL (ref 0.0–0.1)
Immature Granulocytes: 0 %
Lymphocytes Absolute: 2.8 10*3/uL (ref 0.7–3.1)
Lymphs: 32 %
MCH: 29.6 pg (ref 26.6–33.0)
MCHC: 33.8 g/dL (ref 31.5–35.7)
MCV: 88 fL (ref 79–97)
Monocytes Absolute: 0.6 10*3/uL (ref 0.1–0.9)
Monocytes: 6 %
Neutrophils Absolute: 5.2 10*3/uL (ref 1.4–7.0)
Neutrophils: 60 %
Platelets: 282 10*3/uL (ref 150–450)
RBC: 4.32 x10E6/uL (ref 4.14–5.80)
RDW: 12.8 % (ref 11.6–15.4)
WBC: 8.7 10*3/uL (ref 3.4–10.8)

## 2019-11-20 LAB — LIPID PANEL
Chol/HDL Ratio: 4.1 ratio (ref 0.0–5.0)
Cholesterol, Total: 197 mg/dL (ref 100–199)
HDL: 48 mg/dL (ref >39)
LDL Chol Calc (NIH): 132 mg/dL — ABNORMAL HIGH (ref 0–99)
Triglycerides: 95 mg/dL (ref 0–149)
VLDL Cholesterol Cal: 17 mg/dL (ref 5–40)

## 2019-12-08 ENCOUNTER — Ambulatory Visit: Payer: Medicare Other

## 2020-02-04 ENCOUNTER — Other Ambulatory Visit: Payer: Self-pay | Admitting: Family Medicine

## 2020-02-04 DIAGNOSIS — E782 Mixed hyperlipidemia: Secondary | ICD-10-CM

## 2020-04-24 DIAGNOSIS — H40033 Anatomical narrow angle, bilateral: Secondary | ICD-10-CM | POA: Diagnosis not present

## 2020-04-24 DIAGNOSIS — H2513 Age-related nuclear cataract, bilateral: Secondary | ICD-10-CM | POA: Diagnosis not present

## 2020-05-21 ENCOUNTER — Ambulatory Visit (INDEPENDENT_AMBULATORY_CARE_PROVIDER_SITE_OTHER): Payer: Medicare Other | Admitting: Family Medicine

## 2020-05-21 ENCOUNTER — Encounter: Payer: Self-pay | Admitting: Family Medicine

## 2020-05-21 ENCOUNTER — Other Ambulatory Visit: Payer: Self-pay

## 2020-05-21 VITALS — BP 149/57 | HR 47 | Temp 97.7°F | Ht 67.0 in | Wt 154.0 lb

## 2020-05-21 DIAGNOSIS — E782 Mixed hyperlipidemia: Secondary | ICD-10-CM | POA: Diagnosis not present

## 2020-05-21 MED ORDER — ATORVASTATIN CALCIUM 20 MG PO TABS: 20.0000 mg | ORAL_TABLET | Freq: Every day | ORAL | 3 refills | Status: DC

## 2020-05-21 NOTE — Progress Notes (Signed)
BP (!) 149/57   Pulse (!) 47   Temp 97.7 F (36.5 C)   Ht 5' 7"  (1.702 m)   Wt 154 lb (69.9 kg)   SpO2 97%   BMI 24.12 kg/m    Subjective:   Patient ID: Kyle [name], male    DOB: 05-06-1935, 85 y.o.   MRN: [id number]  HPI: Kyle [name] is a 85 y.o. male presenting on 05/21/2020 for Medical Management of Chronic Issues and Hyperlipidemia   HPI Hyperlipidemia Patient is coming in for recheck of his hyperlipidemia. The patient is currently taking atorvastatin and fish oil. They deny any issues with myalgias or history of liver damage from it. They deny any focal numbness or weakness or chest pain.   Relevant past medical, surgical, family and social history reviewed and updated as indicated. Interim medical history since our last visit reviewed. Allergies and medications reviewed and updated.  Review of Systems  Constitutional: Negative for chills and fever.  Respiratory: Negative for shortness of breath and wheezing.   Cardiovascular: Negative for chest pain, palpitations and leg swelling.  Musculoskeletal: Negative for back pain and gait problem.  Skin: Negative for rash.  Neurological: Negative for dizziness, weakness and light-headedness.  All other systems reviewed and are negative.   Per HPI unless specifically indicated above   Allergies as of 05/21/2020      Reactions   Penicillins    REACTION: dizziness/syncope      Medication List       Accurate as of May 21, 2020 11:35 AM. If you have any questions, ask your nurse or doctor.        aspirin 81 MG tablet Take 81 mg by mouth daily.   atorvastatin 20 MG tablet Commonly known as: LIPITOR TAKE 1 TABLET BY MOUTH ONCE DAILY.   fish oil-omega-3 fatty acids 1000 MG capsule Take 1 capsule by mouth daily.   GOODYS EXTRA STRENGTH PO Take by mouth as needed. Arthritis        Objective:   BP (!) 149/57   Pulse (!) 47   Temp 97.7 F (36.5 C)   Ht 5' 7"  (1.702 m)   Wt 154 lb (69.9 kg)   SpO2  97%   BMI 24.12 kg/m   Wt Readings from Last 3 Encounters:  05/21/20 154 lb (69.9 kg)  11/19/19 154 lb 4 oz (70 kg)  10/30/18 155 lb 12.8 oz (70.7 kg)    Physical Exam Vitals and nursing note reviewed.  Constitutional:      General: He is not in acute distress.    Appearance: He is well-developed. He is not diaphoretic.  Eyes:     General: No scleral icterus.    Conjunctiva/sclera: Conjunctivae normal.  Neck:     Thyroid: No thyromegaly.  Cardiovascular:     Rate and Rhythm: Normal rate. Rhythm irregular.     Heart sounds: Normal heart sounds. No murmur heard.   Pulmonary:     Effort: Pulmonary effort is normal. No respiratory distress.     Breath sounds: Normal breath sounds. No wheezing or rales.  Musculoskeletal:        General: Normal range of motion.     Cervical back: Neck supple.  Lymphadenopathy:     Cervical: No cervical adenopathy.  Skin:    General: Skin is warm and dry.     Findings: No rash.  Neurological:     Mental Status: He is alert and oriented to person, place, and time.  Coordination: Coordination normal.  Psychiatric:        Behavior: Behavior normal.       Assessment & Plan:   Problem List Items Addressed This Visit      Other   Hyperlipidemia - Primary   Relevant Medications   atorvastatin (LIPITOR) 20 MG tablet   Other Relevant Orders   CBC with Differential/Platelet   CMP14+EGFR   Lipid panel       Follow up plan: Return in about 1 year (around 05/21/2021), or if symptoms worsen or fail to improve, for Hyperlipidemia recheck.  Counseling provided for all of the vaccine components No orders of the defined types were placed in this encounter.   Caryl Pina, MD [facility] 05/21/2020, 11:35 AM

## 2020-05-22 LAB — CMP14+EGFR
ALT: 9 IU/L (ref 0–44)
AST: 15 IU/L (ref 0–40)
Albumin/Globulin Ratio: 2.2 (ref 1.2–2.2)
Albumin: 4.4 g/dL (ref 3.6–4.6)
Alkaline Phosphatase: 59 IU/L (ref 48–121)
BUN/Creatinine Ratio: 13 (ref 10–24)
BUN: 10 mg/dL (ref 8–27)
Bilirubin Total: 0.5 mg/dL (ref 0.0–1.2)
CO2: 25 mmol/L (ref 20–29)
Calcium: 9.3 mg/dL (ref 8.6–10.2)
Chloride: 99 mmol/L (ref 96–106)
Creatinine, Ser: 0.76 mg/dL (ref 0.76–1.27)
GFR calc Af Amer: 98 mL/min/{1.73_m2} (ref >59)
GFR calc non Af Amer: 84 mL/min/{1.73_m2} (ref >59)
Globulin, Total: 2 g/dL (ref 1.5–4.5)
Glucose: 95 mg/dL (ref 65–99)
Potassium: 4.4 mmol/L (ref 3.5–5.2)
Sodium: 137 mmol/L (ref 134–144)
Total Protein: 6.4 g/dL (ref 6.0–8.5)

## 2020-05-22 LAB — CBC WITH DIFFERENTIAL/PLATELET
Basophils Absolute: 0 10*3/uL (ref 0.0–0.2)
Basos: 1 %
EOS (ABSOLUTE): 0.1 10*3/uL (ref 0.0–0.4)
Eos: 1 %
Hematocrit: 36.8 % — ABNORMAL LOW (ref 37.5–51.0)
Hemoglobin: 12.7 g/dL — ABNORMAL LOW (ref 13.0–17.7)
Immature Grans (Abs): 0 10*3/uL (ref 0.0–0.1)
Immature Granulocytes: 0 %
Lymphocytes Absolute: 3.2 10*3/uL — ABNORMAL HIGH (ref 0.7–3.1)
Lymphs: 39 %
MCH: 30.5 pg (ref 26.6–33.0)
MCHC: 34.5 g/dL (ref 31.5–35.7)
MCV: 88 fL (ref 79–97)
Monocytes Absolute: 0.5 10*3/uL (ref 0.1–0.9)
Monocytes: 6 %
Neutrophils Absolute: 4.3 10*3/uL (ref 1.4–7.0)
Neutrophils: 53 %
Platelets: 254 10*3/uL (ref 150–450)
RBC: 4.17 x10E6/uL (ref 4.14–5.80)
RDW: 13.1 % (ref 11.6–15.4)
WBC: 8.2 10*3/uL (ref 3.4–10.8)

## 2020-05-22 LAB — LIPID PANEL
Chol/HDL Ratio: 4.5 ratio (ref 0.0–5.0)
Cholesterol, Total: 195 mg/dL (ref 100–199)
HDL: 43 mg/dL (ref >39)
LDL Chol Calc (NIH): 133 mg/dL — ABNORMAL HIGH (ref 0–99)
Triglycerides: 103 mg/dL (ref 0–149)
VLDL Cholesterol Cal: 19 mg/dL (ref 5–40)

## 2020-08-16 ENCOUNTER — Encounter (HOSPITAL_COMMUNITY): Payer: Self-pay

## 2020-08-16 ENCOUNTER — Other Ambulatory Visit: Payer: Self-pay

## 2020-08-16 ENCOUNTER — Emergency Department (HOSPITAL_COMMUNITY): Payer: Medicare Other

## 2020-08-16 ENCOUNTER — Emergency Department (HOSPITAL_COMMUNITY)
Admission: EM | Admit: 2020-08-16 | Discharge: 2020-08-16 | Disposition: A | Discharge status: Home / Self Care | Payer: Medicare Other | Attending: Emergency Medicine | Admitting: Emergency Medicine

## 2020-08-16 DIAGNOSIS — Z959 Presence of cardiac and vascular implant and graft, unspecified: Secondary | ICD-10-CM | POA: Insufficient documentation

## 2020-08-16 DIAGNOSIS — Y92017 Garden or yard in single-family (private) house as the place of occurrence of the external cause: Secondary | ICD-10-CM | POA: Insufficient documentation

## 2020-08-16 DIAGNOSIS — I2510 Atherosclerotic heart disease of native coronary artery without angina pectoris: Secondary | ICD-10-CM | POA: Diagnosis not present

## 2020-08-16 DIAGNOSIS — Z7982 Long term (current) use of aspirin: Secondary | ICD-10-CM | POA: Diagnosis not present

## 2020-08-16 DIAGNOSIS — W19XXXA Unspecified fall, initial encounter: Secondary | ICD-10-CM

## 2020-08-16 DIAGNOSIS — M7989 Other specified soft tissue disorders: Secondary | ICD-10-CM | POA: Diagnosis not present

## 2020-08-16 DIAGNOSIS — S52201A Unspecified fracture of shaft of right ulna, initial encounter for closed fracture: Secondary | ICD-10-CM | POA: Diagnosis not present

## 2020-08-16 DIAGNOSIS — S52021A Displaced fracture of olecranon process without intraarticular extension of right ulna, initial encounter for closed fracture: Secondary | ICD-10-CM | POA: Diagnosis not present

## 2020-08-16 DIAGNOSIS — W0110XA Fall on same level from slipping, tripping and stumbling with subsequent striking against unspecified object, initial encounter: Secondary | ICD-10-CM | POA: Diagnosis not present

## 2020-08-16 DIAGNOSIS — Z87891 Personal history of nicotine dependence: Secondary | ICD-10-CM | POA: Diagnosis not present

## 2020-08-16 DIAGNOSIS — S59901A Unspecified injury of right elbow, initial encounter: Secondary | ICD-10-CM | POA: Diagnosis present

## 2020-08-16 DIAGNOSIS — M25531 Pain in right wrist: Secondary | ICD-10-CM | POA: Diagnosis not present

## 2020-08-16 NOTE — ED Triage Notes (Signed)
Pt fell last Thursday putting up lights from standing, hit elbow on gravel. Redness and swelling noted. States tightness due to swelling, pain on movement, limited ROM.

## 2020-08-16 NOTE — Discharge Instructions (Addendum)
Fracture care There is evidence of a fracture on the x-ray. Pain:  Acetaminophen: May take acetaminophen (generic for Tylenol), as needed, for pain. Your daily total maximum amount of acetaminophen from all sources should be limited to 4000mg /day for persons without liver problems, or 2000mg /day for those with liver problems. Ice: May apply ice to the injured area for no more than 15 minutes at a time to reduce swelling and pain. Elevation: Keep the extremity elevated whenever possible to reduce swelling and pain. Sling: Keep the arm in the sling most of the time to provide support and comfort to the injury.  This may be removed for bathing.  Adjustment of the sling would be necessary with neck pain, extremity tingling/numbness, paleness in the extremity, new weakness.   Follow-up: Follow-up with the orthopedic specialist in the office.  They request you come to an appointment at 8:30 AM on Wednesday, December 8. Return: Return to the emergency department for severely increased pain, numbness, blanching of the skin, or any other major concerns.

## 2020-08-16 NOTE — ED Provider Notes (Signed)
[facility] EMERGENCY DEPARTMENT Provider Note   CSN: [id number] Arrival date & time: 08/16/20  1237     History Chief Complaint  Patient presents with  . Arm Injury    Kyle [name] is a 85 y.o. male.  HPI      Kyle [name] is a 85 y.o. male, with a history of A. fib, mitral regurg, presenting to the ED with right elbow injury that occurred Thursday, December 2.  Patient was working in the yard, tripped over some uneven stones on the pathway, and fell with his elbow against a retaining wall. He notes pain that is mostly present with extremes of extension or flexion of the elbow, though even this is mild to moderate. He came into the ED today because he was still having pain and wanted to get it checked out. He denies anticoagulation. Denies head injury, neck/back pain, chest injury, abdominal injury, numbness, weakness, or any other complaints or injuries.  Past Medical History:  Diagnosis Date  . Atrial fibrillation ([place])    Post operative, maintaining normal sinus rhythm on amiodarone  . Benign prostatic hypertrophy   . CAD (coronary artery disease)    Non obstructive by catheterization Feb 2007 prior to his surgery revealing a 40-50% LAD lesion, good LV function with EF 55-60% by last echocardiogram prior to his surgery  . Hyperlipidemia    Treated  . Mitral regurgitation    S/P mitral valve repair Oct 17, 2005 by Dr. Roxy Manns  . Skin cancer     Patient Active Problem List   Diagnosis Date Noted  . Hyperlipidemia 05/07/2009  . MITRAL REGURGITATION 05/07/2009  . Atrial fibrillation ([place]) 05/07/2009    Past Surgical History:  Procedure Laterality Date  . CARDIAC CATHETERIZATION  10/2005  . COLONOSCOPY    . MITRAL VALVE REPAIR  10/17/2005   Dr. Roxy Manns  . POLYPECTOMY    . SKIN CANCER EXCISION Left    cheek       Family History  Problem Relation Age of Onset  . COPD Father   . Cancer Brother        melanoma  . Heart disease Neg Hx   . Colon cancer Neg Hx      Social History   Tobacco Use  . Smoking status: Former Smoker    Types: Cigarettes  . Smokeless tobacco: Never Used  Vaping Use  . Vaping Use: Never used  Substance Use Topics  . Alcohol use: No  . Drug use: No    Home Medications Prior to Admission medications   Medication Sig Start Date End Date Taking? Authorizing Provider  aspirin 81 MG tablet Take 81 mg by mouth daily.      [provider]  Aspirin-Acetaminophen-Caffeine (GOODYS EXTRA STRENGTH PO) Take by mouth as needed. Arthritis    [provider]  atorvastatin (LIPITOR) 20 MG tablet Take 1 tablet (20 mg total) by mouth daily. 05/21/20   Dettinger, Fransisca Kaufmann, MD  fish oil-omega-3 fatty acids 1000 MG capsule Take 1 capsule by mouth daily.      [provider]    Allergies    Penicillins  Review of Systems   Review of Systems  Musculoskeletal: Positive for arthralgias and joint swelling. Negative for back pain and neck pain.  Neurological: Negative for weakness, numbness and headaches.    Physical Exam Updated Vital Signs BP (!) 165/58   Pulse (!) 57   Temp 97.8 F (36.6 C) (Oral)   Resp 18  Ht 5' 7.75" (1.721 m)   Wt 70.3 kg   SpO2 97%   BMI 23.74 kg/m   Physical Exam Vitals and nursing note reviewed.  Constitutional:      General: He is not in acute distress.    Appearance: He is well-developed. He is not diaphoretic.  HENT:     Head: Normocephalic and atraumatic.  Eyes:     Conjunctiva/sclera: Conjunctivae normal.  Cardiovascular:     Rate and Rhythm: Normal rate and regular rhythm.     Pulses:          Radial pulses are 2+ on the right side.  Pulmonary:     Effort: Pulmonary effort is normal.  Musculoskeletal:     Cervical back: Neck supple.     Comments: Tenderness and swelling to the right posterior elbow.  Ecchymosis in the region of the elbow, distal triceps as well as in the right forearm and hand. Patient notes the bruising and swelling in the right forearm  and hand arose over time since the injury.  There is no pain or tenderness in the right forearm or hand. Patient does have an area of concentrated swelling/mass in the region of the distal triceps about a third of the way superior to the right elbow. He does appear to have full range of motion in the right elbow. Full supination and pronation of the right forearm as well as full range of motion through the cardinal directions of the right wrist.  Normal motor function intact in all extremities. No midline spinal tenderness.   Skin:    General: Skin is warm and dry.     Capillary Refill: Capillary refill takes less than 2 seconds.     Coloration: Skin is not pale.  Neurological:     Mental Status: He is alert.     Comments: Sensation to light touch grossly intact in the bilateral upper extremities. Grip strength equal bilaterally. Strength 5/5 in the right shoulder. Strength 5/5 with flexion of the right elbow. Patient has no engagement of the triceps with attempted forced extension at the right elbow. Strength 5/5 in the right wrist.  Psychiatric:        Behavior: Behavior normal.     ED Results / Procedures / Treatments   Labs (all labs ordered are listed, but only abnormal results are displayed) Labs Reviewed - No data to display  EKG None  Radiology DG Elbow Complete Right  Result Date: 08/16/2020 CLINICAL DATA:  [place] 4 days ago. Persistent elbow pain and swelling. EXAM: RIGHT ELBOW - COMPLETE 3+ VIEW COMPARISON:  None. FINDINGS: There is an avulsion type fracture involving the olecranon with significant displacement due to the pull of the triceps tendon. No humerus fracture is identified. The radius is intact. There is diffuse and fairly marked subcutaneous soft tissue swelling. IMPRESSION: Displaced olecranon fracture due to the unopposed pull of the triceps tendon. Electronically Signed   By: Marijo Sanes [name]   On: 08/16/2020 13:37   DG Wrist Complete Right  Result Date:  08/16/2020 CLINICAL DATA:  [place] 4 days ago. Right wrist pain. EXAM: RIGHT WRIST - COMPLETE 3+ VIEW COMPARISON:  None. FINDINGS: The joint spaces are maintained. Mild age related degenerative changes but no acute wrist or proximal hand fracture is identified. IMPRESSION: No acute bony findings. Electronically Signed   By: Marijo Sanes [name]   On: 08/16/2020 13:37    Procedures Procedures (including critical care time)  Medications Ordered in ED Medications - No  data to display  ED Course  I have reviewed the triage vital signs and the nursing notes.  Pertinent labs & imaging results that were available during my care of the patient were reviewed by me and considered in my medical decision making (see chart for details).  Clinical Course as of Aug 16 1510  Mon Aug 16, 2020  1422 Spoke with Dr. Percell Miller, orthopedic surgeon. States patient can be placed in a sling and he will see him in the office Wednesday, December 8 at 8:30 AM.   [SJ]    Clinical Course User Index [SJ] Jiovanna Frei C, PA-C   MDM Rules/Calculators/A&P                          Patient presents several days after injury to the right elbow. I personally reviewed and interpreted his imaging studies. Evidence of olecranon fracture on x-ray.  I suspect his ecchymosis in the forearm and hand are dependent originating from his original injury rather than primary injury in these locations. Patient placed in a sling with confirmation of intact sensation, motor function, and circulation after sling placement.  Patient endorses increased comfort with sling in place. Patient will follow up with orthopedics. The patient was given instructions for home care as well as return precautions. Patient voices understanding of these instructions, accepts the plan, and is comfortable with discharge.  Findings and plan of care discussed with Fredia Sorrow, MD. Dr. Rogene Houston personally evaluated and examined this patient.   Vitals:   08/16/20  1302 08/16/20 1308 08/16/20 1500  BP:  (!) 165/58 (!) 145/82  Pulse: (!) 57  81  Resp: 18  20  Temp: 97.8 F (36.6 C)  98.3 F (36.8 C)  TempSrc: Oral  Oral  SpO2: 97%  95%  Weight: 70.3 kg    Height: 5' 7.75" (1.721 m)       Final Clinical Impression(s) / ED Diagnoses Final diagnoses:  Fall, initial encounter  Closed fracture of olecranon process of right ulna, initial encounter    Rx / DC Orders ED Discharge Orders    None       Layla Maw 08/16/20 1515    Fredia Sorrow, MD 08/17/20 1538

## 2020-08-18 DIAGNOSIS — M25521 Pain in right elbow: Secondary | ICD-10-CM | POA: Diagnosis not present

## 2020-08-20 DIAGNOSIS — G8918 Other acute postprocedural pain: Secondary | ICD-10-CM | POA: Diagnosis not present

## 2020-08-20 DIAGNOSIS — S52031A Displaced fracture of olecranon process with intraarticular extension of right ulna, initial encounter for closed fracture: Secondary | ICD-10-CM | POA: Diagnosis not present

## 2020-08-20 DIAGNOSIS — S82031A Displaced transverse fracture of right patella, initial encounter for closed fracture: Secondary | ICD-10-CM | POA: Diagnosis not present

## 2020-08-20 DIAGNOSIS — M7021 Olecranon bursitis, right elbow: Secondary | ICD-10-CM | POA: Diagnosis not present

## 2020-08-20 DIAGNOSIS — S52021A Displaced fracture of olecranon process without intraarticular extension of right ulna, initial encounter for closed fracture: Secondary | ICD-10-CM | POA: Diagnosis not present

## 2020-08-30 DIAGNOSIS — M7021 Olecranon bursitis, right elbow: Secondary | ICD-10-CM | POA: Diagnosis not present

## 2020-09-29 DIAGNOSIS — M7021 Olecranon bursitis, right elbow: Secondary | ICD-10-CM | POA: Diagnosis not present

## 2021-01-03 DIAGNOSIS — C44321 Squamous cell carcinoma of skin of nose: Secondary | ICD-10-CM | POA: Diagnosis not present

## 2021-01-03 DIAGNOSIS — C44311 Basal cell carcinoma of skin of nose: Secondary | ICD-10-CM | POA: Diagnosis not present

## 2021-08-23 DIAGNOSIS — H2513 Age-related nuclear cataract, bilateral: Secondary | ICD-10-CM | POA: Diagnosis not present

## 2021-08-23 DIAGNOSIS — H40033 Anatomical narrow angle, bilateral: Secondary | ICD-10-CM | POA: Diagnosis not present

## 2021-08-30 ENCOUNTER — Ambulatory Visit (INDEPENDENT_AMBULATORY_CARE_PROVIDER_SITE_OTHER): Payer: Medicare Other

## 2021-08-30 VITALS — Ht 67.0 in | Wt 154.0 lb

## 2021-08-30 DIAGNOSIS — Z0000 Encounter for general adult medical examination without abnormal findings: Secondary | ICD-10-CM

## 2021-08-30 NOTE — Progress Notes (Signed)
Subjective:   Kyle [name] is a 86 y.o. male who presents for an Initial Medicare Annual Wellness Visit.  Virtual Visit via Telephone Note  I connected with  Kyle [name] on 08/30/21 at  3:30 PM EST by telephone and verified that I am speaking with the correct person using two identifiers.  Location: Patient: Home Provider: [facility] Persons participating in the virtual visit: patient/Nurse Health Advisor   I discussed the limitations, risks, security and privacy concerns of performing an evaluation and management service by telephone and the availability of in person appointments. The patient expressed understanding and agreed to proceed.  Interactive audio and video telecommunications were attempted between this nurse and patient, however failed, due to patient having technical difficulties OR patient did not have access to video capability.  We continued and completed visit with audio only.  Some vital signs may be absent or patient reported.   Myla Mauriello E Karlo Goeden, LPN   Review of Systems     Cardiac Risk Factors include: advanced age (>74men, >24 women);dyslipidemia;Other (see comment), Risk factor comments: atrial fibrillation     Objective:    Today's Vitals   08/30/21 1529  Weight: 154 lb (69.9 kg)  Height: 5\' 7"  (1.702 m)   Body mass index is 24.12 kg/m.  Advanced Directives 08/30/2021 08/12/2014  Does Patient Have a Medical Advance Directive? No No  Would patient like information on creating a medical advance directive? No - Patient declined -    Current Medications (verified) Outpatient Encounter Medications as of 08/30/2021  Medication Sig   aspirin 81 MG tablet Take 81 mg by mouth daily.     Aspirin-Acetaminophen-Caffeine (GOODYS EXTRA STRENGTH PO) Take by mouth as needed. Arthritis   atorvastatin (LIPITOR) 20 MG tablet Take 1 tablet (20 mg total) by mouth daily.   fish oil-omega-3 fatty acids 1000 MG capsule Take 1 capsule by mouth daily.     No  facility-administered encounter medications on file as of 08/30/2021.    Allergies (verified) Penicillins   History: Past Medical History:  Diagnosis Date   Atrial fibrillation ([place])    Post operative, maintaining normal sinus rhythm on amiodarone   Benign prostatic hypertrophy    CAD (coronary artery disease)    Non obstructive by catheterization Feb 2007 prior to his surgery revealing a 40-50% LAD lesion, good LV function with EF 55-60% by last echocardiogram prior to his surgery   Hyperlipidemia    Treated   Mitral regurgitation    S/P mitral valve repair Oct 17, 2005 by Dr. Roxy Manns   Skin cancer    Past Surgical History:  Procedure Laterality Date   CARDIAC CATHETERIZATION  10/2005   COLONOSCOPY     MITRAL VALVE REPAIR  10/17/2005   Dr. Roxy Manns   POLYPECTOMY     SKIN CANCER EXCISION Left    cheek   Family History  Problem Relation Age of Onset   COPD Father    Cancer Brother        melanoma   Heart disease Neg Hx    Colon cancer Neg Hx    Social History   Socioeconomic History   Marital status: Married    Spouse name: Not on file   Number of children: 2   Years of education: Not on file   Highest education level: Not on file  Occupational History   Occupation: Retired from plumbing  Tobacco Use   Smoking status: Former    Types: Cigarettes   Smokeless tobacco: Never  Vaping Use   Vaping Use: Never used  Substance and Sexual Activity   Alcohol use: No   Drug use: No   Sexual activity: Not on file  Other Topics Concern   Not on file  Social History Narrative   Married. Lives in [place]   His wife is paralyzed and he cares for her - bathing her, etc   She is now under hospice care in home.   Social Determinants of Health   Financial Resource Strain: Low Risk    Difficulty of Paying Living Expenses: Not hard at all  Food Insecurity: No Food Insecurity   Worried About Charity fundraiser in the Last Year: Never true   [place] in the Last  Year: Never true  Transportation Needs: No Transportation Needs   Lack of Transportation (Medical): No   Lack of Transportation (Non-Medical): No  Physical Activity: Sufficiently Active   Days of Exercise per Week: 7 days   Minutes of Exercise per Session: 30 min  Stress: No Stress Concern Present   Feeling of Stress : Only a little  Social Connections: Engineer, building services of Communication with Friends and Family: More than three times a week   Frequency of Social Gatherings with Friends and Family: More than three times a week   Attends Religious Services: 1 to 4 times per year   Active Member of [employer] or Organizations: Yes   Attends Archivist Meetings: 1 to 4 times per year   Marital Status: Married    Tobacco Counseling Counseling given: Not Answered   Clinical Intake:  Pre-visit preparation completed: Yes  Pain : No/denies pain     BMI - recorded: 24.12 Nutritional Status: BMI of 19-24  Normal Nutritional Risks: None Diabetes: No  How often do you need to have someone help you when you read instructions, pamphlets, or other written materials from your doctor or pharmacy?: 1 - Never  Diabetic? no  Interpreter Needed?: No  Information entered by :: Santosh Petter, LPN   Activities of Daily Living In your present state of health, do you have any difficulty performing the following activities: 08/30/2021  Hearing? N  Vision? N  Difficulty concentrating or making decisions? N  Walking or climbing stairs? N  Dressing or bathing? N  Doing errands, shopping? N  Preparing Food and eating ? N  Using the Toilet? N  In the past six months, have you accidently leaked urine? N  Do you have problems with loss of bowel control? N  Managing your Medications? N  Managing your Finances? N  Housekeeping or managing your Housekeeping? N  Some recent data might be hidden    Patient Care Team: Dettinger, Fransisca Kaufmann, MD as PCP - General (Family  Medicine)  Indicate any recent Medical Services you may have received from other than [facility] providers in the past year (date may be approximate).     Assessment:   This is a routine wellness examination for Kyle [name].  Hearing/Vision screen Hearing Screening - Comments:: Denies hearing difficulties  Vision Screening - Comments:: Wears rx glasses - up to date with annual eye exams with [facility]  Dietary issues and exercise activities discussed: Current Exercise Habits: Home exercise routine, Type of exercise: walking;Other - see comments (lifting wife who is paralyzed), Time (Minutes): 30, Frequency (Times/Week): 7, Weekly Exercise (Minutes/Week): 210, Intensity: Mild, Exercise limited by: cardiac condition(s)   Goals Addressed  This Visit's Progress    Patient Stated       Stay healthy and active       Depression Screen PHQ 2/9 Scores 08/30/2021 05/21/2020 11/19/2019 10/30/2018 10/29/2017 04/26/2017 10/26/2016  PHQ - 2 Score 0 0 0 0 0 0 0    Fall Risk Fall Risk  08/30/2021 05/21/2020 11/19/2019 10/30/2018 10/29/2017  Falls in the past year? 1 0 0 0 No  Number falls in past yr: 1 - - - -  Injury with Fall? 0 - - - -  Risk for fall due to : History of fall(s) - - - -  Follow up Falls prevention discussed;Education provided - - - -    FALL RISK PREVENTION PERTAINING TO THE HOME:  Any stairs in or around the home? Yes  If so, are there any without handrails? No  Home free of loose throw rugs in walkways, pet beds, electrical cords, etc? Yes  Adequate lighting in your home to reduce risk of falls? Yes   ASSISTIVE DEVICES UTILIZED TO PREVENT FALLS:  Life alert? No  Use of a cane, walker or w/c? No  Grab bars in the bathroom? Yes  Shower chair or bench in shower? Yes  Elevated toilet seat or a handicapped toilet? Yes   TIMED UP AND GO:  Was the test performed? No . Telephonic visit  Cognitive Function:     6CIT Screen 08/30/2021  What Year? 0  points  What month? 0 points  What time? 0 points  Count back from 20 0 points  Months in reverse 0 points  Repeat phrase 6 points  Total Score 6    Immunizations Immunization History  Administered Date(s) Administered   Fluad Quad(high Dose 65+) 07/09/2019   Influenza, High Dose Seasonal PF 06/26/2013, 06/12/2016, 06/18/2017, 06/24/2018   Influenza,inj,Quad PF,6+ Mos 06/15/2014, 06/21/2015   Influenza-Unspecified 07/07/2009, 06/07/2010, 07/05/2011   Pneumococcal Conjugate-13 04/07/2015   Pneumococcal Polysaccharide-23 05/26/2011   Td 05/26/2011   Tdap 05/26/2011   Zoster, Live 08/19/2012    TDAP status: Up to date  Flu Vaccine status: Declined, Education has been provided regarding the importance of this vaccine but patient still declined. Advised may receive this vaccine at local pharmacy or Health Dept. Aware to provide a copy of the vaccination record if obtained from local pharmacy or Health Dept. Verbalized acceptance and understanding.  Pneumococcal vaccine status: Up to date  Covid-19 vaccine status: Declined, Education has been provided regarding the importance of this vaccine but patient still declined. Advised may receive this vaccine at local pharmacy or Health Dept.or vaccine clinic. Aware to provide a copy of the vaccination record if obtained from local pharmacy or Health Dept. Verbalized acceptance and understanding.  Qualifies for Shingles Vaccine? Yes   Zostavax completed Yes   Shingrix Completed?: No.    Education has been provided regarding the importance of this vaccine. Patient has been advised to call insurance company to determine out of pocket expense if they have not yet received this vaccine. Advised may also receive vaccine at local pharmacy or Health Dept. Verbalized acceptance and understanding.  Screening Tests Health Maintenance  Topic Date Due   COVID-19 Vaccine (1) Never done   Zoster Vaccines- Shingrix (1 of 2) Never done   INFLUENZA VACCINE   04/11/2021   TETANUS/TDAP  05/25/2021   Pneumonia Vaccine 27+ Years old  Completed   HPV VACCINES  Aged Out    Health Maintenance  Health Maintenance Due  Topic Date Due   COVID-19 Vaccine (  1) Never done   Zoster Vaccines- Shingrix (1 of 2) Never done   INFLUENZA VACCINE  04/11/2021   TETANUS/TDAP  05/25/2021    Colorectal cancer screening: No longer required.   Lung Cancer Screening: (Low Dose CT Chest recommended if Age 2-80 years, 30 pack-year currently smoking OR have quit w/in 15years.) does not qualify.   Additional Screening:  Hepatitis C Screening: does not qualify  Vision Screening: Recommended annual ophthalmology exams for early detection of glaucoma and other disorders of the eye. Is the patient up to date with their annual eye exam?  Yes  Who is the provider or what is the name of the office in which the patient attends annual eye exams? [facility] If pt is not established with a provider, would they like to be referred to a provider to establish care? No .   Dental Screening: Recommended annual dental exams for proper oral hygiene  Community Resource Referral / Chronic Care Management: CRR required this visit?  No   CCM required this visit?  No      Plan:     I have personally reviewed and noted the following in the patients chart:   Medical and social history Use of alcohol, tobacco or illicit drugs  Current medications and supplements including opioid prescriptions. Patient is not currently taking opioid prescriptions. Functional ability and status Nutritional status Physical activity Advanced directives List of other physicians Hospitalizations, surgeries, and ER visits in previous 12 months Vitals Screenings to include cognitive, depression, and falls Referrals and appointments  In addition, I have reviewed and discussed with patient certain preventive protocols, quality metrics, and best practice recommendations. A written personalized care  plan for preventive services as well as general preventive health recommendations were provided to patient.     Sandrea Hammond, LPN   00/92/3300   Nurse Notes: None

## 2021-08-30 NOTE — Patient Instructions (Signed)
Kyle [name] , Thank you for taking time to come for your Medicare Wellness Visit. I appreciate your ongoing commitment to your health goals. Please review the following plan we discussed and let me know if I can assist you in the future.   Screening recommendations/referrals: Colonoscopy: Done 08/26/2014 - no repeat required Recommended yearly ophthalmology/optometry visit for glaucoma screening and checkup Recommended yearly dental visit for hygiene and checkup  Vaccinations: Influenza vaccine: Due. Every fall Pneumococcal vaccine: Done 08/19/2012 & 04/07/2015 Tdap vaccine: Done 05/26/2011 - Repeat in 10 years *Due Shingles vaccine: Zostavax done 2013 - Due for Shingrix - check on this at next office visit   Covid-19: Done 07/2020 & 08/2020 - declines boosters  Advanced directives: Advance directive discussed with you today. Even though you declined this today, please call our office should you change your mind, and we can give you the proper paperwork for you to fill out.   Conditions/risks identified: Aim for 30 minutes of exercise or brisk walking each day, drink 6-8 glasses of water and eat lots of fruits and vegetables.   Next appointment: Follow up in one year for your annual wellness visit.   Preventive Care 19 Years and Older, Male  Preventive care refers to lifestyle choices and visits with your health care provider that can promote health and wellness. What does preventive care include? A yearly physical exam. This is also called an annual well check. Dental exams once or twice a year. Routine eye exams. Ask your health care provider how often you should have your eyes checked. Personal lifestyle choices, including: Daily care of your teeth and gums. Regular physical activity. Eating a healthy diet. Avoiding tobacco and drug use. Limiting alcohol use. Practicing safe sex. Taking low doses of aspirin every day. Taking vitamin and mineral supplements as recommended by your  health care provider. What happens during an annual well check? The services and screenings done by your health care provider during your annual well check will depend on your age, overall health, lifestyle risk factors, and family history of disease. Counseling  Your health care provider may ask you questions about your: Alcohol use. Tobacco use. Drug use. Emotional well-being. Home and relationship well-being. Sexual activity. Eating habits. History of falls. Memory and ability to understand (cognition). Work and work Statistician. Screening  You may have the following tests or measurements: Height, weight, and BMI. Blood pressure. Lipid and cholesterol levels. These may be checked every 5 years, or more frequently if you are over 51 years old. Skin check. Lung cancer screening. You may have this screening every year starting at age 43 if you have a 30-pack-year history of smoking and currently smoke or have quit within the past 15 years. Fecal occult blood test (FOBT) of the stool. You may have this test every year starting at age 45. Flexible sigmoidoscopy or colonoscopy. You may have a sigmoidoscopy every 5 years or a colonoscopy every 10 years starting at age 68. Prostate cancer screening. Recommendations will vary depending on your family history and other risks. Hepatitis C blood test. Hepatitis B blood test. Sexually transmitted disease (STD) testing. Diabetes screening. This is done by checking your blood sugar (glucose) after you have not eaten for a while (fasting). You may have this done every 1-3 years. Abdominal aortic aneurysm (AAA) screening. You may need this if you are a current or former smoker. Osteoporosis. You may be screened starting at age 7 if you are at high risk. Talk with your health care  provider about your test results, treatment options, and if necessary, the need for more tests. Vaccines  Your health care provider may recommend certain vaccines, such  as: Influenza vaccine. This is recommended every year. Tetanus, diphtheria, and acellular pertussis (Tdap, Td) vaccine. You may need a Td booster every 10 years. Zoster vaccine. You may need this after age 50. Pneumococcal 13-valent conjugate (PCV13) vaccine. One dose is recommended after age 56. Pneumococcal polysaccharide (PPSV23) vaccine. One dose is recommended after age 75. Talk to your health care provider about which screenings and vaccines you need and how often you need them. This information is not intended to replace advice given to you by your health care provider. Make sure you discuss any questions you have with your health care provider. Document Released: 09/24/2015 Document Revised: 05/17/2016 Document Reviewed: 06/29/2015 Elsevier Interactive Patient Education  2017 [employer] Prevention in the Home Falls can cause injuries. They can happen to people of all ages. There are many things you can do to make your home safe and to help prevent falls. What can I do on the outside of my home? Regularly fix the edges of walkways and driveways and fix any cracks. Remove anything that might make you trip as you walk through a door, such as a raised step or threshold. Trim any bushes or trees on the path to your home. Use bright outdoor lighting. Clear any walking paths of anything that might make someone trip, such as rocks or tools. Regularly check to see if handrails are loose or broken. Make sure that both sides of any steps have handrails. Any raised decks and porches should have guardrails on the edges. Have any leaves, snow, or ice cleared regularly. Use sand or salt on walking paths during winter. Clean up any spills in your garage right away. This includes oil or grease spills. What can I do in the bathroom? Use night lights. Install grab bars by the toilet and in the tub and shower. Do not use towel bars as grab bars. Use non-skid mats or decals in the tub or  shower. If you need to sit down in the shower, use a plastic, non-slip stool. Keep the floor dry. Clean up any water that spills on the floor as soon as it happens. Remove soap buildup in the tub or shower regularly. Attach bath mats securely with double-sided non-slip rug tape. Do not have throw rugs and other things on the floor that can make you trip. What can I do in the bedroom? Use night lights. Make sure that you have a light by your bed that is easy to reach. Do not use any sheets or blankets that are too big for your bed. They should not hang down onto the floor. Have a firm chair that has side arms. You can use this for support while you get dressed. Do not have throw rugs and other things on the floor that can make you trip. What can I do in the kitchen? Clean up any spills right away. Avoid walking on wet floors. Keep items that you use a lot in easy-to-reach places. If you need to reach something above you, use a strong step stool that has a grab bar. Keep electrical cords out of the way. Do not use floor polish or wax that makes floors slippery. If you must use wax, use non-skid floor wax. Do not have throw rugs and other things on the floor that can make you trip. What can I  do with my stairs? Do not leave any items on the stairs. Make sure that there are handrails on both sides of the stairs and use them. Fix handrails that are broken or loose. Make sure that handrails are as long as the stairways. Check any carpeting to make sure that it is firmly attached to the stairs. Fix any carpet that is loose or worn. Avoid having throw rugs at the top or bottom of the stairs. If you do have throw rugs, attach them to the floor with carpet tape. Make sure that you have a light switch at the top of the stairs and the bottom of the stairs. If you do not have them, ask someone to add them for you. What else can I do to help prevent falls? Wear shoes that: Do not have high heels. Have  rubber bottoms. Are comfortable and fit you well. Are closed at the toe. Do not wear sandals. If you use a stepladder: Make sure that it is fully opened. Do not climb a closed stepladder. Make sure that both sides of the stepladder are locked into place. Ask someone to hold it for you, if possible. Clearly mark and make sure that you can see: Any grab bars or handrails. First and last steps. Where the edge of each step is. Use tools that help you move around (mobility aids) if they are needed. These include: Canes. Walkers. Scooters. Crutches. Turn on the lights when you go into a dark area. Replace any light bulbs as soon as they burn out. Set up your furniture so you have a clear path. Avoid moving your furniture around. If any of your floors are uneven, fix them. If there are any pets around you, be aware of where they are. Review your medicines with your doctor. Some medicines can make you feel dizzy. This can increase your chance of falling. Ask your doctor what other things that you can do to help prevent falls. This information is not intended to replace advice given to you by your health care provider. Make sure you discuss any questions you have with your health care provider. Document Released: 06/24/2009 Document Revised: 02/03/2016 Document Reviewed: 10/02/2014 Elsevier Interactive Patient Education  2017 [employer].

## 2021-10-12 ENCOUNTER — Other Ambulatory Visit: Payer: Self-pay | Admitting: Family Medicine

## 2021-10-12 DIAGNOSIS — E782 Mixed hyperlipidemia: Secondary | ICD-10-CM

## 2021-11-09 ENCOUNTER — Encounter: Payer: Self-pay | Admitting: Family Medicine

## 2021-11-09 ENCOUNTER — Ambulatory Visit (INDEPENDENT_AMBULATORY_CARE_PROVIDER_SITE_OTHER): Payer: Medicare Other | Admitting: Family Medicine

## 2021-11-09 VITALS — BP 157/69 | HR 58 | Ht 67.0 in | Wt 153.0 lb

## 2021-11-09 DIAGNOSIS — E782 Mixed hyperlipidemia: Secondary | ICD-10-CM | POA: Diagnosis not present

## 2021-11-09 DIAGNOSIS — I480 Paroxysmal atrial fibrillation: Secondary | ICD-10-CM | POA: Diagnosis not present

## 2021-11-09 DIAGNOSIS — Z23 Encounter for immunization: Secondary | ICD-10-CM

## 2021-11-09 MED ORDER — ATORVASTATIN CALCIUM 20 MG PO TABS: 20.0000 mg | ORAL_TABLET | Freq: Every day | ORAL | 3 refills | Status: DC

## 2021-11-09 NOTE — Progress Notes (Signed)
? ?BP (!) 157/69   Pulse (!) 58   Ht 5' 7"  (1.702 m)   Wt 153 lb (69.4 kg)   SpO2 98%   BMI 23.96 kg/m?   ? ?Subjective:  ? ?Patient ID: Kyle [name], male    DOB: 05-02-1935, 87 y.o.   MRN: [id number] ? ?HPI: ?Kyle [name] is a 87 y.o. male presenting on 11/09/2021 for Medical Management of Chronic Issues and Hyperlipidemia ? ? ?HPI ?Hyperlipidemia ?Patient is coming in for recheck of his hyperlipidemia. The patient is currently taking fish oil and atorvastatin. They deny any issues with myalgias or history of liver damage from it. They deny any focal numbness or weakness or chest pain.  ? ?A-fib ?Patient has  A-fib.  He does take aspirin but is not taking any stronger blood thinner.  This was a decision between him and his cardiologist that he made at that time.  He knows about risks. ? ?Relevant past medical, surgical, family and social history reviewed and updated as indicated. Interim medical history since our last visit reviewed. ?Allergies and medications reviewed and updated. ? ?Review of Systems  ?Constitutional:  Negative for chills and fever.  ?Eyes:  Negative for visual disturbance.  ?Respiratory:  Negative for shortness of breath and wheezing.   ?Cardiovascular:  Negative for chest pain and leg swelling.  ?Musculoskeletal:  Positive for arthralgias (Has some elbow soreness after surgery from pins.  He says he was doing good for a while but then all of a sudden started bothering him more recently.). Negative for back pain and gait problem.  ?Skin:  Negative for rash.  ?Neurological:  Negative for dizziness, weakness and light-headedness.  ?All other systems reviewed and are negative. ? ?Per HPI unless specifically indicated above ? ? ?Allergies as of 11/09/2021   ? ?   Reactions  ? Penicillins   ? REACTION: dizziness/syncope  ? ?  ? ?  ?Medication List  ?  ? ?  ? Accurate as of November 09, 2021 11:53 AM. If you have any questions, ask your nurse or doctor.  ?  ?  ? ?  ? ?aspirin 81 MG tablet ?Take 81 mg by  mouth daily. ?  ?atorvastatin 20 MG tablet ?Commonly known as: LIPITOR ?Take 1 tablet (20 mg total) by mouth daily. ?What changed: additional instructions ?Changed by: Worthy Rancher, MD ?  ?fish oil-omega-3 fatty acids 1000 MG capsule ?Take 1 capsule by mouth daily. ?  ?GOODYS EXTRA STRENGTH PO ?Take by mouth as needed. Arthritis ?  ? ?  ? ? ? ?Objective:  ? ?BP (!) 157/69   Pulse (!) 58   Ht 5' 7"  (1.702 m)   Wt 153 lb (69.4 kg)   SpO2 98%   BMI 23.96 kg/m?   ?Wt Readings from Last 3 Encounters:  ?11/09/21 153 lb (69.4 kg)  ?08/30/21 154 lb (69.9 kg)  ?08/16/20 155 lb (70.3 kg)  ?  ?Physical Exam ?Vitals and nursing note reviewed.  ?Constitutional:   ?   General: He is not in acute distress. ?   Appearance: He is well-developed. He is not diaphoretic.  ?Eyes:  ?   General: No scleral icterus. ?   Conjunctiva/sclera: Conjunctivae normal.  ?Neck:  ?   Thyroid: No thyromegaly.  ?Cardiovascular:  ?   Rate and Rhythm: Normal rate. Rhythm irregular.  ?   Heart sounds: Normal heart sounds. No murmur heard. ?Pulmonary:  ?   Effort: Pulmonary effort is normal. No respiratory distress.  ?  Breath sounds: Normal breath sounds. No wheezing.  ?Musculoskeletal:     ?   General: Normal range of motion.  ?   Cervical back: Neck supple.  ?Lymphadenopathy:  ?   Cervical: No cervical adenopathy.  ?Skin: ?   General: Skin is warm and dry.  ?   Findings: No rash.  ?Neurological:  ?   Mental Status: He is alert and oriented to person, place, and time.  ?   Coordination: Coordination normal.  ?Psychiatric:     ?   Behavior: Behavior normal.  ? ? ? ? ?Assessment & Plan:  ? ?Problem List Items Addressed This Visit   ? ?  ? Cardiovascular and Mediastinum  ? Atrial fibrillation ([place]) - Primary  ? Relevant Medications  ? atorvastatin (LIPITOR) 20 MG tablet  ? Other Relevant Orders  ? CBC with Differential/Platelet  ?  ? Other  ? Hyperlipidemia  ? Relevant Medications  ? atorvastatin (LIPITOR) 20 MG tablet  ? Other Relevant Orders   ? CMP14+EGFR  ? Lipid panel  ?  ?Tetanus and shingles today.  Recheck cholesterol today.  Seems to be doing well.  No change medication ? ?He will go back to his orthopedic and recheck upon his elbow ?Follow up plan: ?Return in about 6 months (around 05/12/2022), or if symptoms worsen or fail to improve, for Physical and recheck cholesterol and second shingles shot. ? ?Counseling provided for all of the vaccine components ?Orders Placed This Encounter  ?Procedures  ? CBC with Differential/Platelet  ? CMP14+EGFR  ? Lipid panel  ? ? ?Caryl Pina, MD ?[place] ?11/09/2021, 11:53 AM ? ? ? ? ?

## 2021-11-10 LAB — CMP14+EGFR
ALT: 8 IU/L (ref 0–44)
AST: 12 IU/L (ref 0–40)
Albumin/Globulin Ratio: 2.2 (ref 1.2–2.2)
Albumin: 4.6 g/dL (ref 3.6–4.6)
Alkaline Phosphatase: 67 IU/L (ref 44–121)
BUN/Creatinine Ratio: 12 (ref 10–24)
BUN: 9 mg/dL (ref 8–27)
Bilirubin Total: 0.5 mg/dL (ref 0.0–1.2)
CO2: 27 mmol/L (ref 20–29)
Calcium: 9.4 mg/dL (ref 8.6–10.2)
Chloride: 101 mmol/L (ref 96–106)
Creatinine, Ser: 0.75 mg/dL — ABNORMAL LOW (ref 0.76–1.27)
Globulin, Total: 2.1 g/dL (ref 1.5–4.5)
Glucose: 103 mg/dL — ABNORMAL HIGH (ref 70–99)
Potassium: 4.9 mmol/L (ref 3.5–5.2)
Sodium: 138 mmol/L (ref 134–144)
Total Protein: 6.7 g/dL (ref 6.0–8.5)
eGFR: 89 mL/min/{1.73_m2} (ref >59)

## 2021-11-10 LAB — CBC WITH DIFFERENTIAL/PLATELET
Basophils Absolute: 0 10*3/uL (ref 0.0–0.2)
Basos: 1 %
EOS (ABSOLUTE): 0.1 10*3/uL (ref 0.0–0.4)
Eos: 1 %
Hematocrit: 40.3 % (ref 37.5–51.0)
Hemoglobin: 13.4 g/dL (ref 13.0–17.7)
Immature Grans (Abs): 0 10*3/uL (ref 0.0–0.1)
Immature Granulocytes: 0 %
Lymphocytes Absolute: 2.4 10*3/uL (ref 0.7–3.1)
Lymphs: 31 %
MCH: 29.5 pg (ref 26.6–33.0)
MCHC: 33.3 g/dL (ref 31.5–35.7)
MCV: 89 fL (ref 79–97)
Monocytes Absolute: 0.4 10*3/uL (ref 0.1–0.9)
Monocytes: 6 %
Neutrophils Absolute: 4.6 10*3/uL (ref 1.4–7.0)
Neutrophils: 61 %
Platelets: 232 10*3/uL (ref 150–450)
RBC: 4.54 x10E6/uL (ref 4.14–5.80)
RDW: 12.8 % (ref 11.6–15.4)
WBC: 7.6 10*3/uL (ref 3.4–10.8)

## 2021-11-10 LAB — LIPID PANEL
Chol/HDL Ratio: 4.8 ratio (ref 0.0–5.0)
Cholesterol, Total: 217 mg/dL — ABNORMAL HIGH (ref 100–199)
HDL: 45 mg/dL (ref >39)
LDL Chol Calc (NIH): 152 mg/dL — ABNORMAL HIGH (ref 0–99)
Triglycerides: 109 mg/dL (ref 0–149)
VLDL Cholesterol Cal: 20 mg/dL (ref 5–40)

## 2021-11-16 DIAGNOSIS — M7021 Olecranon bursitis, right elbow: Secondary | ICD-10-CM | POA: Diagnosis not present

## 2021-11-24 ENCOUNTER — Telehealth: Payer: Self-pay

## 2021-11-24 DIAGNOSIS — T84398A Other mechanical complication of other bone devices, implants and grafts, initial encounter: Secondary | ICD-10-CM | POA: Diagnosis not present

## 2021-11-24 DIAGNOSIS — T84498A Other mechanical complication of other internal orthopedic devices, implants and grafts, initial encounter: Secondary | ICD-10-CM | POA: Diagnosis not present

## 2021-11-24 NOTE — Telephone Encounter (Signed)
Patient aware and verbalized understanding. °

## 2021-11-24 NOTE — Telephone Encounter (Signed)
Pt dropped off blood pressure readings on 3/15. Per Dettinger his blood pressure looks good. ? ?Called pts number and spoke to daughter. She is not on pts [employer]. Daughter will have pt call when he gets back home. ?

## 2021-12-05 DIAGNOSIS — T84498D Other mechanical complication of other internal orthopedic devices, implants and grafts, subsequent encounter: Secondary | ICD-10-CM | POA: Diagnosis not present

## 2022-03-06 IMAGING — DX DG WRIST COMPLETE 3+V*R*
4 series · 4 of 4 positions shown · non-contrast
Comparison: None.

CLINICAL DATA: Fell 4 days ago. Right wrist pain.

EXAM:
RIGHT WRIST - COMPLETE 3+ VIEW

[wrist pa]
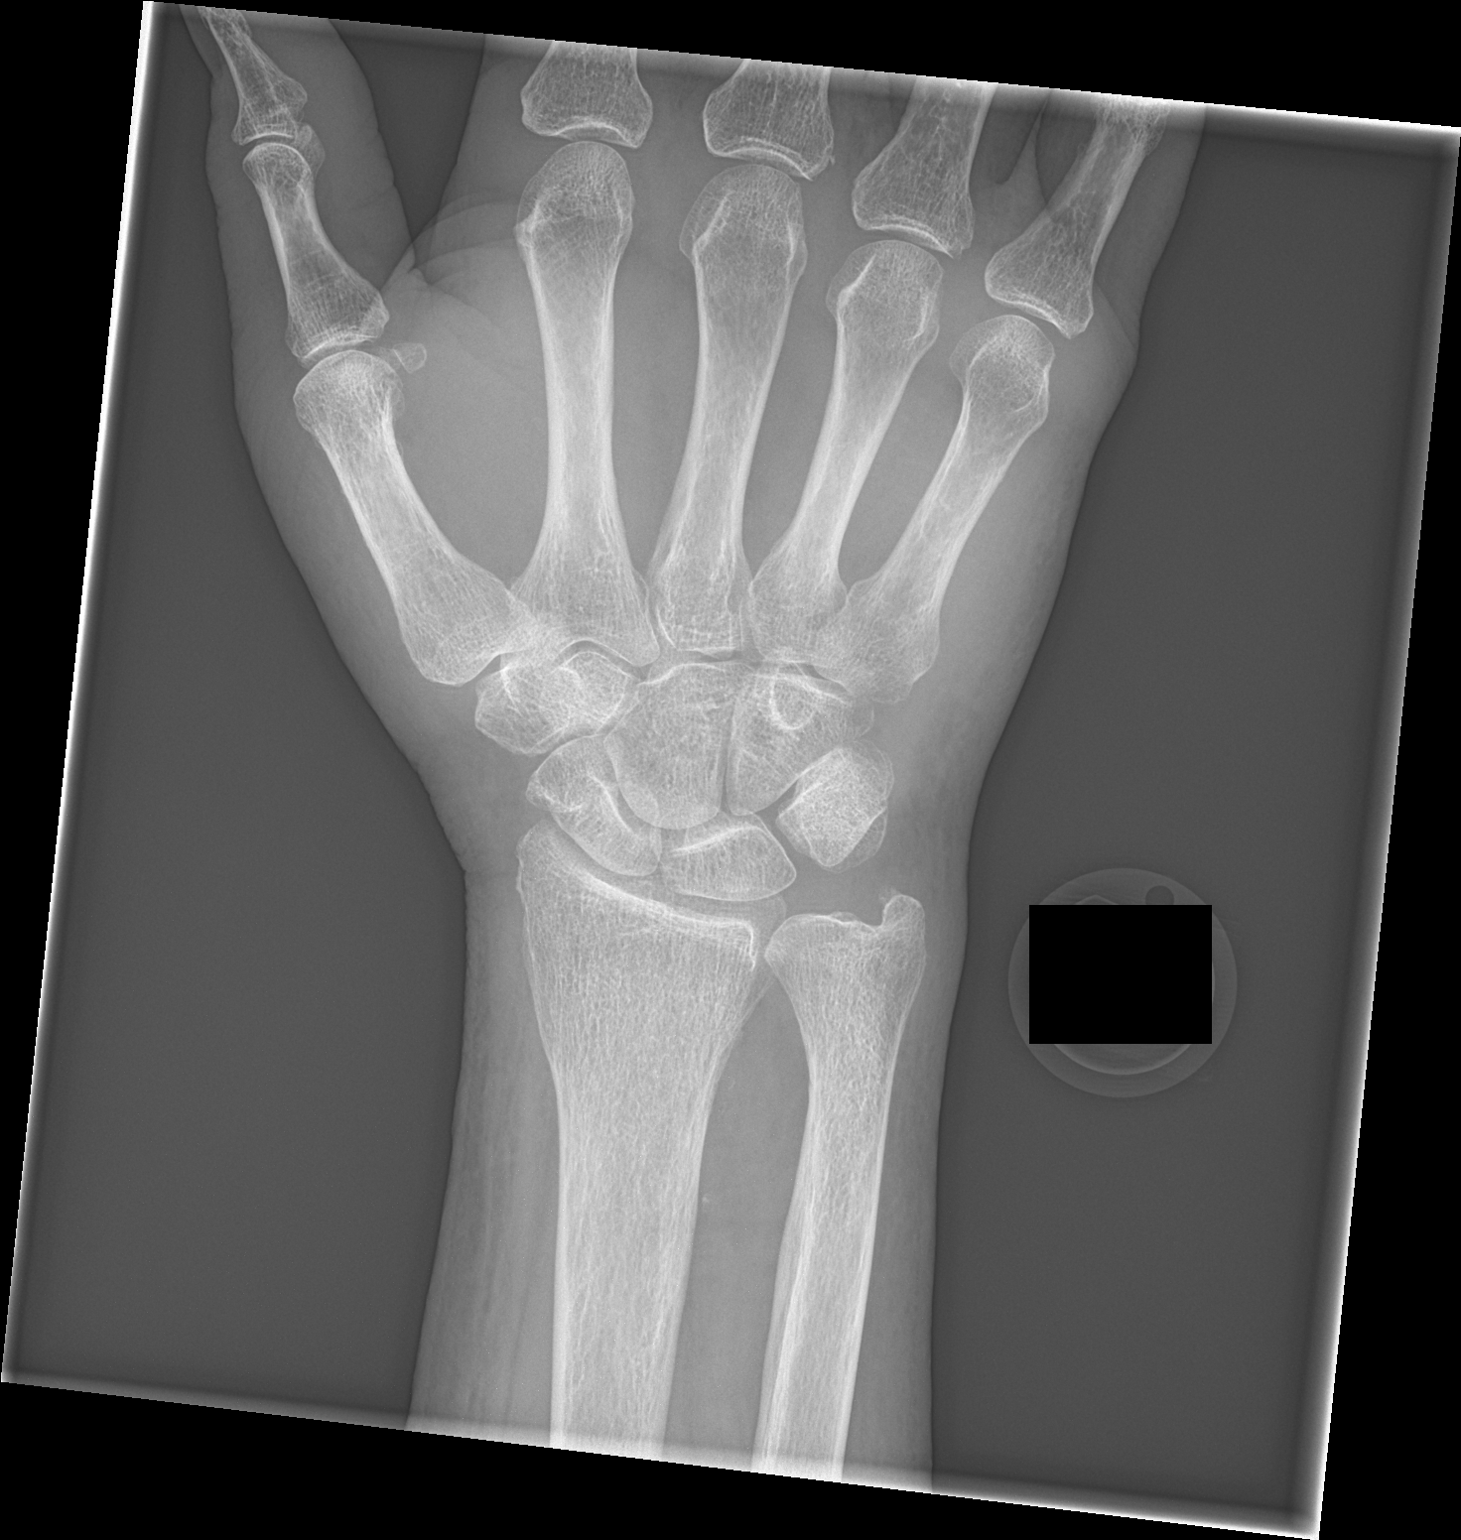

[wrist navicular]
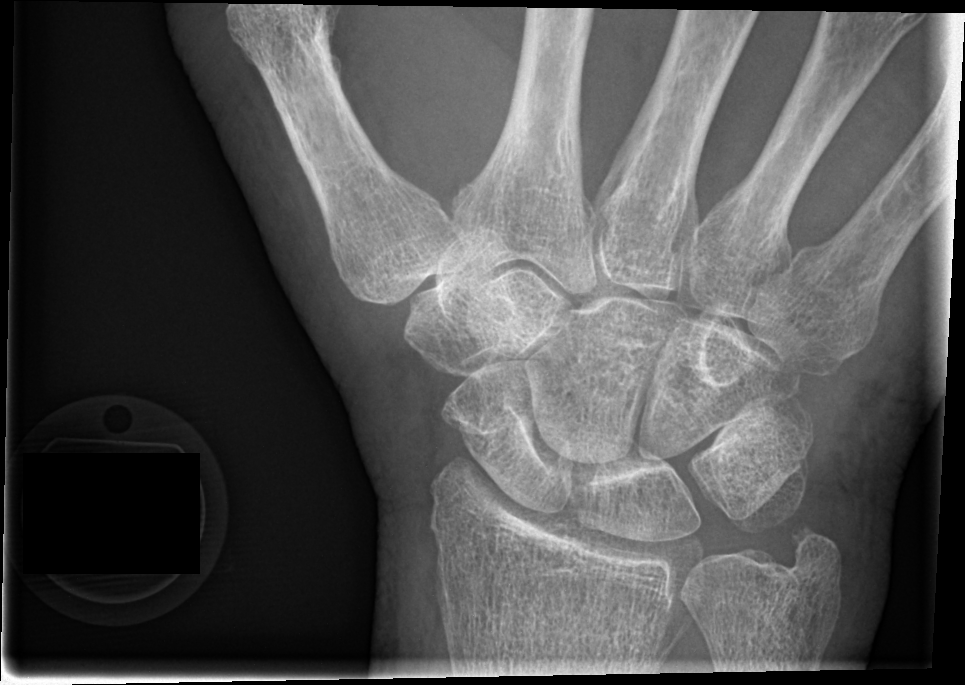

[wrist obl]
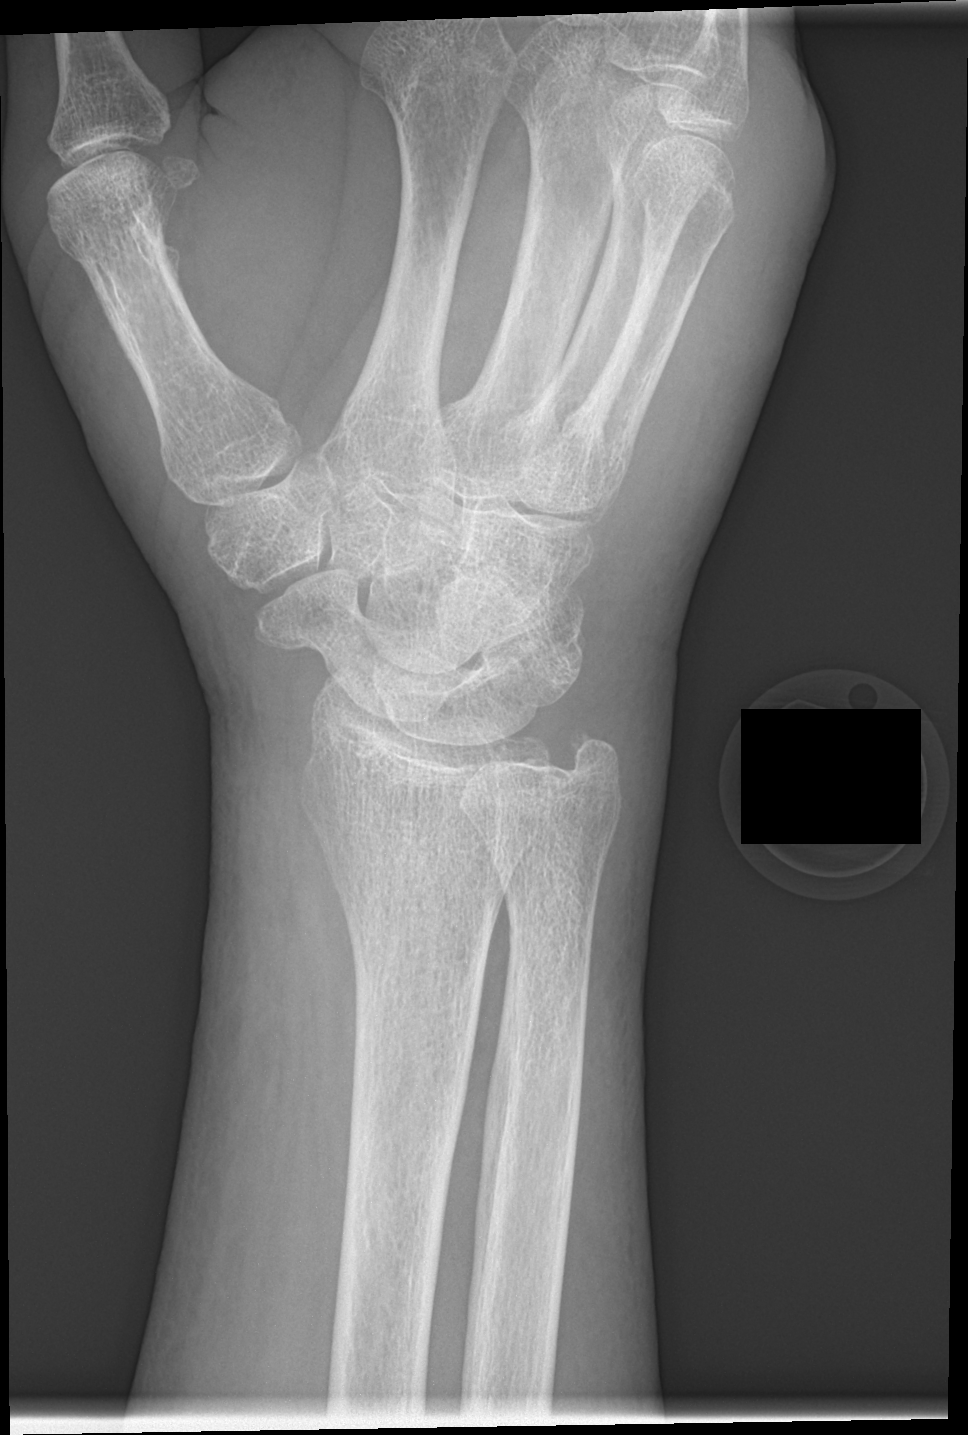

[wrist lat]
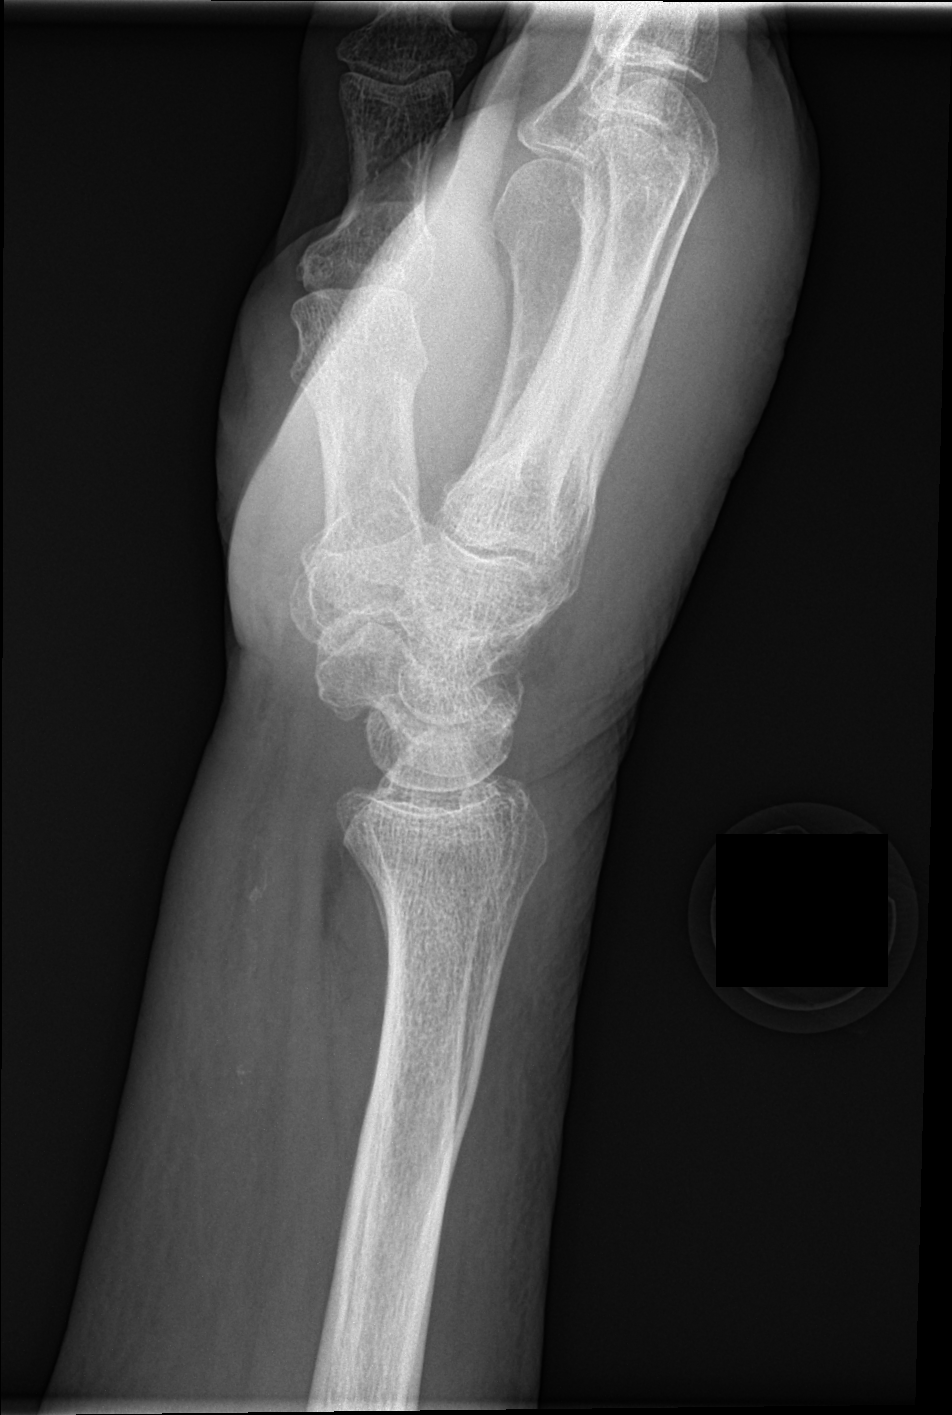

[4 of 4 positions shown; findings below may reference images not displayed]

FINDINGS: The joint spaces are maintained. Mild age related degenerative
changes but no acute wrist or proximal hand fracture is identified.
IMPRESSION: No acute bony findings.

## 2022-05-12 ENCOUNTER — Ambulatory Visit: Payer: Medicare Other | Admitting: Family Medicine

## 2022-06-01 ENCOUNTER — Ambulatory Visit (INDEPENDENT_AMBULATORY_CARE_PROVIDER_SITE_OTHER): Payer: Medicare Other | Admitting: Family Medicine

## 2022-06-01 ENCOUNTER — Encounter: Payer: Self-pay | Admitting: Family Medicine

## 2022-06-01 VITALS — BP 165/70 | HR 54 | Temp 97.6°F | Ht 67.0 in | Wt 156.0 lb

## 2022-06-01 DIAGNOSIS — I480 Paroxysmal atrial fibrillation: Secondary | ICD-10-CM

## 2022-06-01 DIAGNOSIS — E782 Mixed hyperlipidemia: Secondary | ICD-10-CM | POA: Diagnosis not present

## 2022-06-01 DIAGNOSIS — Z23 Encounter for immunization: Secondary | ICD-10-CM

## 2022-06-01 NOTE — Progress Notes (Signed)
BP (!) 165/70   Pulse (!) 54   Temp 97.6 F (36.4 C)   Ht 5' 7" (1.702 m)   Wt 156 lb (70.8 kg)   SpO2 98%   BMI 24.43 kg/m    Subjective:   Patient ID: Kyle [name], male    DOB: 1935/08/21, 87 y.o.   MRN: [id number]  HPI: Kyle [name] is a 87 y.o. male presenting on 06/01/2022 for Medical Management of Chronic Issues, Hyperlipidemia, and Hypertension   HPI Hyperlipidemia Patient is coming in for recheck of his hyperlipidemia. The patient is currently taking atorvastatin and fish oils. They deny any issues with myalgias or history of liver damage from it. They deny any focal numbness or weakness or chest pain.   Hypertension and A-fib Patient is currently on no medicine currently, and their blood pressure today is 165/70 but at home he is currently in the 140s. Patient has occasional dizziness when he gets up quickly and is not too frequent but today in the office is a little off balance.. Patient denies headaches, blurred vision, chest pains, shortness of breath, or weakness. Denies any side effects from medication and is content with current medication.   Relevant past medical, surgical, family and social history reviewed and updated as indicated. Interim medical history since our last visit reviewed. Allergies and medications reviewed and updated.  Review of Systems  Constitutional:  Negative for chills and fever.  Eyes:  Negative for visual disturbance.  Respiratory:  Negative for shortness of breath and wheezing.   Cardiovascular:  Negative for chest pain and leg swelling.  Musculoskeletal:  Negative for back pain and gait problem.  Skin:  Negative for rash.  Neurological:  Negative for dizziness, weakness and numbness.  All other systems reviewed and are negative.   Per HPI unless specifically indicated above   Allergies as of 06/01/2022       Reactions   Penicillins    REACTION: dizziness/syncope        Medication List        Accurate as of June 01, 2022  8:47 AM. If you have any questions, ask your nurse or doctor.          aspirin 81 MG tablet Take 81 mg by mouth daily.   atorvastatin 20 MG tablet Commonly known as: LIPITOR Take 1 tablet (20 mg total) by mouth daily.   fish oil-omega-3 fatty acids 1000 MG capsule Take 1 capsule by mouth daily.   GOODYS EXTRA STRENGTH PO Take by mouth as needed. Arthritis   OSTEO BI-FLEX REGULAR STRENGTH PO Take 1 tablet by mouth daily.         Objective:   BP (!) 165/70   Pulse (!) 54   Temp 97.6 F (36.4 C)   Ht 5' 7" (1.702 m)   Wt 156 lb (70.8 kg)   SpO2 98%   BMI 24.43 kg/m   Wt Readings from Last 3 Encounters:  06/01/22 156 lb (70.8 kg)  11/09/21 153 lb (69.4 kg)  08/30/21 154 lb (69.9 kg)    Physical Exam Vitals and nursing note reviewed.  Constitutional:      General: He is not in acute distress.    Appearance: He is well-developed. He is not diaphoretic.  Eyes:     General: No scleral icterus.    Conjunctiva/sclera: Conjunctivae normal.  Neck:     Thyroid: No thyromegaly.  Cardiovascular:     Rate and Rhythm: Normal rate and regular rhythm.  Heart sounds: Normal heart sounds. No murmur heard. Pulmonary:     Effort: Pulmonary effort is normal. No respiratory distress.     Breath sounds: Normal breath sounds. No wheezing.  Musculoskeletal:        General: No swelling. Normal range of motion.     Cervical back: Neck supple.  Lymphadenopathy:     Cervical: No cervical adenopathy.  Skin:    General: Skin is warm and dry.     Findings: No rash.  Neurological:     Mental Status: He is alert and oriented to person, place, and time.     Coordination: Coordination normal.  Psychiatric:        Behavior: Behavior normal.     CHA2DS2-VASc Score =    The patient's score is based upon:        ASSESSMENT AND PLAN: Paroxysmal Atrial Fibrillation (ICD10:  I48.0) The patient's CHA2DS2-VASc score is  , indicating a  % annual risk of stroke.  0.4       Signed,  Fransisca Kaufmann Jeanna Giuffre, MD    06/01/2022 8:47 AM     Assessment & Plan:   Problem List Items Addressed This Visit       Cardiovascular and Mediastinum   Atrial fibrillation ([place])   Relevant Orders   CBC with Differential/Platelet     Other   Hyperlipidemia - Primary   Relevant Orders   CBC with Differential/Platelet   CMP14+EGFR   Lipid panel   Other Visit Diagnoses     Need for shingles vaccine       Relevant Orders   Zoster Recombinant (Shingrix )       Concerned slightly about balance but patient denies that.  He declines any help and denies any falls.  He says told him because he has not eaten this morning that he is a little off balance  We will check blood work today. Follow up plan: Return in about 6 months (around 11/30/2022), or if symptoms worsen or fail to improve, for A-fib and hyperlipidemia.  Counseling provided for all of the vaccine components Orders Placed This Encounter  Procedures   Zoster Recombinant (Shingrix )   CBC with Differential/Platelet   CMP14+EGFR   Lipid panel    Caryl Pina, MD [facility] 06/01/2022, 8:47 AM

## 2022-06-02 LAB — CMP14+EGFR
ALT: 8 IU/L (ref 0–44)
AST: 14 IU/L (ref 0–40)
Albumin/Globulin Ratio: 1.9 (ref 1.2–2.2)
Albumin: 4.4 g/dL (ref 3.7–4.7)
Alkaline Phosphatase: 49 IU/L (ref 44–121)
BUN/Creatinine Ratio: 10 (ref 10–24)
BUN: 7 mg/dL — ABNORMAL LOW (ref 8–27)
Bilirubin Total: 0.5 mg/dL (ref 0.0–1.2)
CO2: 24 mmol/L (ref 20–29)
Calcium: 9.4 mg/dL (ref 8.6–10.2)
Chloride: 100 mmol/L (ref 96–106)
Creatinine, Ser: 0.72 mg/dL — ABNORMAL LOW (ref 0.76–1.27)
Globulin, Total: 2.3 g/dL (ref 1.5–4.5)
Glucose: 107 mg/dL — ABNORMAL HIGH (ref 70–99)
Potassium: 5.3 mmol/L — ABNORMAL HIGH (ref 3.5–5.2)
Sodium: 138 mmol/L (ref 134–144)
Total Protein: 6.7 g/dL (ref 6.0–8.5)
eGFR: 90 mL/min/{1.73_m2} (ref >59)

## 2022-06-02 LAB — CBC WITH DIFFERENTIAL/PLATELET
Basophils Absolute: 0 10*3/uL (ref 0.0–0.2)
Basos: 1 %
EOS (ABSOLUTE): 0.1 10*3/uL (ref 0.0–0.4)
Eos: 1 %
Hematocrit: 40.5 % (ref 37.5–51.0)
Hemoglobin: 13.9 g/dL (ref 13.0–17.7)
Immature Grans (Abs): 0 10*3/uL (ref 0.0–0.1)
Immature Granulocytes: 0 %
Lymphocytes Absolute: 2.2 10*3/uL (ref 0.7–3.1)
Lymphs: 27 %
MCH: 30.8 pg (ref 26.6–33.0)
MCHC: 34.3 g/dL (ref 31.5–35.7)
MCV: 90 fL (ref 79–97)
Monocytes Absolute: 0.5 10*3/uL (ref 0.1–0.9)
Monocytes: 6 %
Neutrophils Absolute: 5.3 10*3/uL (ref 1.4–7.0)
Neutrophils: 65 %
Platelets: 266 10*3/uL (ref 150–450)
RBC: 4.51 x10E6/uL (ref 4.14–5.80)
RDW: 12.5 % (ref 11.6–15.4)
WBC: 8.2 10*3/uL (ref 3.4–10.8)

## 2022-06-02 LAB — LIPID PANEL
Chol/HDL Ratio: 3.9 ratio (ref 0.0–5.0)
Cholesterol, Total: 193 mg/dL (ref 100–199)
HDL: 49 mg/dL (ref >39)
LDL Chol Calc (NIH): 126 mg/dL — ABNORMAL HIGH (ref 0–99)
Triglycerides: 102 mg/dL (ref 0–149)
VLDL Cholesterol Cal: 18 mg/dL (ref 5–40)

## 2022-06-07 NOTE — Progress Notes (Signed)
Patient returning call. Please call back

## 2022-07-03 DIAGNOSIS — L570 Actinic keratosis: Secondary | ICD-10-CM | POA: Diagnosis not present

## 2022-07-03 DIAGNOSIS — X32XXXA Exposure to sunlight, initial encounter: Secondary | ICD-10-CM | POA: Diagnosis not present

## 2022-07-03 DIAGNOSIS — C44319 Basal cell carcinoma of skin of other parts of face: Secondary | ICD-10-CM | POA: Diagnosis not present

## 2022-08-21 DIAGNOSIS — X32XXXD Exposure to sunlight, subsequent encounter: Secondary | ICD-10-CM | POA: Diagnosis not present

## 2022-08-21 DIAGNOSIS — C44319 Basal cell carcinoma of skin of other parts of face: Secondary | ICD-10-CM | POA: Diagnosis not present

## 2022-08-21 DIAGNOSIS — Z85828 Personal history of other malignant neoplasm of skin: Secondary | ICD-10-CM | POA: Diagnosis not present

## 2022-08-21 DIAGNOSIS — Z08 Encounter for follow-up examination after completed treatment for malignant neoplasm: Secondary | ICD-10-CM | POA: Diagnosis not present

## 2022-08-21 DIAGNOSIS — L570 Actinic keratosis: Secondary | ICD-10-CM | POA: Diagnosis not present

## 2022-08-31 ENCOUNTER — Ambulatory Visit (INDEPENDENT_AMBULATORY_CARE_PROVIDER_SITE_OTHER): Payer: Medicare Other

## 2022-08-31 VITALS — Ht 67.0 in | Wt 150.0 lb

## 2022-08-31 DIAGNOSIS — Z0000 Encounter for general adult medical examination without abnormal findings: Secondary | ICD-10-CM | POA: Diagnosis not present

## 2022-08-31 NOTE — Patient Instructions (Signed)
Mr. Emma , Thank you for taking time to come for your Medicare Wellness Visit. I appreciate your ongoing commitment to your health goals. Please review the following plan we discussed and let me know if I can assist you in the future.   These are the goals we discussed:  Goals      Patient Stated     Stay healthy and active        This is a list of the screening recommended for you and due dates:  Health Maintenance  Topic Date Due   COVID-19 Vaccine (3 - Pfizer risk series) 09/08/2020   Flu Shot  12/10/2022*   Medicare Annual Wellness Visit  09/01/2023   DTaP/Tdap/Td vaccine (4 - Td or Tdap) 11/10/2031   Pneumonia Vaccine  Completed   Zoster (Shingles) Vaccine  Completed   HPV Vaccine  Aged Out  *Topic was postponed. The date shown is not the original due date.    Advanced directives: Advance directive discussed with you today. I have provided a copy for you to complete at home and have notarized. Once this is complete please bring a copy in to our office so we can scan it into your chart.   Conditions/risks identified: Aim for 30 minutes of exercise or brisk walking, 6-8 glasses of water, and 5 servings of fruits and vegetables each day.   Next appointment: Follow up in one year for your annual wellness visit.   Preventive Care 30 Years and Older, Male  Preventive care refers to lifestyle choices and visits with your health care provider that can promote health and wellness. What does preventive care include? A yearly physical exam. This is also called an annual well check. Dental exams once or twice a year. Routine eye exams. Ask your health care provider how often you should have your eyes checked. Personal lifestyle choices, including: Daily care of your teeth and gums. Regular physical activity. Eating a healthy diet. Avoiding tobacco and drug use. Limiting alcohol use. Practicing safe sex. Taking low doses of aspirin every day. Taking vitamin and mineral  supplements as recommended by your health care provider. What happens during an annual well check? The services and screenings done by your health care provider during your annual well check will depend on your age, overall health, lifestyle risk factors, and family history of disease. Counseling  Your health care provider may ask you questions about your: Alcohol use. Tobacco use. Drug use. Emotional well-being. Home and relationship well-being. Sexual activity. Eating habits. History of falls. Memory and ability to understand (cognition). Work and work Statistician. Screening  You may have the following tests or measurements: Height, weight, and BMI. Blood pressure. Lipid and cholesterol levels. These may be checked every 5 years, or more frequently if you are over 94 years old. Skin check. Lung cancer screening. You may have this screening every year starting at age 79 if you have a 30-pack-year history of smoking and currently smoke or have quit within the past 15 years. Fecal occult blood test (FOBT) of the stool. You may have this test every year starting at age 61. Flexible sigmoidoscopy or colonoscopy. You may have a sigmoidoscopy every 5 years or a colonoscopy every 10 years starting at age 47. Prostate cancer screening. Recommendations will vary depending on your family history and other risks. Hepatitis C blood test. Hepatitis B blood test. Sexually transmitted disease (STD) testing. Diabetes screening. This is done by checking your blood sugar (glucose) after you have not eaten for  a while (fasting). You may have this done every 1-3 years. Abdominal aortic aneurysm (AAA) screening. You may need this if you are a current or former smoker. Osteoporosis. You may be screened starting at age 11 if you are at high risk. Talk with your health care provider about your test results, treatment options, and if necessary, the need for more tests. Vaccines  Your health care provider  may recommend certain vaccines, such as: Influenza vaccine. This is recommended every year. Tetanus, diphtheria, and acellular pertussis (Tdap, Td) vaccine. You may need a Td booster every 10 years. Zoster vaccine. You may need this after age 74. Pneumococcal 13-valent conjugate (PCV13) vaccine. One dose is recommended after age 39. Pneumococcal polysaccharide (PPSV23) vaccine. One dose is recommended after age 63. Talk to your health care provider about which screenings and vaccines you need and how often you need them. This information is not intended to replace advice given to you by your health care provider. Make sure you discuss any questions you have with your health care provider. Document Released: 09/24/2015 Document Revised: 05/17/2016 Document Reviewed: 06/29/2015 Elsevier Interactive Patient Education  2017 [employer] Prevention in the Home Falls can cause injuries. They can happen to people of all ages. There are many things you can do to make your home safe and to help prevent falls. What can I do on the outside of my home? Regularly fix the edges of walkways and driveways and fix any cracks. Remove anything that might make you trip as you walk through a door, such as a raised step or threshold. Trim any bushes or trees on the path to your home. Use bright outdoor lighting. Clear any walking paths of anything that might make someone trip, such as rocks or tools. Regularly check to see if handrails are loose or broken. Make sure that both sides of any steps have handrails. Any raised decks and porches should have guardrails on the edges. Have any leaves, snow, or ice cleared regularly. Use sand or salt on walking paths during winter. Clean up any spills in your garage right away. This includes oil or grease spills. What can I do in the bathroom? Use night lights. Install grab bars by the toilet and in the tub and shower. Do not use towel bars as grab bars. Use  non-skid mats or decals in the tub or shower. If you need to sit down in the shower, use a plastic, non-slip stool. Keep the floor dry. Clean up any water that spills on the floor as soon as it happens. Remove soap buildup in the tub or shower regularly. Attach bath mats securely with double-sided non-slip rug tape. Do not have throw rugs and other things on the floor that can make you trip. What can I do in the bedroom? Use night lights. Make sure that you have a light by your bed that is easy to reach. Do not use any sheets or blankets that are too big for your bed. They should not hang down onto the floor. Have a firm chair that has side arms. You can use this for support while you get dressed. Do not have throw rugs and other things on the floor that can make you trip. What can I do in the kitchen? Clean up any spills right away. Avoid walking on wet floors. Keep items that you use a lot in easy-to-reach places. If you need to reach something above you, use a strong step stool that has a  grab bar. Keep electrical cords out of the way. Do not use floor polish or wax that makes floors slippery. If you must use wax, use non-skid floor wax. Do not have throw rugs and other things on the floor that can make you trip. What can I do with my stairs? Do not leave any items on the stairs. Make sure that there are handrails on both sides of the stairs and use them. Fix handrails that are broken or loose. Make sure that handrails are as long as the stairways. Check any carpeting to make sure that it is firmly attached to the stairs. Fix any carpet that is loose or worn. Avoid having throw rugs at the top or bottom of the stairs. If you do have throw rugs, attach them to the floor with carpet tape. Make sure that you have a light switch at the top of the stairs and the bottom of the stairs. If you do not have them, ask someone to add them for you. What else can I do to help prevent falls? Wear  shoes that: Do not have high heels. Have rubber bottoms. Are comfortable and fit you well. Are closed at the toe. Do not wear sandals. If you use a stepladder: Make sure that it is fully opened. Do not climb a closed stepladder. Make sure that both sides of the stepladder are locked into place. Ask someone to hold it for you, if possible. Clearly mark and make sure that you can see: Any grab bars or handrails. First and last steps. Where the edge of each step is. Use tools that help you move around (mobility aids) if they are needed. These include: Canes. Walkers. Scooters. Crutches. Turn on the lights when you go into a dark area. Replace any light bulbs as soon as they burn out. Set up your furniture so you have a clear path. Avoid moving your furniture around. If any of your floors are uneven, fix them. If there are any pets around you, be aware of where they are. Review your medicines with your doctor. Some medicines can make you feel dizzy. This can increase your chance of falling. Ask your doctor what other things that you can do to help prevent falls. This information is not intended to replace advice given to you by your health care provider. Make sure you discuss any questions you have with your health care provider. Document Released: 06/24/2009 Document Revised: 02/03/2016 Document Reviewed: 10/02/2014 Elsevier Interactive Patient Education  2017 [employer].

## 2022-08-31 NOTE — Progress Notes (Signed)
Subjective:   Kyle [name] is a 87 y.o. male who presents for Medicare Annual/Subsequent preventive examination. I connected with  Luqman Perrelli Roll on 08/31/22 by a audio enabled telemedicine application and verified that I am speaking with the correct person using two identifiers.  Patient Location: Home  Provider Location: Home Office  I discussed the limitations of evaluation and management by telemedicine. The patient expressed understanding and agreed to proceed.  Review of Systems     Cardiac Risk Factors include: advanced age (>102mn, >>57women);dyslipidemia;male gender     Objective:    Today's Vitals   08/31/22 1449  Weight: 150 lb (68 kg)  Height: '5\' 7"'$  (1.702 m)   Body mass index is 23.49 kg/m.     08/31/2022    2:55 PM 08/30/2021    3:39 PM 08/12/2014   12:44 PM  Advanced Directives  Does Patient Have a Medical Advance Directive? No No No  Would patient like information on creating a medical advance directive? No - Patient declined No - Patient declined     Current Medications (verified) Outpatient Encounter Medications as of 08/31/2022  Medication Sig   aspirin 81 MG tablet Take 81 mg by mouth daily.     atorvastatin (LIPITOR) 20 MG tablet Take 1 tablet (20 mg total) by mouth daily.   fish oil-omega-3 fatty acids 1000 MG capsule Take 1 capsule by mouth daily.     Glucosamine-Chondroitin (OSTEO BI-FLEX REGULAR STRENGTH PO) Take 1 tablet by mouth daily.   Aspirin-Acetaminophen-Caffeine (GOODYS EXTRA STRENGTH PO) Take by mouth as needed. Arthritis (Patient not taking: Reported on 08/31/2022)   No facility-administered encounter medications on file as of 08/31/2022.    Allergies (verified) Penicillins   History: Past Medical History:  Diagnosis Date   Atrial fibrillation ([place]    Post operative, maintaining normal sinus rhythm on amiodarone   Benign prostatic hypertrophy    CAD (coronary artery disease)    Non obstructive by catheterization Feb 2007  prior to his surgery revealing a 40-50% LAD lesion, good LV function with EF 55-60% by last echocardiogram prior to his surgery   Hyperlipidemia    Treated   Mitral regurgitation    S/P mitral valve repair Oct 17, 2005 by Dr. ORoxy Manns  Skin cancer    Past Surgical History:  Procedure Laterality Date   CARDIAC CATHETERIZATION  10/2005   COLONOSCOPY     MITRAL VALVE REPAIR  10/17/2005   Dr. ORoxy Manns  POLYPECTOMY     SKIN CANCER EXCISION Left    cheek   Family History  Problem Relation Age of Onset   COPD Father    Cancer Brother        melanoma   Heart disease Neg Hx    Colon cancer Neg Hx    Social History   Socioeconomic History   Marital status: Married    Spouse name: Not on file   Number of children: 2   Years of education: Not on file   Highest education level: Not on file  Occupational History   Occupation: Retired from [employer] Tobacco Use   Smoking status: Former    Types: Cigarettes   Smokeless tobacco: Never  Vaping Use   Vaping Use: Never used  Substance and Sexual Activity   Alcohol use: No   Drug use: No   Sexual activity: Not on file  Other Topics Concern   Not on file  Social History Narrative   Mar[place]  His wife is paralyzed and he cares for her - bathing her, etc   She is now under hospice care in home.   Social Determinants of Health   Financial Resource Strain: Low Risk  (08/31/2022)   Overall Financial Resource Strain (CARDIA)    Difficulty of Paying Living Expenses: Not hard at all  Food Insecurity: No Food Insecurity (08/31/2022)   Hunger Vital Sign    Worried About Running Out of Food in the Last Year: Never true    Ran Out of Food in the Last Year: Never true  Transportation Needs: No Transportation Needs (08/30/2021)   PRAPARE - Hydrologist (Medical): No    Lack of Transportation (Non-Medical): No  Physical Activity: Sufficiently Active (08/31/2022)   Exercise Vital Sign    Days  of Exercise per Week: 5 days    Minutes of Exercise per Session: 30 min  Stress: No Stress Concern Present (08/31/2022)   [place]    Feeling of Stress : Not at all  Social Connections: Moderately Integrated (08/31/2022)   Social Connection and Isolation Panel [NHANES]    Frequency of Communication with Friends and Family: More than three times a week    Frequency of Social Gatherings with Friends and Family: More than three times a week    Attends Religious Services: More than 4 times per year    Active Member of [employer] or Organizations: Yes    Attends Archivist Meetings: More than 4 times per year    Marital Status: Widowed    Tobacco Counseling Counseling given: Not Answered   Clinical Intake:  Pre-visit preparation completed: Yes  Pain : No/denies pain     Nutritional Risks: None Diabetes: No  How often do you need to have someone help you when you read instructions, pamphlets, or other written materials from your doctor or pharmacy?: 1 - Never  Diabetic?no   Interpreter Needed?: No  Information entered by :: Jadene Pierini, LPN   Activities of Daily Living    08/31/2022    2:55 PM  In your present state of health, do you have any difficulty performing the following activities:  Hearing? 0  Vision? 0  Difficulty concentrating or making decisions? 0  Walking or climbing stairs? 0  Dressing or bathing? 0  Doing errands, shopping? 0  Preparing Food and eating ? N  Using the Toilet? N  In the past six months, have you accidently leaked urine? N  Do you have problems with loss of bowel control? N  Managing your Medications? N  Managing your Finances? N  Housekeeping or managing your Housekeeping? N    Patient Care Team: Dettinger, Fransisca Kaufmann, MD as PCP - General (Family Medicine)  Indicate any recent Medical Services you may have received from other than [facility] providers in the past  year (date may be approximate).     Assessment:   This is a routine wellness examination for Amen.  Hearing/Vision screen Vision Screening - Comments:: Wears rx glasses - up to date with routine eye exams with  Dr.Lee  Dietary issues and exercise activities discussed: Current Exercise Habits: Home exercise routine, Type of exercise: walking, Time (Minutes): 30, Frequency (Times/Week): 5, Weekly Exercise (Minutes/Week): 150, Intensity: Mild, Exercise limited by: None identified   Goals Addressed             This Visit's Progress    Patient Stated   On track  Stay healthy and active       Depression Screen    08/31/2022    2:54 PM 06/01/2022    8:23 AM 11/09/2021   11:23 AM 08/30/2021    3:33 PM 05/21/2020   11:03 AM 11/19/2019   11:28 AM 10/30/2018   10:24 AM  PHQ 2/9 Scores  PHQ - 2 Score 0 0 0 0 0 0 0  PHQ- 9 Score  0         Fall Risk    08/31/2022    2:52 PM 06/01/2022    8:23 AM 11/09/2021   11:23 AM 08/30/2021    3:34 PM 05/21/2020   11:03 AM  [place] in the past year? 0 0 0 1 0  Number falls in past yr: 0   1   Injury with Fall? 0   0   Risk for fall due to : No Fall Risks   History of fall(s)   Follow up Falls prevention discussed   Falls prevention discussed;Education provided     FALL RISK PREVENTION PERTAINING TO THE HOME:  Any stairs in or around the home? Yes  If so, are there any without handrails? No  Home free of loose throw rugs in walkways, pet beds, electrical cords, etc? Yes  Adequate lighting in your home to reduce risk of falls? Yes   ASSISTIVE DEVICES UTILIZED TO PREVENT FALLS:  Life alert? No  Use of a cane, walker or w/c? No  Grab bars in the bathroom? Yes  Shower chair or bench in shower? No  Elevated toilet seat or a handicapped toilet? No          08/31/2022    2:56 PM 08/30/2021    3:36 PM  6CIT Screen  What Year? 0 points 0 points  What month? 0 points 0 points  What time? 0 points 0 points  Count back  from 20 0 points 0 points  Months in reverse 0 points 0 points  Repeat phrase 0 points 6 points  Total Score 0 points 6 points    Immunizations Immunization History  Administered Date(s) Administered   Fluad Quad(high Dose 65+) 07/09/2019   Influenza, High Dose Seasonal PF 06/26/2013, 06/12/2016, 06/18/2017, 06/24/2018   Influenza,inj,Quad PF,6+ Mos 06/15/2014, 06/21/2015   Influenza-Unspecified 07/07/2009, 06/07/2010, 07/05/2011   PFIZER(Purple Top)SARS-COV-2 Vaccination 07/12/2020, 08/11/2020   Pneumococcal Conjugate-13 04/07/2015   Pneumococcal Polysaccharide-23 05/26/2011   Td 05/26/2011   Tdap 05/26/2011, 11/09/2021   Zoster Recombinat (Shingrix) 11/09/2021, 06/01/2022   Zoster, Live 08/19/2012    TDAP status: Up to date  Flu Vaccine status: Up to date  Pneumococcal vaccine status: Up to date  Covid-19 vaccine status: Completed vaccines  Qualifies for Shingles Vaccine? Yes   Zostavax completed Yes   Shingrix Completed?: Yes  Screening Tests Health Maintenance  Topic Date Due   COVID-19 Vaccine (3 - Pfizer risk series) 09/08/2020   INFLUENZA VACCINE  12/10/2022 (Originally 04/11/2022)   Medicare Annual Wellness (AWV)  09/01/2023   DTaP/Tdap/Td (4 - Td or Tdap) 11/10/2031   Pneumonia Vaccine 43+ Years old  Completed   Zoster Vaccines- Shingrix  Completed   HPV VACCINES  Aged Out    Health Maintenance  Health Maintenance Due  Topic Date Due   COVID-19 Vaccine (3 - Pfizer risk series) 09/08/2020    Colorectal cancer screening: No longer required.   Lung Cancer Screening: (Low Dose CT Chest recommended if Age 33-80 years, 30 pack-year currently smoking OR have quit  w/in 15years.) does not qualify.   Lung Cancer Screening Referral: n/a  Additional Screening:  Hepatitis C Screening: does not qualify;   Vision Screening: Recommended annual ophthalmology exams for early detection of glaucoma and other disorders of the eye. Is the patient up to date with their  annual eye exam?  Yes  Who is the provider or what is the name of the office in which the patient attends annual eye exams? Dr.Lee If pt is not established with a provider, would they like to be referred to a provider to establish care? No .   Dental Screening: Recommended annual dental exams for proper oral hygiene  Community Resource Referral / Chronic Care Management: CRR required this visit?  No   CCM required this visit?  No      Plan:     I have personally reviewed and noted the following in the patient's chart:   Medical and social history Use of alcohol, tobacco or illicit drugs  Current medications and supplements including opioid prescriptions. Patient is not currently taking opioid prescriptions. Functional ability and status Nutritional status Physical activity Advanced directives List of other physicians Hospitalizations, surgeries, and ER visits in previous 12 months Vitals Screenings to include cognitive, depression, and falls Referrals and appointments  In addition, I have reviewed and discussed with patient certain preventive protocols, quality metrics, and best practice recommendations. A written personalized care plan for preventive services as well as general preventive health recommendations were provided to patient.     Daphane Shepherd, LPN   [id number]   Nurse Notes: none

## 2022-10-02 DIAGNOSIS — L570 Actinic keratosis: Secondary | ICD-10-CM | POA: Diagnosis not present

## 2022-10-02 DIAGNOSIS — Z08 Encounter for follow-up examination after completed treatment for malignant neoplasm: Secondary | ICD-10-CM | POA: Diagnosis not present

## 2022-10-02 DIAGNOSIS — Z85828 Personal history of other malignant neoplasm of skin: Secondary | ICD-10-CM | POA: Diagnosis not present

## 2022-10-02 DIAGNOSIS — X32XXXD Exposure to sunlight, subsequent encounter: Secondary | ICD-10-CM | POA: Diagnosis not present

## 2022-10-02 DIAGNOSIS — D0462 Carcinoma in situ of skin of left upper limb, including shoulder: Secondary | ICD-10-CM | POA: Diagnosis not present

## 2022-12-01 ENCOUNTER — Ambulatory Visit (INDEPENDENT_AMBULATORY_CARE_PROVIDER_SITE_OTHER): Payer: Medicare Other | Admitting: Family Medicine

## 2022-12-01 ENCOUNTER — Encounter: Payer: Self-pay | Admitting: Family Medicine

## 2022-12-01 VITALS — BP 158/74 | HR 56 | Ht 67.0 in | Wt 156.0 lb

## 2022-12-01 DIAGNOSIS — E782 Mixed hyperlipidemia: Secondary | ICD-10-CM

## 2022-12-01 DIAGNOSIS — I10 Essential (primary) hypertension: Secondary | ICD-10-CM | POA: Insufficient documentation

## 2022-12-01 LAB — LIPID PANEL

## 2022-12-01 MED ORDER — AMLODIPINE BESYLATE 5 MG PO TABS: 5.0000 mg | ORAL_TABLET | Freq: Every day | ORAL | 3 refills | Status: DC

## 2022-12-01 MED ORDER — ATORVASTATIN CALCIUM 20 MG PO TABS: 20.0000 mg | ORAL_TABLET | Freq: Every day | ORAL | 3 refills | Status: DC

## 2022-12-01 NOTE — Progress Notes (Signed)
BP (!) 158/74   Pulse (!) 56   Ht 5\' 7"  (1.702 m)   Wt 70.8 kg   SpO2 98%   BMI 24.43 kg/m    Subjective:   Patient ID: Kyle [name], male    DOB: [birth year], 88 y.o.   MRN: [id number]  HPI: Kyle [name] is a 88 y.o. male presenting on 12/01/2022 for Medical Management of Chronic Issues and Hyperlipidemia  Hyperlipidemia Patient is coming in for recheck of his hyperlipidemia. The patient is currently taking Atorvastatin. They deny any issues with myalgias or history of liver damage from it. They deny any focal numbness or weakness or chest pain.   Patient's blood pressure today is 172/82. Recheck blood pressure was 158/74. PA student took a third time 10 minutes later and was 160/79. Patient has not checked his blood pressure at home in 3 weeks and does not remember the numbers. Patient denies lightheadedness, dizziness, weakness, headaches, blurred vision, chest pains, and shortness of breath.   Relevant past medical, surgical, family and social history were reviewed and updated as indicated. Interim medical history were reviewed. Allergies and medications were reviewed and updated.  Review of Systems  Constitutional:  Negative for chills and fever.  Respiratory:  Negative for chest tightness and shortness of breath.   Cardiovascular:  Negative for chest pain and leg swelling.  Gastrointestinal:  Negative for abdominal pain.  Musculoskeletal:  Negative for back pain, gait problem and myalgias.  Skin:  Negative for rash.  Neurological:  Negative for dizziness, weakness, light-headedness and headaches.  Psychiatric/Behavioral:  Negative for behavioral problems and confusion.   All other systems reviewed and are negative.   Per HPI unless specifically indicated above.   Allergies as of 12/01/2022       Reactions   Penicillins    REACTION: dizziness/syncope        Medication List        Accurate as of December 01, 2022  8:47 AM. If you have any questions, ask your nurse  or doctor.          amLODipine 5 MG tablet Commonly known as: NORVASC Take 1 tablet (5 mg total) by mouth daily. Started by: Worthy Rancher, MD   aspirin 81 MG tablet Take 81 mg by mouth daily.   atorvastatin 20 MG tablet Commonly known as: LIPITOR Take 1 tablet (20 mg total) by mouth daily.   fish oil-omega-3 fatty acids 1000 MG capsule Take 1 capsule by mouth daily.   GOODYS EXTRA STRENGTH PO Take by mouth as needed. Arthritis   OSTEO BI-FLEX REGULAR STRENGTH PO Take 1 tablet by mouth daily.         Objective:   BP (!) 158/74   Pulse (!) 56   Ht 5\' 7"  (1.702 m)   Wt 70.8 kg   SpO2 98%   BMI 24.43 kg/m   Wt Readings from Last 3 Encounters:  12/01/22 70.8 kg  08/31/22 68 kg  06/01/22 70.8 kg    Physical Exam Vitals reviewed.  Constitutional:      Appearance: Normal appearance.  HENT:     Head: Normocephalic and atraumatic.  Eyes:     General: No scleral icterus.    Conjunctiva/sclera: Conjunctivae normal.  Neck:     Thyroid: No thyromegaly.  Cardiovascular:     Rate and Rhythm: Regular rhythm. Bradycardia present.     Heart sounds: Normal heart sounds.  Pulmonary:     Effort: Pulmonary effort is normal.  Breath sounds: Normal breath sounds.  Abdominal:     Tenderness: There is no right CVA tenderness or left CVA tenderness.  Musculoskeletal:     Cervical back: Neck supple. No tenderness.     Right lower leg: No edema.     Left lower leg: No edema.  Lymphadenopathy:     Cervical: No cervical adenopathy.  Skin:    General: Skin is warm and dry.  Neurological:     Mental Status: He is alert and oriented to person, place, and time.  Psychiatric:        Mood and Affect: Mood normal.        Behavior: Behavior normal.       Assessment & Plan:   Problem List Items Addressed This Visit       Cardiovascular and Mediastinum   Hypertension   Relevant Medications   atorvastatin (LIPITOR) 20 MG tablet   amLODipine (NORVASC) 5 MG  tablet   Other Relevant Orders   CBC with Differential/Platelet   CMP14+EGFR   Lipid panel     Other   Hyperlipidemia - Primary   Relevant Medications   atorvastatin (LIPITOR) 20 MG tablet   amLODipine (NORVASC) 5 MG tablet   Other Relevant Orders   CBC with Differential/Platelet   CMP14+EGFR   Lipid panel    Will do lab work today. Patient is going to start Amlodipine 5 mg once daily for hypertension. Patient was instructed to take his blood pressure at home and report readings to preceptor. Patient informed that medication may cause lower extremity swelling. Continue all other medications.  Follow up plan: Return in about 6 months (around 06/03/2023), or if symptoms worsen or fail to improve, for Hypertension and hyperlipidemia.  Counseling provided for all of the vaccine components Orders Placed This Encounter  Procedures   CBC with Differential/Platelet   CMP14+EGFR   Lipid panel    [address] PA-S2 [facility] 12/01/2022, 8:47 AM  Patient seen and examined with PA student, agree with assessment and plan above.  We are going to start amlodipine and he is going to check his blood pressures at home and let us know how it does. Vonna Kotyk Briggette [facility] [employer] [facility] 12/01/2022, 8:47 AM

## 2022-12-02 LAB — CBC WITH DIFFERENTIAL/PLATELET
Basophils Absolute: 0.1 10*3/uL (ref 0.0–0.2)
Basos: 1 %
EOS (ABSOLUTE): 0.1 10*3/uL (ref 0.0–0.4)
Eos: 1 %
Hematocrit: 41.3 % (ref 37.5–51.0)
Hemoglobin: 13.6 g/dL (ref 13.0–17.7)
Immature Grans (Abs): 0 10*3/uL (ref 0.0–0.1)
Immature Granulocytes: 0 %
Lymphocytes Absolute: 2.8 10*3/uL (ref 0.7–3.1)
Lymphs: 35 %
MCH: 29.5 pg (ref 26.6–33.0)
MCHC: 32.9 g/dL (ref 31.5–35.7)
MCV: 90 fL (ref 79–97)
Monocytes Absolute: 0.5 10*3/uL (ref 0.1–0.9)
Monocytes: 7 %
Neutrophils Absolute: 4.6 10*3/uL (ref 1.4–7.0)
Neutrophils: 56 %
Platelets: 261 10*3/uL (ref 150–450)
RBC: 4.61 x10E6/uL (ref 4.14–5.80)
RDW: 12.9 % (ref 11.6–15.4)
WBC: 8.1 10*3/uL (ref 3.4–10.8)

## 2022-12-02 LAB — CMP14+EGFR
ALT: 8 IU/L (ref 0–44)
AST: 14 IU/L (ref 0–40)
Albumin/Globulin Ratio: 2.1 (ref 1.2–2.2)
Albumin: 4.5 g/dL (ref 3.7–4.7)
Alkaline Phosphatase: 54 IU/L (ref 44–121)
BUN/Creatinine Ratio: 12 (ref 10–24)
BUN: 9 mg/dL (ref 8–27)
Bilirubin Total: 0.5 mg/dL (ref 0.0–1.2)
CO2: 25 mmol/L (ref 20–29)
Calcium: 9.4 mg/dL (ref 8.6–10.2)
Chloride: 102 mmol/L (ref 96–106)
Creatinine, Ser: 0.76 mg/dL (ref 0.76–1.27)
Globulin, Total: 2.1 g/dL (ref 1.5–4.5)
Glucose: 111 mg/dL — ABNORMAL HIGH (ref 70–99)
Potassium: 5.4 mmol/L — ABNORMAL HIGH (ref 3.5–5.2)
Sodium: 140 mmol/L (ref 134–144)
Total Protein: 6.6 g/dL (ref 6.0–8.5)
eGFR: 88 mL/min/{1.73_m2} (ref >59)

## 2022-12-02 LAB — LIPID PANEL
Chol/HDL Ratio: 4.8 ratio (ref 0.0–5.0)
Cholesterol, Total: 219 mg/dL — ABNORMAL HIGH (ref 100–199)
HDL: 46 mg/dL (ref >39)
LDL Chol Calc (NIH): 150 mg/dL — ABNORMAL HIGH (ref 0–99)
Triglycerides: 130 mg/dL (ref 0–149)
VLDL Cholesterol Cal: 23 mg/dL (ref 5–40)

## 2022-12-11 NOTE — Progress Notes (Signed)
Patient r/ c °

## 2022-12-15 ENCOUNTER — Telehealth: Payer: Self-pay

## 2022-12-15 NOTE — Telephone Encounter (Signed)
Per Dettinger pts BP readings look good and he has no concerns. Readings sent to be scanned.

## 2023-05-28 DIAGNOSIS — L570 Actinic keratosis: Secondary | ICD-10-CM | POA: Diagnosis not present

## 2023-05-28 DIAGNOSIS — L928 Other granulomatous disorders of the skin and subcutaneous tissue: Secondary | ICD-10-CM | POA: Diagnosis not present

## 2023-05-28 DIAGNOSIS — X32XXXD Exposure to sunlight, subsequent encounter: Secondary | ICD-10-CM | POA: Diagnosis not present

## 2023-06-04 ENCOUNTER — Ambulatory Visit (INDEPENDENT_AMBULATORY_CARE_PROVIDER_SITE_OTHER): Payer: Medicare Other | Admitting: Family Medicine

## 2023-06-04 ENCOUNTER — Encounter: Payer: Self-pay | Admitting: Family Medicine

## 2023-06-04 VITALS — BP 132/76 | HR 60 | Temp 98.5°F | Ht 67.0 in | Wt 151.0 lb

## 2023-06-04 DIAGNOSIS — I480 Paroxysmal atrial fibrillation: Secondary | ICD-10-CM | POA: Diagnosis not present

## 2023-06-04 DIAGNOSIS — I10 Essential (primary) hypertension: Secondary | ICD-10-CM

## 2023-06-04 DIAGNOSIS — E782 Mixed hyperlipidemia: Secondary | ICD-10-CM

## 2023-06-04 DIAGNOSIS — Z125 Encounter for screening for malignant neoplasm of prostate: Secondary | ICD-10-CM

## 2023-06-04 LAB — LIPID PANEL

## 2023-06-04 NOTE — Progress Notes (Signed)
BP 132/76   Pulse 60   Temp 98.5 F (36.9 C)   Ht 5\' 7"  (1.702 m)   Wt 151 lb (68.5 kg)   SpO2 97%   BMI 23.65 kg/m    Subjective:   Patient ID: Kyle [name], male    DOB: 10-23-1934, 88 y.o.   MRN: [id number]  HPI: Kyle [name] is a 88 y.o. male presenting on 06/04/2023 for Medical Management of Chronic Issues   HPI Hyperlipidemia Patient is coming in for recheck of his hyperlipidemia. The patient is currently taking atorvastatin and fish oils. They deny any issues with myalgias or history of liver damage from it. They deny any focal numbness or weakness or chest pain.   Hypertension Patient is currently on amlodipine, and their blood pressure today is 132/76. Patient denies any lightheadedness or dizziness. Patient denies headaches, blurred vision, chest pains, shortness of breath, or weakness. Denies any side effects from medication and is content with current medication.   A-fib recheck Patient has a history of being diagnosed with A-fib but has not recurred into it and denies any symptoms.  Saw cardiology in the past and was deemed not to go onto anticoagulation at this point.  Relevant past medical, surgical, family and social history reviewed and updated as indicated. Interim medical history since our last visit reviewed. Allergies and medications reviewed and updated.  Review of Systems  Constitutional:  Negative for chills and fever.  Eyes:  Negative for visual disturbance.  Respiratory:  Negative for shortness of breath and wheezing.   Cardiovascular:  Negative for chest pain and leg swelling.  Musculoskeletal:  Negative for back pain and gait problem.  Skin:  Negative for rash.  Neurological:  Negative for dizziness, weakness and light-headedness.  All other systems reviewed and are negative.   Per HPI unless specifically indicated above   Allergies as of 06/04/2023       Reactions   Penicillins    REACTION: dizziness/syncope        Medication List         Accurate as of June 04, 2023  8:17 AM. If you have any questions, ask your nurse or doctor.          amLODipine 5 MG tablet Commonly known as: NORVASC Take 1 tablet (5 mg total) by mouth daily.   aspirin 81 MG tablet Take 81 mg by mouth daily.   atorvastatin 20 MG tablet Commonly known as: LIPITOR Take 1 tablet (20 mg total) by mouth daily.   fish oil-omega-3 fatty acids 1000 MG capsule Take 1 capsule by mouth daily.   GOODYS EXTRA STRENGTH PO Take by mouth as needed. Arthritis   OSTEO BI-FLEX REGULAR STRENGTH PO Take 1 tablet by mouth daily.         Objective:   BP 132/76   Pulse 60   Temp 98.5 F (36.9 C)   Ht 5\' 7"  (1.702 m)   Wt 151 lb (68.5 kg)   SpO2 97%   BMI 23.65 kg/m   Wt Readings from Last 3 Encounters:  06/04/23 151 lb (68.5 kg)  12/01/22 156 lb (70.8 kg)  08/31/22 150 lb (68 kg)    Physical Exam Vitals and nursing note reviewed.  Constitutional:      General: He is not in acute distress.    Appearance: He is well-developed. He is not diaphoretic.  Eyes:     General: No scleral icterus.    Conjunctiva/sclera: Conjunctivae normal.  Neck:  Thyroid: No thyromegaly.  Cardiovascular:     Rate and Rhythm: Normal rate and regular rhythm.     Heart sounds: Normal heart sounds. No murmur heard. Pulmonary:     Effort: Pulmonary effort is normal. No respiratory distress.     Breath sounds: Normal breath sounds. No wheezing.  Musculoskeletal:        General: Normal range of motion.     Cervical back: Neck supple.  Lymphadenopathy:     Cervical: No cervical adenopathy.  Skin:    General: Skin is warm and dry.     Findings: No rash.  Neurological:     Mental Status: He is alert and oriented to person, place, and time.     Coordination: Coordination normal.  Psychiatric:        Behavior: Behavior normal.       Assessment & Plan:   Problem List Items Addressed This Visit       Cardiovascular and Mediastinum   Atrial  fibrillation (HCC)   Relevant Orders   CBC with Differential   Hypertension   Relevant Orders   CMP14+EGFR     Other   Hyperlipidemia - Primary   Relevant Orders   Lipid Panel   Other Visit Diagnoses     Prostate cancer screening       Relevant Orders   PSA       Will do blood work today, no change in medications. Follow up plan: Return in about 6 months (around 12/02/2023), or if symptoms worsen or fail to improve, for Physical exam and recheck hypertension.  Counseling provided for all of the vaccine components Orders Placed This Encounter  Procedures   Lipid Panel   CMP14+EGFR   CBC with Differential   PSA    Arville Care, MD [facility] 06/04/2023, 8:17 AM

## 2023-06-05 LAB — CBC WITH DIFFERENTIAL/PLATELET
Basophils Absolute: 0 10*3/uL (ref 0.0–0.2)
Basos: 1 %
EOS (ABSOLUTE): 0.1 10*3/uL (ref 0.0–0.4)
Eos: 1 %
Hematocrit: 39 % (ref 37.5–51.0)
Hemoglobin: 12.8 g/dL — ABNORMAL LOW (ref 13.0–17.7)
Immature Grans (Abs): 0 10*3/uL (ref 0.0–0.1)
Immature Granulocytes: 0 %
Lymphocytes Absolute: 1.9 10*3/uL (ref 0.7–3.1)
Lymphs: 33 %
MCH: 30.8 pg (ref 26.6–33.0)
MCHC: 32.8 g/dL (ref 31.5–35.7)
MCV: 94 fL (ref 79–97)
Monocytes Absolute: 0.7 10*3/uL (ref 0.1–0.9)
Monocytes: 12 %
Neutrophils Absolute: 3 10*3/uL (ref 1.4–7.0)
Neutrophils: 53 %
Platelets: 221 10*3/uL (ref 150–450)
RBC: 4.15 x10E6/uL (ref 4.14–5.80)
RDW: 12.7 % (ref 11.6–15.4)
WBC: 5.7 10*3/uL (ref 3.4–10.8)

## 2023-06-05 LAB — CMP14+EGFR
ALT: 10 IU/L (ref 0–44)
AST: 19 IU/L (ref 0–40)
Albumin: 3.9 g/dL (ref 3.7–4.7)
Alkaline Phosphatase: 58 IU/L (ref 44–121)
BUN/Creatinine Ratio: 11 (ref 10–24)
BUN: 8 mg/dL (ref 8–27)
Bilirubin Total: 0.4 mg/dL (ref 0.0–1.2)
CO2: 22 mmol/L (ref 20–29)
Calcium: 9 mg/dL (ref 8.6–10.2)
Chloride: 98 mmol/L (ref 96–106)
Creatinine, Ser: 0.72 mg/dL — ABNORMAL LOW (ref 0.76–1.27)
Globulin, Total: 2.4 g/dL (ref 1.5–4.5)
Glucose: 121 mg/dL — ABNORMAL HIGH (ref 70–99)
Potassium: 4.4 mmol/L (ref 3.5–5.2)
Sodium: 137 mmol/L (ref 134–144)
Total Protein: 6.3 g/dL (ref 6.0–8.5)
eGFR: 89 mL/min/{1.73_m2} (ref >59)

## 2023-06-05 LAB — LIPID PANEL
Cholesterol, Total: 186 mg/dL (ref 100–199)
HDL: 36 mg/dL — ABNORMAL LOW (ref >39)
LDL CALC COMMENT:: 5.2 ratio — ABNORMAL HIGH (ref 0.0–5.0)
LDL Chol Calc (NIH): 129 mg/dL — ABNORMAL HIGH (ref 0–99)
Triglycerides: 117 mg/dL (ref 0–149)
VLDL Cholesterol Cal: 21 mg/dL (ref 5–40)

## 2023-06-05 LAB — PSA: Prostate Specific Ag, Serum: 1.6 ng/mL (ref 0.0–4.0)

## 2023-06-14 NOTE — Progress Notes (Signed)
Patient returning call, please call back.

## 2023-09-06 ENCOUNTER — Ambulatory Visit: Payer: Medicare Other

## 2023-09-06 VITALS — Ht 67.0 in | Wt 151.0 lb

## 2023-09-06 DIAGNOSIS — Z0000 Encounter for general adult medical examination without abnormal findings: Secondary | ICD-10-CM | POA: Diagnosis not present

## 2023-09-06 NOTE — Progress Notes (Signed)
Subjective:   Kyle [name] is a 88 y.o. male who presents for Medicare Annual/Subsequent preventive examination.  Visit Complete: Virtual I connected with  Kyle [name] on 09/06/23 by a audio enabled telemedicine application and verified that I am speaking with the correct person using two identifiers.  Patient Location: Home  Provider Location: Home Office  I discussed the limitations of evaluation and management by telemedicine. The patient expressed understanding and agreed to proceed.  Vital Signs: Because this visit was a virtual/telehealth visit, some criteria may be missing or patient reported. Any vitals not documented were not able to be obtained and vitals that have been documented are patient reported.  Cardiac Risk Factors include: advanced age (>39men, >35 women);dyslipidemia;male gender;hypertension     Objective:    Today's Vitals   09/06/23 1311  Weight: 151 lb (68.5 kg)  Height: 5\' 7"  (1.702 m)   Body mass index is 23.65 kg/m.     09/06/2023    1:17 PM 08/31/2022    2:55 PM 08/30/2021    3:39 PM 08/12/2014   12:44 PM  Advanced Directives  Does Patient Have a Medical Advance Directive? No No No No  Would patient like information on creating a medical advance directive? Yes (MAU/Ambulatory/Procedural Areas - Information given) No - Patient declined No - Patient declined     Current Medications (verified) Outpatient Encounter Medications as of 09/06/2023  Medication Sig   amLODipine (NORVASC) 5 MG tablet Take 1 tablet (5 mg total) by mouth daily.   aspirin 81 MG tablet Take 81 mg by mouth daily.     Aspirin-Acetaminophen-Caffeine (GOODYS EXTRA STRENGTH PO) Take by mouth as needed. Arthritis   atorvastatin (LIPITOR) 20 MG tablet Take 1 tablet (20 mg total) by mouth daily.   fish oil-omega-3 fatty acids 1000 MG capsule Take 1 capsule by mouth daily.     Glucosamine-Chondroitin (OSTEO BI-FLEX REGULAR STRENGTH PO) Take 1 tablet by mouth daily.   No  facility-administered encounter medications on file as of 09/06/2023.    Allergies (verified) Penicillins   History: Past Medical History:  Diagnosis Date   Atrial fibrillation (HCC)    Post operative, maintaining normal sinus rhythm on amiodarone   Benign prostatic hypertrophy    CAD (coronary artery disease)    Non obstructive by catheterization Feb 2007 prior to his surgery revealing a 40-50% LAD lesion, good LV function with EF 55-60% by last echocardiogram prior to his surgery   Hyperlipidemia    Treated   Mitral regurgitation    S/P mitral valve repair Oct 17, 2005 by Dr. [name]   Skin cancer    Past Surgical History:  Procedure Laterality Date   CARDIAC CATHETERIZATION  10/2005   COLONOSCOPY     MITRAL VALVE REPAIR  10/17/2005   Dr. [name]   POLYPECTOMY     SKIN CANCER EXCISION Left    cheek   Family History  Problem Relation Age of Onset   COPD Father    Cancer Brother        melanoma   Heart disease Neg Hx    Colon cancer Neg Hx    Social History   Socioeconomic History   Marital status: Married    Spouse name: Not on file   Number of children: 2   Years of education: Not on file   Highest education level: Not on file  Occupational History   Occupation: Retired from plumbing  Tobacco Use   Smoking status: Former    Types:  Cigarettes   Smokeless tobacco: Never  Vaping Use   Vaping status: Never Used  Substance and Sexual Activity   Alcohol use: No   Drug use: No   Sexual activity: Not on file  Other Topics Concern   Not on file  Social History Narrative   Married. Lives in [place]   His wife is paralyzed and he cares for her - bathing her, etc   She is now under hospice care in home.   Social Drivers of Corporate investment banker Strain: Low Risk  (09/06/2023)   Overall Financial Resource Strain (CARDIA)    Difficulty of Paying Living Expenses: Not hard at all  Food Insecurity: No Food Insecurity (09/06/2023)   Hunger Vital Sign     Worried About Running Out of Food in the Last Year: Never true    Ran Out of Food in the Last Year: Never true  Transportation Needs: No Transportation Needs (09/06/2023)   PRAPARE - Administrator, Civil Service (Medical): No    Lack of Transportation (Non-Medical): No  Physical Activity: Insufficiently Active (09/06/2023)   Exercise Vital Sign    Days of Exercise per Week: 3 days    Minutes of Exercise per Session: 30 min  Stress: No Stress Concern Present (09/06/2023)   [employer] of Occupational Health - Occupational Stress Questionnaire    Feeling of Stress : Not at all  Social Connections: Moderately Integrated (09/06/2023)   Social Connection and Isolation Panel [NHANES]    Frequency of Communication with Friends and Family: More than three times a week    Frequency of Social Gatherings with Friends and Family: Three times a week    Attends Religious Services: More than 4 times per year    Active Member of Clubs or Organizations: Yes    Attends Banker Meetings: 1 to 4 times per year    Marital Status: Widowed    Tobacco Counseling Counseling given: Not Answered   Clinical Intake:  Pre-visit preparation completed: Yes  Pain : No/denies pain     Diabetes: No  How often do you need to have someone help you when you read instructions, pamphlets, or other written materials from your doctor or pharmacy?: 1 - Never  Interpreter Needed?: No  Information entered by :: Kandis Fantasia LPN   Activities of Daily Living    09/06/2023    1:06 PM  In your present state of health, do you have any difficulty performing the following activities:  Hearing? 0  Vision? 0  Difficulty concentrating or making decisions? 0  Walking or climbing stairs? 0  Dressing or bathing? 0  Doing errands, shopping? 0  Preparing Food and eating ? N  Using the Toilet? N  In the past six months, have you accidently leaked urine? N  Do you have problems with loss  of bowel control? N  Managing your Medications? N  Managing your Finances? N  Housekeeping or managing your Housekeeping? N    Patient Care Team: Dettinger, Elige Radon, MD as PCP - General (Family Medicine) Dettinger, Elige Radon, MD as Consulting Physician (Family Medicine) Nita Sells, MD (Dermatology) Conley Rolls, [place], [place] as Referring Physician (Optometry)  Indicate any recent Medical Services you may have received from other than [facility] providers in the past year (date may be approximate).     Assessment:   This is a routine wellness examination for Kyle [name].  Hearing/Vision screen Hearing Screening - Comments:: Denies hearing difficulties  Vision Screening - Comments:: Wears rx glasses - up to date with routine eye exams with Dr. Conley Rolls     Goals Addressed   None   Depression Screen    09/06/2023    1:14 PM 06/04/2023    7:57 AM 12/01/2022    8:06 AM 08/31/2022    2:54 PM 06/01/2022    8:23 AM 11/09/2021   11:23 AM 08/30/2021    3:33 PM  PHQ 2/9 Scores  PHQ - 2 Score 0 0 0 0 0 0 0  PHQ- 9 Score  0   0      Fall Risk    09/06/2023    1:18 PM 06/04/2023    7:56 AM 12/01/2022    8:30 AM 12/01/2022    8:06 AM 08/31/2022    2:52 PM  Fall Risk   Falls in the past year? 0 0 0 0 0  Number falls in past yr: 0 0   0  Injury with Fall? 0 0   0  Risk for fall due to : No Fall Risks No Fall Risks   No Fall Risks  Follow up Falls prevention discussed;Education provided;Falls evaluation completed Education provided   Falls prevention discussed    MEDICARE RISK AT HOME: Medicare Risk at Home Any stairs in or around the home?: No If so, are there any without handrails?: No Home free of loose throw rugs in walkways, pet beds, electrical cords, etc?: Yes Adequate lighting in your home to reduce risk of falls?: Yes Life alert?: No Use of a cane, walker or w/c?: No Grab bars in the bathroom?: Yes Shower chair or bench in shower?: No Elevated toilet seat or a handicapped toilet?: Yes  TIMED  UP AND GO:  Was the test performed?  No    Cognitive Function:        09/06/2023    1:18 PM 08/31/2022    2:56 PM 08/30/2021    3:36 PM  6CIT Screen  What Year? 0 points 0 points 0 points  What month? 0 points 0 points 0 points  What time? 0 points 0 points 0 points  Count back from 20 0 points 0 points 0 points  Months in reverse 2 points 0 points 0 points  Repeat phrase 0 points 0 points 6 points  Total Score 2 points 0 points 6 points    Immunizations Immunization History  Administered Date(s) Administered   Fluad Quad(high Dose 65+) 07/09/2019   Influenza, High Dose Seasonal PF 06/26/2013, 06/12/2016, 06/18/2017, 06/24/2018   Influenza,inj,Quad PF,6+ Mos 06/15/2014, 06/21/2015   Influenza-Unspecified 07/07/2009, 06/07/2010, 07/05/2011   PFIZER(Purple Top)SARS-COV-2 Vaccination 07/12/2020, 08/11/2020   Pneumococcal Conjugate-13 04/07/2015   Pneumococcal Polysaccharide-23 05/26/2011   Td 05/26/2011   Tdap 05/26/2011, 11/09/2021   Zoster Recombinant(Shingrix) 11/09/2021, 06/01/2022   Zoster, Live 08/19/2012    TDAP status: Up to date  Flu Vaccine status: Declined, Education has been provided regarding the importance of this vaccine but patient still declined. Advised may receive this vaccine at local pharmacy or Health Dept. Aware to provide a copy of the vaccination record if obtained from local pharmacy or Health Dept. Verbalized acceptance and understanding.  Pneumococcal vaccine status: Up to date  Covid-19 vaccine status: Information provided on how to obtain vaccines.   Qualifies for Shingles Vaccine? Yes   Zostavax completed Yes   Shingrix Completed?: Yes  Screening Tests Health Maintenance  Topic Date Due   COVID-19 Vaccine (3 - Pfizer risk series) 09/08/2020   INFLUENZA VACCINE  12/10/2023 (Originally 04/12/2023)   Medicare Annual Wellness (AWV)  09/05/2024   DTaP/Tdap/Td (4 - Td or Tdap) 11/10/2031   Pneumonia Vaccine 46+ Years old  Completed    Zoster Vaccines- Shingrix  Completed   HPV VACCINES  Aged Out    Health Maintenance  Health Maintenance Due  Topic Date Due   COVID-19 Vaccine (3 - Pfizer risk series) 09/08/2020    Colorectal cancer screening: No longer required.   Lung Cancer Screening: (Low Dose CT Chest recommended if Age 22-80 years, 20 pack-year currently smoking OR have quit w/in 15years.) does not qualify.   Lung Cancer Screening Referral: n/a  Additional Screening:  Hepatitis C Screening: does not qualify  Vision Screening: Recommended annual ophthalmology exams for early detection of glaucoma and other disorders of the eye. Is the patient up to date with their annual eye exam?  Yes  Who is the provider or what is the name of the office in which the patient attends annual eye exams? Dr. Conley Rolls  If pt is not established with a provider, would they like to be referred to a provider to establish care? No .   Dental Screening: Recommended annual dental exams for proper oral hygiene  Community Resource Referral / Chronic Care Management: CRR required this visit?  No   CCM required this visit?  No     Plan:     I have personally reviewed and noted the following in the patient's chart:   Medical and social history Use of alcohol, tobacco or illicit drugs  Current medications and supplements including opioid prescriptions. Patient is not currently taking opioid prescriptions. Functional ability and status Nutritional status Physical activity Advanced directives List of other physicians Hospitalizations, surgeries, and ER visits in previous 12 months Vitals Screenings to include cognitive, depression, and falls Referrals and appointments  In addition, I have reviewed and discussed with patient certain preventive protocols, quality metrics, and best practice recommendations. A written personalized care plan for preventive services as well as general preventive health recommendations were provided to  patient.     Kandis Fantasia [place], [place]   28/41/3244   After Visit Summary: (Mail) Due to this being a telephonic visit, the after visit summary with patients personalized plan was offered to patient via mail   Nurse Notes: No concerns at this time

## 2023-09-06 NOTE — Patient Instructions (Signed)
[name] , Thank you for taking time to come for your Medicare Wellness Visit. I appreciate your ongoing commitment to your health goals. Please review the following plan we discussed and let me know if I can assist you in the future.   Referrals/Orders/Follow-Ups/Clinician Recommendations: Aim for 30 minutes of exercise or brisk walking, 6-8 glasses of water, and 5 servings of fruits and vegetables each day.  This is a list of the screening recommended for you and due dates:  Health Maintenance  Topic Date Due   COVID-19 Vaccine (3 - Pfizer risk series) 09/08/2020   Flu Shot  12/10/2023*   Medicare Annual Wellness Visit  09/05/2024   DTaP/Tdap/Td vaccine (4 - Td or Tdap) 11/10/2031   Pneumonia Vaccine  Completed   Zoster (Shingles) Vaccine  Completed   HPV Vaccine  Aged Out  *Topic was postponed. The date shown is not the original due date.    Advanced directives: (ACP Link)Information on Advanced Care Planning can be found at [facility] [place] Advance Health Care Directives Advance Health Care Directives ([email or web address]   Next Medicare Annual Wellness Visit scheduled for next year: Yes

## 2023-09-27 DIAGNOSIS — C44319 Basal cell carcinoma of skin of other parts of face: Secondary | ICD-10-CM | POA: Diagnosis not present

## 2023-09-27 DIAGNOSIS — X32XXXD Exposure to sunlight, subsequent encounter: Secondary | ICD-10-CM | POA: Diagnosis not present

## 2023-09-27 DIAGNOSIS — L570 Actinic keratosis: Secondary | ICD-10-CM | POA: Diagnosis not present

## 2023-11-08 DIAGNOSIS — Z85828 Personal history of other malignant neoplasm of skin: Secondary | ICD-10-CM | POA: Diagnosis not present

## 2023-11-08 DIAGNOSIS — Z08 Encounter for follow-up examination after completed treatment for malignant neoplasm: Secondary | ICD-10-CM | POA: Diagnosis not present

## 2023-11-08 DIAGNOSIS — C44619 Basal cell carcinoma of skin of left upper limb, including shoulder: Secondary | ICD-10-CM | POA: Diagnosis not present

## 2023-12-05 ENCOUNTER — Ambulatory Visit (INDEPENDENT_AMBULATORY_CARE_PROVIDER_SITE_OTHER): Payer: Medicare Other | Admitting: Family Medicine

## 2023-12-05 ENCOUNTER — Encounter: Payer: Self-pay | Admitting: Family Medicine

## 2023-12-05 VITALS — BP 151/66 | HR 61 | Ht 67.0 in | Wt 155.0 lb

## 2023-12-05 DIAGNOSIS — E782 Mixed hyperlipidemia: Secondary | ICD-10-CM | POA: Diagnosis not present

## 2023-12-05 DIAGNOSIS — I480 Paroxysmal atrial fibrillation: Secondary | ICD-10-CM

## 2023-12-05 DIAGNOSIS — Z0000 Encounter for general adult medical examination without abnormal findings: Secondary | ICD-10-CM | POA: Diagnosis not present

## 2023-12-05 DIAGNOSIS — I10 Essential (primary) hypertension: Secondary | ICD-10-CM | POA: Diagnosis not present

## 2023-12-05 DIAGNOSIS — Z0001 Encounter for general adult medical examination with abnormal findings: Secondary | ICD-10-CM | POA: Diagnosis not present

## 2023-12-05 DIAGNOSIS — R7301 Impaired fasting glucose: Secondary | ICD-10-CM | POA: Diagnosis not present

## 2023-12-05 LAB — LIPID PANEL

## 2023-12-05 LAB — BAYER DCA HB A1C WAIVED: HB A1C (BAYER DCA - WAIVED): 5.3 % (ref 4.8–5.6)

## 2023-12-05 MED ORDER — ATORVASTATIN CALCIUM 20 MG PO TABS: 20.0000 mg | ORAL_TABLET | Freq: Every day | ORAL | 3 refills | Status: DC

## 2023-12-05 MED ORDER — AMLODIPINE BESYLATE 5 MG PO TABS: 5.0000 mg | ORAL_TABLET | Freq: Every day | ORAL | 3 refills | Status: DC

## 2023-12-05 NOTE — Progress Notes (Signed)
 BP (!) 151/66   Pulse 61   Ht 5\' 7"  (1.702 m)   Wt 155 lb (70.3 kg)   SpO2 95%   BMI 24.28 kg/m    Subjective:   Patient ID: Kyle [name], male    DOB: 1935/05/13, 89 y.o.   MRN: [id number]  HPI: Kyle [name] is a 89 y.o. male presenting on 12/05/2023 for Medical Management of Chronic Issues (CPE), Hyperlipidemia, and Hypertension   HPI Physical exam Patient denies any chest pain, shortness of breath, headaches or vision issues, abdominal complaints, diarrhea, nausea, vomiting, or joint issues.   Hypertension and A-fib Patient is currently on amlodipine and aspirin, and their blood pressure today is 151/66. Patient denies any lightheadedness or dizziness. Patient denies headaches, blurred vision, chest pains, shortness of breath, or weakness. Denies any side effects from medication and is content with current medication.   Hyperlipidemia Patient is coming in for recheck of his hyperlipidemia. The patient is currently taking atorvastatin and fish oils. They deny any issues with myalgias or history of liver damage from it. They deny any focal numbness or weakness or chest pain.   Relevant past medical, surgical, family and social history reviewed and updated as indicated. Interim medical history since our last visit reviewed. Allergies and medications reviewed and updated.  Review of Systems  Constitutional:  Negative for chills and fever.  HENT:  Negative for ear pain and tinnitus.   Eyes:  Negative for pain.  Respiratory:  Negative for cough, shortness of breath and wheezing.   Cardiovascular:  Negative for chest pain, palpitations and leg swelling.  Gastrointestinal:  Negative for abdominal pain, blood in stool, constipation and diarrhea.  Genitourinary:  Negative for dysuria and hematuria.  Musculoskeletal:  Negative for back pain and myalgias.  Skin:  Negative for rash.  Neurological:  Negative for dizziness, weakness and headaches.  Psychiatric/Behavioral:  Negative for  suicidal ideas.     Per HPI unless specifically indicated above   Allergies as of 12/05/2023       Reactions   Penicillins    REACTION: dizziness/syncope        Medication List        Accurate as of December 05, 2023  8:21 AM. If you have any questions, ask your nurse or doctor.          amLODipine 5 MG tablet Commonly known as: NORVASC Take 1 tablet (5 mg total) by mouth daily.   aspirin 81 MG tablet Take 81 mg by mouth daily.   atorvastatin 20 MG tablet Commonly known as: LIPITOR Take 1 tablet (20 mg total) by mouth daily.   fish oil-omega-3 fatty acids 1000 MG capsule Take 1 capsule by mouth daily.   GOODYS EXTRA STRENGTH PO Take by mouth as needed. Arthritis   OSTEO BI-FLEX REGULAR STRENGTH PO Take 1 tablet by mouth daily.         Objective:   BP (!) 151/66   Pulse 61   Ht 5\' 7"  (1.702 m)   Wt 155 lb (70.3 kg)   SpO2 95%   BMI 24.28 kg/m   Wt Readings from Last 3 Encounters:  12/05/23 155 lb (70.3 kg)  09/06/23 151 lb (68.5 kg)  06/04/23 151 lb (68.5 kg)    Physical Exam Vitals reviewed.  Constitutional:      General: He is not in acute distress.    Appearance: He is well-developed. He is not diaphoretic.  HENT:     Right Ear: External ear  normal.     Left Ear: External ear normal.     Nose: Nose normal.     Mouth/Throat:     Pharynx: No oropharyngeal exudate.  Eyes:     General: No scleral icterus.       Right eye: No discharge.     Conjunctiva/sclera: Conjunctivae normal.     Pupils: Pupils are equal, round, and reactive to light.  Neck:     Thyroid: No thyromegaly.  Cardiovascular:     Rate and Rhythm: Normal rate and regular rhythm.     Heart sounds: Normal heart sounds. No murmur heard. Pulmonary:     Effort: Pulmonary effort is normal. No respiratory distress.     Breath sounds: Normal breath sounds. No wheezing.  Abdominal:     General: Bowel sounds are normal. There is no distension.     Palpations: Abdomen is soft.      Tenderness: There is no abdominal tenderness. There is no guarding or rebound.  Musculoskeletal:        General: No swelling. Normal range of motion.     Cervical back: Neck supple.  Lymphadenopathy:     Cervical: No cervical adenopathy.  Skin:    General: Skin is warm and dry.     Findings: No rash.  Neurological:     Mental Status: He is alert and oriented to person, place, and time.     Coordination: Coordination normal.  Psychiatric:        Behavior: Behavior normal.       Assessment & Plan:   Problem List Items Addressed This Visit       Cardiovascular and Mediastinum   Atrial fibrillation (HCC)   Relevant Medications   amLODipine (NORVASC) 5 MG tablet   atorvastatin (LIPITOR) 20 MG tablet   Other Relevant Orders   CBC with Differential/Platelet   CMP14+EGFR   Hypertension   Relevant Medications   amLODipine (NORVASC) 5 MG tablet   atorvastatin (LIPITOR) 20 MG tablet   Other Relevant Orders   CMP14+EGFR   Bayer DCA Hb A1c Waived     Other   Hyperlipidemia   Relevant Medications   amLODipine (NORVASC) 5 MG tablet   atorvastatin (LIPITOR) 20 MG tablet   Other Relevant Orders   Lipid panel   Other Visit Diagnoses       Physical exam    -  Primary   Relevant Orders   CBC with Differential/Platelet   CMP14+EGFR   Lipid panel   Bayer DCA Hb A1c Waived       Blood pressure slightly elevated, check blood pressure at home over the next couple weeks and avoid foods with added salt. Follow up plan: Return in about 6 months (around 06/06/2024), or if symptoms worsen or fail to improve, for A-fib and hypertension and hyperlipidemia.  Counseling provided for all of the vaccine components Orders Placed This Encounter  Procedures   CBC with Differential/Platelet   CMP14+EGFR   Lipid panel   Bayer DCA Hb A1c Waived    Arville Care, MD [facility] 12/05/2023, 8:21 AM

## 2023-12-06 ENCOUNTER — Encounter: Payer: Self-pay | Admitting: Family Medicine

## 2023-12-06 LAB — CBC WITH DIFFERENTIAL/PLATELET
Basophils Absolute: 0.1 10*3/uL (ref 0.0–0.2)
Basos: 1 %
EOS (ABSOLUTE): 0.1 10*3/uL (ref 0.0–0.4)
Eos: 2 %
Hematocrit: 40.9 % (ref 37.5–51.0)
Hemoglobin: 13.7 g/dL (ref 13.0–17.7)
Immature Grans (Abs): 0 10*3/uL (ref 0.0–0.1)
Immature Granulocytes: 0 %
Lymphocytes Absolute: 2.6 10*3/uL (ref 0.7–3.1)
Lymphs: 36 %
MCH: 30.4 pg (ref 26.6–33.0)
MCHC: 33.5 g/dL (ref 31.5–35.7)
MCV: 91 fL (ref 79–97)
Monocytes Absolute: 0.5 10*3/uL (ref 0.1–0.9)
Monocytes: 7 %
Neutrophils Absolute: 4 10*3/uL (ref 1.4–7.0)
Neutrophils: 54 %
Platelets: 257 10*3/uL (ref 150–450)
RBC: 4.51 x10E6/uL (ref 4.14–5.80)
RDW: 12.6 % (ref 11.6–15.4)
WBC: 7.2 10*3/uL (ref 3.4–10.8)

## 2023-12-06 LAB — CMP14+EGFR
ALT: 6 IU/L (ref 0–44)
AST: 13 IU/L (ref 0–40)
Albumin: 4.3 g/dL (ref 3.7–4.7)
Alkaline Phosphatase: 67 IU/L (ref 44–121)
BUN/Creatinine Ratio: 13 (ref 10–24)
BUN: 10 mg/dL (ref 8–27)
Bilirubin Total: 0.5 mg/dL (ref 0.0–1.2)
CO2: 24 mmol/L (ref 20–29)
Calcium: 9.5 mg/dL (ref 8.6–10.2)
Chloride: 100 mmol/L (ref 96–106)
Creatinine, Ser: 0.79 mg/dL (ref 0.76–1.27)
Globulin, Total: 2.1 g/dL (ref 1.5–4.5)
Glucose: 111 mg/dL — ABNORMAL HIGH (ref 70–99)
Potassium: 4.7 mmol/L (ref 3.5–5.2)
Sodium: 138 mmol/L (ref 134–144)
Total Protein: 6.4 g/dL (ref 6.0–8.5)
eGFR: 87 mL/min/{1.73_m2} (ref >59)

## 2023-12-06 LAB — LIPID PANEL
Cholesterol, Total: 223 mg/dL — ABNORMAL HIGH (ref 100–199)
HDL: 41 mg/dL (ref >39)
LDL CALC COMMENT:: 5.4 ratio — ABNORMAL HIGH (ref 0.0–5.0)
LDL Chol Calc (NIH): 161 mg/dL — ABNORMAL HIGH (ref 0–99)
Triglycerides: 117 mg/dL (ref 0–149)
VLDL Cholesterol Cal: 21 mg/dL (ref 5–40)

## 2023-12-21 ENCOUNTER — Telehealth: Payer: Self-pay

## 2023-12-21 NOTE — Telephone Encounter (Signed)
 Pt dropped off BP readings. Per Dr. Louanne Skye numbers look good. There were low diastolic readings but Dettinger states if pt does not feel light headed then the numbers are fine. Pt states that occasionally he feels light headed with standing. Advised pt to still keep an eye on it and if symptoms become worse to let the office know. Pt understood.

## 2024-01-07 DIAGNOSIS — Z85828 Personal history of other malignant neoplasm of skin: Secondary | ICD-10-CM | POA: Diagnosis not present

## 2024-01-07 DIAGNOSIS — Z08 Encounter for follow-up examination after completed treatment for malignant neoplasm: Secondary | ICD-10-CM | POA: Diagnosis not present

## 2024-01-07 DIAGNOSIS — L570 Actinic keratosis: Secondary | ICD-10-CM | POA: Diagnosis not present

## 2024-01-07 DIAGNOSIS — X32XXXD Exposure to sunlight, subsequent encounter: Secondary | ICD-10-CM | POA: Diagnosis not present

## 2024-06-05 ENCOUNTER — Emergency Department (HOSPITAL_COMMUNITY)

## 2024-06-05 ENCOUNTER — Other Ambulatory Visit: Payer: Self-pay

## 2024-06-05 ENCOUNTER — Encounter (HOSPITAL_COMMUNITY): Payer: Self-pay

## 2024-06-05 ENCOUNTER — Inpatient Hospital Stay (HOSPITAL_COMMUNITY)
Admission: EM | Admit: 2024-06-05 | Discharge: 2024-06-10 | DRG: 064 | Disposition: A | Discharge status: Rehabilitation Facility | Attending: Internal Medicine | Admitting: Internal Medicine

## 2024-06-05 DIAGNOSIS — I6340 Cerebral infarction due to embolism of unspecified cerebral artery: Secondary | ICD-10-CM | POA: Diagnosis not present

## 2024-06-05 DIAGNOSIS — Z7982 Long term (current) use of aspirin: Secondary | ICD-10-CM

## 2024-06-05 DIAGNOSIS — I480 Paroxysmal atrial fibrillation: Secondary | ICD-10-CM | POA: Diagnosis present

## 2024-06-05 DIAGNOSIS — E785 Hyperlipidemia, unspecified: Secondary | ICD-10-CM | POA: Diagnosis not present

## 2024-06-05 DIAGNOSIS — R29810 Facial weakness: Secondary | ICD-10-CM | POA: Diagnosis present

## 2024-06-05 DIAGNOSIS — R29703 NIHSS score 3: Secondary | ICD-10-CM | POA: Diagnosis present

## 2024-06-05 DIAGNOSIS — Z23 Encounter for immunization: Secondary | ICD-10-CM | POA: Diagnosis not present

## 2024-06-05 DIAGNOSIS — Z825 Family history of asthma and other chronic lower respiratory diseases: Secondary | ICD-10-CM

## 2024-06-05 DIAGNOSIS — H9190 Unspecified hearing loss, unspecified ear: Secondary | ICD-10-CM | POA: Diagnosis present

## 2024-06-05 DIAGNOSIS — D72829 Elevated white blood cell count, unspecified: Secondary | ICD-10-CM | POA: Diagnosis present

## 2024-06-05 DIAGNOSIS — I4510 Unspecified right bundle-branch block: Secondary | ICD-10-CM | POA: Diagnosis present

## 2024-06-05 DIAGNOSIS — S42009A Fracture of unspecified part of unspecified clavicle, initial encounter for closed fracture: Secondary | ICD-10-CM

## 2024-06-05 DIAGNOSIS — R270 Ataxia, unspecified: Secondary | ICD-10-CM | POA: Diagnosis present

## 2024-06-05 DIAGNOSIS — I2510 Atherosclerotic heart disease of native coronary artery without angina pectoris: Secondary | ICD-10-CM | POA: Diagnosis present

## 2024-06-05 DIAGNOSIS — I63411 Cerebral infarction due to embolism of right middle cerebral artery: Principal | ICD-10-CM | POA: Diagnosis present

## 2024-06-05 DIAGNOSIS — R41841 Cognitive communication deficit: Secondary | ICD-10-CM | POA: Diagnosis present

## 2024-06-05 DIAGNOSIS — W010XXA Fall on same level from slipping, tripping and stumbling without subsequent striking against object, initial encounter: Secondary | ICD-10-CM | POA: Diagnosis present

## 2024-06-05 DIAGNOSIS — Z602 Problems related to living alone: Secondary | ICD-10-CM | POA: Diagnosis present

## 2024-06-05 DIAGNOSIS — R748 Abnormal levels of other serum enzymes: Secondary | ICD-10-CM | POA: Diagnosis present

## 2024-06-05 DIAGNOSIS — I10 Essential (primary) hypertension: Secondary | ICD-10-CM | POA: Diagnosis present

## 2024-06-05 DIAGNOSIS — R471 Dysarthria and anarthria: Secondary | ICD-10-CM | POA: Diagnosis present

## 2024-06-05 DIAGNOSIS — M1990 Unspecified osteoarthritis, unspecified site: Secondary | ICD-10-CM | POA: Diagnosis not present

## 2024-06-05 DIAGNOSIS — Z8673 Personal history of transient ischemic attack (TIA), and cerebral infarction without residual deficits: Secondary | ICD-10-CM | POA: Diagnosis present

## 2024-06-05 DIAGNOSIS — I639 Cerebral infarction, unspecified: Secondary | ICD-10-CM | POA: Diagnosis present

## 2024-06-05 DIAGNOSIS — Y92019 Unspecified place in single-family (private) house as the place of occurrence of the external cause: Secondary | ICD-10-CM

## 2024-06-05 DIAGNOSIS — Z66 Do not resuscitate: Secondary | ICD-10-CM | POA: Diagnosis present

## 2024-06-05 DIAGNOSIS — Z79899 Other long term (current) drug therapy: Secondary | ICD-10-CM

## 2024-06-05 DIAGNOSIS — I4891 Unspecified atrial fibrillation: Secondary | ICD-10-CM | POA: Diagnosis not present

## 2024-06-05 DIAGNOSIS — M19012 Primary osteoarthritis, left shoulder: Secondary | ICD-10-CM | POA: Diagnosis not present

## 2024-06-05 DIAGNOSIS — S42035A Nondisplaced fracture of lateral end of left clavicle, initial encounter for closed fracture: Secondary | ICD-10-CM | POA: Diagnosis not present

## 2024-06-05 DIAGNOSIS — I618 Other nontraumatic intracerebral hemorrhage: Secondary | ICD-10-CM | POA: Diagnosis not present

## 2024-06-05 DIAGNOSIS — I6381 Other cerebral infarction due to occlusion or stenosis of small artery: Principal | ICD-10-CM

## 2024-06-05 DIAGNOSIS — Z880 Allergy status to penicillin: Secondary | ICD-10-CM

## 2024-06-05 DIAGNOSIS — E782 Mixed hyperlipidemia: Secondary | ICD-10-CM

## 2024-06-05 DIAGNOSIS — Z87891 Personal history of nicotine dependence: Secondary | ICD-10-CM

## 2024-06-05 DIAGNOSIS — Z1152 Encounter for screening for COVID-19: Secondary | ICD-10-CM

## 2024-06-05 DIAGNOSIS — Z85828 Personal history of other malignant neoplasm of skin: Secondary | ICD-10-CM

## 2024-06-05 DIAGNOSIS — Z7901 Long term (current) use of anticoagulants: Secondary | ICD-10-CM

## 2024-06-05 DIAGNOSIS — S199XXA Unspecified injury of neck, initial encounter: Secondary | ICD-10-CM | POA: Diagnosis not present

## 2024-06-05 DIAGNOSIS — S0990XA Unspecified injury of head, initial encounter: Secondary | ICD-10-CM | POA: Diagnosis not present

## 2024-06-05 DIAGNOSIS — N400 Enlarged prostate without lower urinary tract symptoms: Secondary | ICD-10-CM | POA: Diagnosis present

## 2024-06-05 DIAGNOSIS — F05 Delirium due to known physiological condition: Secondary | ICD-10-CM | POA: Diagnosis not present

## 2024-06-05 DIAGNOSIS — Z808 Family history of malignant neoplasm of other organs or systems: Secondary | ICD-10-CM

## 2024-06-05 LAB — CBC WITH DIFFERENTIAL/PLATELET
Abs Immature Granulocytes: 0.05 K/uL (ref 0.00–0.07)
Basophils Absolute: 0 K/uL (ref 0.0–0.1)
Basophils Relative: 0 %
Eosinophils Absolute: 0 K/uL (ref 0.0–0.5)
Eosinophils Relative: 0 %
HCT: 42.9 % (ref 39.0–52.0)
Hemoglobin: 14.5 g/dL (ref 13.0–17.0)
Immature Granulocytes: 0 %
Lymphocytes Relative: 19 %
Lymphs Abs: 2.5 K/uL (ref 0.7–4.0)
MCH: 30.3 pg (ref 26.0–34.0)
MCHC: 33.8 g/dL (ref 30.0–36.0)
MCV: 89.7 fL (ref 80.0–100.0)
Monocytes Absolute: 0.9 K/uL (ref 0.1–1.0)
Monocytes Relative: 7 %
Neutro Abs: 9.9 K/uL — ABNORMAL HIGH (ref 1.7–7.7)
Neutrophils Relative %: 74 %
Platelets: 272 K/uL (ref 150–400)
RBC: 4.78 MIL/uL (ref 4.22–5.81)
RDW: 13.1 % (ref 11.5–15.5)
WBC: 13.4 K/uL — ABNORMAL HIGH (ref 4.0–10.5)
nRBC: 0 % (ref 0.0–0.2)

## 2024-06-05 LAB — RESP PANEL BY RT-PCR (RSV, FLU A&B, COVID)  RVPGX2
Influenza A by PCR: NEGATIVE
Influenza B by PCR: NEGATIVE
Resp Syncytial Virus by PCR: NEGATIVE
SARS Coronavirus 2 by RT PCR: NEGATIVE

## 2024-06-05 LAB — URINALYSIS, ROUTINE W REFLEX MICROSCOPIC
Bilirubin Urine: NEGATIVE
Glucose, UA: NEGATIVE mg/dL
Hgb urine dipstick: NEGATIVE
Ketones, ur: 20 mg/dL — AB
Nitrite: NEGATIVE
Protein, ur: NEGATIVE mg/dL
Specific Gravity, Urine: 1.021 (ref 1.005–1.030)
pH: 5 (ref 5.0–8.0)

## 2024-06-05 LAB — COMPREHENSIVE METABOLIC PANEL WITH GFR
ALT: 23 U/L (ref 0–44)
AST: 53 U/L — ABNORMAL HIGH (ref 15–41)
Albumin: 4.2 g/dL (ref 3.5–5.0)
Alkaline Phosphatase: 45 U/L (ref 38–126)
Anion gap: 14 (ref 5–15)
BUN: 13 mg/dL (ref 8–23)
CO2: 24 mmol/L (ref 22–32)
Calcium: 9.4 mg/dL (ref 8.9–10.3)
Chloride: 99 mmol/L (ref 98–111)
Creatinine, Ser: 0.76 mg/dL (ref 0.61–1.24)
GFR, Estimated: >60 mL/min (ref >60)
Glucose, Bld: 112 mg/dL — ABNORMAL HIGH (ref 70–99)
Potassium: 3.9 mmol/L (ref 3.5–5.1)
Sodium: 137 mmol/L (ref 135–145)
Total Bilirubin: 1.8 mg/dL — ABNORMAL HIGH (ref 0.0–1.2)
Total Protein: 7.2 g/dL (ref 6.5–8.1)

## 2024-06-05 LAB — I-STAT CG4 LACTIC ACID, ED: Lactic Acid, Venous: 1.5 mmol/L (ref 0.5–1.9)

## 2024-06-05 MED ORDER — SODIUM CHLORIDE 0.9 % IV SOLN
1.0000 g | Freq: Once | INTRAVENOUS | Status: AC
  Administered 2024-06-05: 1 g via INTRAVENOUS
  Filled 2024-06-05: qty 10

## 2024-06-05 MED ORDER — AZITHROMYCIN 500 MG IV SOLR
500.0000 mg | Freq: Once | INTRAVENOUS | Status: AC
  Administered 2024-06-05: 500 mg via INTRAVENOUS
  Filled 2024-06-05: qty 5

## 2024-06-05 MED ORDER — IOHEXOL 350 MG/ML SOLN
65.0000 mL | Freq: Once | INTRAVENOUS | Status: AC | PRN
Start: 2024-06-05 — End: 2024-06-05
  Administered 2024-06-05: 65 mL via INTRAVENOUS

## 2024-06-05 NOTE — ED Provider Notes (Signed)
 [place] [facility]GENC[facility] DEPARTMENT AT [facility] Provider Note   CSN: [id number] Arrival date & time: 06/05/24  1739     Patient presents with: No chief complaint on file.   Kyle [name] is a 89 y.o. male.  Patient was brought in by EMS after a fall.  He mentioned falling today at 10 AM.  [place] he felt dizzy and might have blacked out after he fell.  Patient mentioned that his fall could have been due to his dizziness and also because he might have tripped on something.  He denied hitting his head and said that he used his wrists when he went down on the ground.  Patient denies any shortness of breath, fevers.  He complained about pain in the region right below his right ribs.  He is alert and oriented to self, place, year.  He he said that he lives with his wife at home and was calling her when he fell.  Also kept mentioning about recent cardiac surgery and stroke workup that he recently underwent.  Patient's daughter and granddaughter joined midway during evaluation.  Daughter mentioned that she always calls the patient every day and did not hear back from him yesterday.  She did that hear back from him today as well which is why she sent the patient's son to check up on him who found him on the floor.  Daughter mentioned that patient's room was completely disorganized.  She said that patient was a very active man who took care of himself, cooked, worked in the yard every day.  Daughter mentioned that patient's wife has been deceased for 2 years now and that he never talked about her like this. She mentioned that this is a significant change from his baseline which has been normal in the past.        Prior to Admission medications   Medication Sig Start Date End Date Taking? Authorizing Provider  amLODipine  (NORVASC ) 5 MG tablet Take 1 tablet (5 mg total) by mouth daily. 12/05/23   Dettinger, Fonda LABOR, MD  aspirin  81 MG tablet Take 81 mg by mouth daily.      [provider]   Aspirin -Acetaminophen -Caffeine (GOODYS EXTRA STRENGTH PO) Take by mouth as needed. Arthritis    [provider]  atorvastatin  (LIPITOR) 20 MG tablet Take 1 tablet (20 mg total) by mouth daily. 12/05/23   Dettinger, Fonda LABOR, MD  fish oil-omega-3 fatty acids  1000 MG capsule Take 1 capsule by mouth daily.      [provider]  Glucosamine-Chondroitin (OSTEO BI-FLEX REGULAR STRENGTH PO) Take 1 tablet by mouth daily.    [provider]    Allergies: Penicillins    Review of Systems  Reason unable to perform ROS: All pertinent ROS in HPI, MDM.    Updated Vital Signs Pulse 98   Resp (!) 32   Ht 5' 7 (1.702 m)   Wt 70.3 kg   SpO2 (!) 84%   BMI 24.27 kg/m   Physical Exam HENT:     Head: Normocephalic and atraumatic.  Neck:     Comments: Patient mentions some soreness in the neck Cardiovascular:     Rate and Rhythm: Normal rate and regular rhythm.     Comments: +2 PT pulse in RLE.  Not able to appreciate DP, PT in LLE Pulmonary:     Effort: Pulmonary effort is normal.     Breath sounds: Normal breath sounds.  Abdominal:     Palpations: Abdomen is  soft.     Tenderness: There is no abdominal tenderness. There is no guarding or rebound.  Musculoskeletal:     Cervical back: Normal range of motion.     Right lower leg: No edema.     Left lower leg: No edema.  Skin:    General: Skin is warm.  Neurological:     Mental Status: He is alert.     Comments: Diminished sensation in proximal LLE around the knee.  5/5 strength in bilateral lower extremities and right upper extremity.  Diminished strength in left upper extremity.  + Pronator test on left.  Negative finger-to-nose testing.  Patient had difficulty maintaining smooth tracking of the tip of my finger for EOM testing     (all labs ordered are listed, but only abnormal results are displayed) Labs Reviewed - No data to display  EKG: None  Radiology: No results found.   Procedures   None Medications Ordered in the ED - No data to display                                 Medical Decision Making Patient came in due to fall and AMS.  Differential diagnosis includes stroke, dehydration, infection, possible TBI.  Imaging was positive for acute/subacute right basal ganglia infarct and nondisplaced fracture of left distal clavicle.  Patient is outside the tPA window.  Neurology was consulted and recommended complete stroke workup along with MRI.  MRI was ordered.  Patient was given a sling for his left-sided clavicle fracture which will likely heal with time and does not require any procedure.  Disposition: Admission with further stroke workup  Amount and/or Complexity of Data Reviewed Labs: ordered.    Details: CBC, CMP, UA, I-stat lactic acid, resp-panel Radiology: ordered.    Details: CT head, neck, chest, abdomen, pelvis; x-ray left shoulder  Risk Prescription drug management.    Final diagnoses:  None    ED Discharge Orders     None          Edgardo Pontiff, DO 06/05/24 2245    Pamella Sharper A, DO 06/08/24 2000

## 2024-06-05 NOTE — ED Notes (Signed)
 Called and placed PT on monitor with CCMD

## 2024-06-05 NOTE — Consult Note (Signed)
 NEUROLOGY CONSULT NOTE   Date of service: June 05, 2024 Patient Name: Kyle [name] MRN:  [id number] DOB:  11-Mar-1935 Chief Complaint: R BG stroke Requesting Provider: Pamella Ozell LABOR, DO  History of Present Illness  Kyle [name] is a 89 y.o. male with hx of BPH, CAD, HLD, mitral regurg who was talking to his son yesterday AM and drove his tractor over to his son's home.  He was found him down and house was in disarray and he wasd brought in to the ED.   has a past medical history of Atrial fibrillation (HCC), Benign prostatic hypertrophy, CAD (coronary artery disease), Hyperlipidemia, Mitral regurgitation, and Skin cancer.   LKW: *** Modified rankin score: {Modified Rankin Scale:21264} IV Thrombolysis: ***Yes, *** No (reason) EVT: ***Yes, *** No (reason) ICH Score:***  NIHSS components Score: Comment  1a Level of Conscious 0[]  1[]  2[]  3[]      1b LOC Questions 0[]  1[]  2[]       1c LOC Commands 0[]  1[]  2[]       2 Best Gaze 0[]  1[]  2[]       3 Visual 0[]  1[]  2[]  3[]      4 Facial Palsy 0[]  1[]  2[]  3[]      5a Motor Arm - left 0[]  1[]  2[]  3[]  4[]  UN[]    5b Motor Arm - Right 0[]  1[]  2[]  3[]  4[]  UN[]    6a Motor Leg - Left 0[]  1[]  2[]  3[]  4[]  UN[]    6b Motor Leg - Right 0[]  1[]  2[]  3[]  4[]  UN[]    7 Limb Ataxia 0[]  1[]  2[]  UN[]      8 Sensory 0[]  1[]  2[]  UN[]      9 Best Language 0[]  1[]  2[]  3[]      10 Dysarthria 0[]  1[]  2[]  UN[]      11 Extinct. and Inattention 0[]  1[]  2[]       TOTAL:       ROS  ***Comprehensive ROS performed and pertinent positives documented in HPI  ***Unable to ascertain due to ***  Past History   Past Medical History:  Diagnosis Date   Atrial fibrillation [facility])    Post operative, maintaining normal sinus rhythm on amiodarone   Benign prostatic hypertrophy    CAD (coronary artery disease)    Non obstructive by catheterization Feb 2007 prior to his surgery revealing a 40-50% LAD lesion, good LV function with EF 55-60% by last echocardiogram prior to  his surgery   Hyperlipidemia    Treated   Mitral regurgitation    S/P mitral valve repair Oct 17, 2005 by Dr. Dusty   Skin cancer     Past Surgical History:  Procedure Laterality Date   CARDIAC CATHETERIZATION  10/2005   COLONOSCOPY     MITRAL VALVE REPAIR  10/17/2005   Dr. Dusty   POLYPECTOMY     SKIN CANCER EXCISION Left    cheek    Family History: Family History  Problem Relation Age of Onset   COPD Father    Cancer Brother        melanoma   Heart disease Neg Hx    Colon cancer Neg Hx     Social History  reports that he has quit smoking. His smoking use included cigarettes. He has never used smokeless tobacco. He reports that he does not drink alcohol and does not use drugs.  Allergies  Allergen Reactions   Penicillins     REACTION: dizziness/syncope    Medications   Current Facility-Administered Medications:    azithromycin  (ZITHROMAX ) 500 mg in sodium  chloride 0.9 % 250 mL IVPB, 500 mg, Intravenous, Once, Pamella Sharper A, DO   cefTRIAXone  (ROCEPHIN ) 1 g in sodium chloride  0.9 % 100 mL IVPB, 1 g, Intravenous, Once, Pamella Sharper LABOR, DO, Last Rate: 200 mL/hr at June 28, 2024 2146, 1 g at 28-Jun-2024 2146  Current Outpatient Medications:    amLODipine  (NORVASC ) 5 MG tablet, Take 1 tablet (5 mg total) by mouth daily., Disp: 90 tablet, Rfl: 3   aspirin  81 MG tablet, Take 81 mg by mouth daily.  , Disp: , Rfl:    Aspirin -Acetaminophen -Caffeine (GOODYS EXTRA STRENGTH PO), Take by mouth as needed. Arthritis, Disp: , Rfl:    atorvastatin  (LIPITOR) 20 MG tablet, Take 1 tablet (20 mg total) by mouth daily., Disp: 90 tablet, Rfl: 3   fish oil-omega-3 fatty acids  1000 MG capsule, Take 1 capsule by mouth daily.  , Disp: , Rfl:    Glucosamine-Chondroitin (OSTEO BI-FLEX REGULAR STRENGTH PO), Take 1 tablet by mouth daily., Disp: , Rfl:   Vitals   Vitals:   06-28-24 1825 06-28-24 1900 06/28/2024 1922 06-28-2024 2015  BP: (!) 134/110 116/87  127/74  Pulse: (!) 47 91  91  Resp: (!) 21 (!)  26  19  Temp:   98.6 F (37 C)   TempSrc:   Oral   SpO2: (!) 79% 100%  100%  Weight:      Height:        Body mass index is 24.27 kg/m.   Physical Exam   Constitutional: Appears well-developed and well-nourished. *** Psych: Affect appropriate to situation. *** Eyes: No scleral injection. *** HENT: No OP obstruction. *** Head: Normocephalic. *** Cardiovascular: Normal rate and regular rhythm. *** Respiratory: Effort normal, non-labored breathing. *** GI: Soft.  No distension. There is no tenderness. *** Skin: WDI. ***  Neurologic Examination   ***  Labs/Imaging/Neurodiagnostic studies   CBC:  Recent Labs  Lab Jun 28, 2024 1900  WBC 13.4*  NEUTROABS 9.9*  HGB 14.5  HCT 42.9  MCV 89.7  PLT 272   Basic Metabolic Panel:  Lab Results  Component Value Date   NA 137 06-28-2024   K 3.9 28-Jun-2024   CO2 24 2024-06-28   GLUCOSE 112 (H) 28-Jun-2024   BUN 13 2024-06-28   CREATININE 0.76 June 28, 2024   CALCIUM  9.4 Jun 28, 2024   GFRNONAA >60 June 28, 2024   GFRAA 98 05/21/2020   Lipid Panel:  Lab Results  Component Value Date   LDLCALC 161 (H) 12/05/2023   HgbA1c:  Lab Results  Component Value Date   HGBA1C 5.3 12/05/2023   Urine Drug Screen: No results found for: LABOPIA, COCAINSCRNUR, LABBENZ, AMPHETMU, THCU, LABBARB  Alcohol Level No results found for: ETH INR No results found for: INR APTT No results found for: APTT AED levels: No results found for: PHENYTOIN, ZONISAMIDE, LAMOTRIGINE, LEVETIRACETA  CT Head without contrast(Personally reviewed): ***  CT angio Head and Neck with contrast(Personally reviewed): ***  MR Angio head without contrast and Carotid Duplex BL(Personally reviewed): ***  MRI Brain(Personally reviewed): ***  Neurodiagnostics rEEG:  ***  ASSESSMENT   Kyle [name] is a 89 y.o. male ***  RECOMMENDATIONS  *** ______________________________________________________________________    Signed, Zairah Arista, MD [facility]

## 2024-06-05 NOTE — Progress Notes (Signed)
 Orthopedic Tech Progress Note Patient Details:  SAID RUEB Jun 05, 1935 [id number]  Ortho Devices Type of Ortho Device: Sling immobilizer Ortho Device/Splint Location: L CLAVICLE Ortho Device/Splint Interventions: Ordered, Application, Adjustment   Post Interventions Patient Tolerated: Well Instructions Provided: Care of device   Lysette Lindenbaum L Anette Barra 06/05/2024, 9:02 PM

## 2024-06-05 NOTE — ED Triage Notes (Signed)
 PT BIB [facility] from home with a c/o AMS.PT lives alone and had a fall today . PT states that he did not hit his head but does have a abrasion on his left shoulder . When family arrived,they noticed a AMS, L facial droop. LNK was 10am yesterday. Last oral intake was 10am today. EMS noted that PT got more confused on the way here.PT also has not taken his BP meds x 2weeks.Pupils are equal and reactive

## 2024-06-05 NOTE — ED Notes (Signed)
 Called ortho to place sling

## 2024-06-06 ENCOUNTER — Telehealth: Payer: Self-pay | Admitting: Family Medicine

## 2024-06-06 ENCOUNTER — Inpatient Hospital Stay (HOSPITAL_COMMUNITY)

## 2024-06-06 ENCOUNTER — Emergency Department (HOSPITAL_COMMUNITY)

## 2024-06-06 ENCOUNTER — Ambulatory Visit: Admitting: Family Medicine

## 2024-06-06 DIAGNOSIS — R29810 Facial weakness: Secondary | ICD-10-CM | POA: Diagnosis present

## 2024-06-06 DIAGNOSIS — R41841 Cognitive communication deficit: Secondary | ICD-10-CM | POA: Diagnosis present

## 2024-06-06 DIAGNOSIS — S42036S Nondisplaced fracture of lateral end of unspecified clavicle, sequela: Secondary | ICD-10-CM | POA: Diagnosis not present

## 2024-06-06 DIAGNOSIS — R233 Spontaneous ecchymoses: Secondary | ICD-10-CM | POA: Diagnosis not present

## 2024-06-06 DIAGNOSIS — M19012 Primary osteoarthritis, left shoulder: Secondary | ICD-10-CM | POA: Diagnosis not present

## 2024-06-06 DIAGNOSIS — I618 Other nontraumatic intracerebral hemorrhage: Secondary | ICD-10-CM | POA: Diagnosis present

## 2024-06-06 DIAGNOSIS — Z85828 Personal history of other malignant neoplasm of skin: Secondary | ICD-10-CM | POA: Diagnosis not present

## 2024-06-06 DIAGNOSIS — Z7982 Long term (current) use of aspirin: Secondary | ICD-10-CM | POA: Diagnosis not present

## 2024-06-06 DIAGNOSIS — R471 Dysarthria and anarthria: Secondary | ICD-10-CM | POA: Diagnosis present

## 2024-06-06 DIAGNOSIS — R29703 NIHSS score 3: Secondary | ICD-10-CM | POA: Diagnosis present

## 2024-06-06 DIAGNOSIS — I69392 Facial weakness following cerebral infarction: Secondary | ICD-10-CM | POA: Diagnosis not present

## 2024-06-06 DIAGNOSIS — R270 Ataxia, unspecified: Secondary | ICD-10-CM | POA: Diagnosis present

## 2024-06-06 DIAGNOSIS — H9190 Unspecified hearing loss, unspecified ear: Secondary | ICD-10-CM | POA: Diagnosis not present

## 2024-06-06 DIAGNOSIS — I4891 Unspecified atrial fibrillation: Secondary | ICD-10-CM | POA: Diagnosis not present

## 2024-06-06 DIAGNOSIS — Z880 Allergy status to penicillin: Secondary | ICD-10-CM | POA: Diagnosis not present

## 2024-06-06 DIAGNOSIS — E871 Hypo-osmolality and hyponatremia: Secondary | ICD-10-CM | POA: Diagnosis not present

## 2024-06-06 DIAGNOSIS — W19XXXA Unspecified fall, initial encounter: Secondary | ICD-10-CM | POA: Diagnosis not present

## 2024-06-06 DIAGNOSIS — I6381 Other cerebral infarction due to occlusion or stenosis of small artery: Secondary | ICD-10-CM | POA: Diagnosis not present

## 2024-06-06 DIAGNOSIS — R748 Abnormal levels of other serum enzymes: Secondary | ICD-10-CM | POA: Diagnosis present

## 2024-06-06 DIAGNOSIS — Z860100 Personal history of colon polyps, unspecified: Secondary | ICD-10-CM | POA: Diagnosis not present

## 2024-06-06 DIAGNOSIS — Z1152 Encounter for screening for COVID-19: Secondary | ICD-10-CM | POA: Diagnosis not present

## 2024-06-06 DIAGNOSIS — R29702 NIHSS score 2: Secondary | ICD-10-CM | POA: Diagnosis not present

## 2024-06-06 DIAGNOSIS — I6389 Other cerebral infarction: Secondary | ICD-10-CM | POA: Diagnosis not present

## 2024-06-06 DIAGNOSIS — S42019S Nondisplaced fracture of sternal end of unspecified clavicle, sequela: Secondary | ICD-10-CM | POA: Diagnosis not present

## 2024-06-06 DIAGNOSIS — Z8673 Personal history of transient ischemic attack (TIA), and cerebral infarction without residual deficits: Secondary | ICD-10-CM | POA: Diagnosis present

## 2024-06-06 DIAGNOSIS — R29818 Other symptoms and signs involving the nervous system: Secondary | ICD-10-CM | POA: Diagnosis not present

## 2024-06-06 DIAGNOSIS — Z79899 Other long term (current) drug therapy: Secondary | ICD-10-CM | POA: Diagnosis not present

## 2024-06-06 DIAGNOSIS — I6340 Cerebral infarction due to embolism of unspecified cerebral artery: Secondary | ICD-10-CM | POA: Diagnosis not present

## 2024-06-06 DIAGNOSIS — Z87891 Personal history of nicotine dependence: Secondary | ICD-10-CM | POA: Diagnosis not present

## 2024-06-06 DIAGNOSIS — S42035D Nondisplaced fracture of lateral end of left clavicle, subsequent encounter for fracture with routine healing: Secondary | ICD-10-CM | POA: Diagnosis not present

## 2024-06-06 DIAGNOSIS — Z602 Problems related to living alone: Secondary | ICD-10-CM | POA: Diagnosis present

## 2024-06-06 DIAGNOSIS — F05 Delirium due to known physiological condition: Secondary | ICD-10-CM | POA: Diagnosis not present

## 2024-06-06 DIAGNOSIS — E785 Hyperlipidemia, unspecified: Secondary | ICD-10-CM | POA: Diagnosis not present

## 2024-06-06 DIAGNOSIS — H510 Palsy (spasm) of conjugate gaze: Secondary | ICD-10-CM | POA: Diagnosis not present

## 2024-06-06 DIAGNOSIS — S42035A Nondisplaced fracture of lateral end of left clavicle, initial encounter for closed fracture: Secondary | ICD-10-CM | POA: Diagnosis not present

## 2024-06-06 DIAGNOSIS — Z808 Family history of malignant neoplasm of other organs or systems: Secondary | ICD-10-CM | POA: Diagnosis not present

## 2024-06-06 DIAGNOSIS — I4510 Unspecified right bundle-branch block: Secondary | ICD-10-CM | POA: Diagnosis present

## 2024-06-06 DIAGNOSIS — I69393 Ataxia following cerebral infarction: Secondary | ICD-10-CM | POA: Diagnosis not present

## 2024-06-06 DIAGNOSIS — I639 Cerebral infarction, unspecified: Secondary | ICD-10-CM

## 2024-06-06 DIAGNOSIS — E782 Mixed hyperlipidemia: Secondary | ICD-10-CM | POA: Diagnosis not present

## 2024-06-06 DIAGNOSIS — R9082 White matter disease, unspecified: Secondary | ICD-10-CM | POA: Diagnosis not present

## 2024-06-06 DIAGNOSIS — D696 Thrombocytopenia, unspecified: Secondary | ICD-10-CM | POA: Diagnosis not present

## 2024-06-06 DIAGNOSIS — Z23 Encounter for immunization: Secondary | ICD-10-CM | POA: Diagnosis not present

## 2024-06-06 DIAGNOSIS — Z66 Do not resuscitate: Secondary | ICD-10-CM | POA: Diagnosis present

## 2024-06-06 DIAGNOSIS — I6523 Occlusion and stenosis of bilateral carotid arteries: Secondary | ICD-10-CM | POA: Diagnosis not present

## 2024-06-06 DIAGNOSIS — W010XXA Fall on same level from slipping, tripping and stumbling without subsequent striking against object, initial encounter: Secondary | ICD-10-CM | POA: Diagnosis present

## 2024-06-06 DIAGNOSIS — I63411 Cerebral infarction due to embolism of right middle cerebral artery: Secondary | ICD-10-CM | POA: Diagnosis present

## 2024-06-06 DIAGNOSIS — S42009A Fracture of unspecified part of unspecified clavicle, initial encounter for closed fracture: Secondary | ICD-10-CM

## 2024-06-06 DIAGNOSIS — D649 Anemia, unspecified: Secondary | ICD-10-CM | POA: Diagnosis not present

## 2024-06-06 DIAGNOSIS — Z825 Family history of asthma and other chronic lower respiratory diseases: Secondary | ICD-10-CM | POA: Diagnosis not present

## 2024-06-06 DIAGNOSIS — N400 Enlarged prostate without lower urinary tract symptoms: Secondary | ICD-10-CM | POA: Diagnosis present

## 2024-06-06 DIAGNOSIS — I2510 Atherosclerotic heart disease of native coronary artery without angina pectoris: Secondary | ICD-10-CM | POA: Diagnosis not present

## 2024-06-06 DIAGNOSIS — Y92019 Unspecified place in single-family (private) house as the place of occurrence of the external cause: Secondary | ICD-10-CM | POA: Diagnosis not present

## 2024-06-06 DIAGNOSIS — I480 Paroxysmal atrial fibrillation: Secondary | ICD-10-CM | POA: Diagnosis not present

## 2024-06-06 DIAGNOSIS — Z952 Presence of prosthetic heart valve: Secondary | ICD-10-CM | POA: Diagnosis not present

## 2024-06-06 DIAGNOSIS — M1990 Unspecified osteoarthritis, unspecified site: Secondary | ICD-10-CM | POA: Diagnosis present

## 2024-06-06 DIAGNOSIS — D72829 Elevated white blood cell count, unspecified: Secondary | ICD-10-CM | POA: Diagnosis present

## 2024-06-06 DIAGNOSIS — I69354 Hemiplegia and hemiparesis following cerebral infarction affecting left non-dominant side: Secondary | ICD-10-CM | POA: Diagnosis not present

## 2024-06-06 DIAGNOSIS — I10 Essential (primary) hypertension: Secondary | ICD-10-CM | POA: Diagnosis not present

## 2024-06-06 LAB — LIPID PANEL
Cholesterol: 183 mg/dL (ref 0–200)
HDL: 43 mg/dL (ref >40)
LDL Cholesterol: 127 mg/dL — ABNORMAL HIGH (ref 0–99)
Total CHOL/HDL Ratio: 4.3 ratio
Triglycerides: 66 mg/dL (ref <150)
VLDL: 13 mg/dL (ref 0–40)

## 2024-06-06 LAB — COMPREHENSIVE METABOLIC PANEL WITH GFR
ALT: 22 U/L (ref 0–44)
AST: 34 U/L (ref 15–41)
Albumin: 3.5 g/dL (ref 3.5–5.0)
Alkaline Phosphatase: 40 U/L (ref 38–126)
Anion gap: 16 — ABNORMAL HIGH (ref 5–15)
BUN: 8 mg/dL (ref 8–23)
CO2: 19 mmol/L — ABNORMAL LOW (ref 22–32)
Calcium: 8.6 mg/dL — ABNORMAL LOW (ref 8.9–10.3)
Chloride: 103 mmol/L (ref 98–111)
Creatinine, Ser: 0.65 mg/dL (ref 0.61–1.24)
GFR, Estimated: >60 mL/min (ref >60)
Glucose, Bld: 97 mg/dL (ref 70–99)
Potassium: 3.6 mmol/L (ref 3.5–5.1)
Sodium: 138 mmol/L (ref 135–145)
Total Bilirubin: 1.3 mg/dL — ABNORMAL HIGH (ref 0.0–1.2)
Total Protein: 6.3 g/dL — ABNORMAL LOW (ref 6.5–8.1)

## 2024-06-06 LAB — CBC
HCT: 39.6 % (ref 39.0–52.0)
Hemoglobin: 13.4 g/dL (ref 13.0–17.0)
MCH: 31.1 pg (ref 26.0–34.0)
MCHC: 33.8 g/dL (ref 30.0–36.0)
MCV: 91.9 fL (ref 80.0–100.0)
Platelets: 102 K/uL — ABNORMAL LOW (ref 150–400)
RBC: 4.31 MIL/uL (ref 4.22–5.81)
RDW: 13.2 % (ref 11.5–15.5)
WBC: 9.1 K/uL (ref 4.0–10.5)
nRBC: 0 % (ref 0.0–0.2)

## 2024-06-06 LAB — ECHOCARDIOGRAM COMPLETE
Area-P 1/2: 2.21 cm2
Height: 67 in
S' Lateral: 3.5 cm
Weight: 2479.73 [oz_av]

## 2024-06-06 LAB — HEMOGLOBIN A1C
Hgb A1c MFr Bld: 5.4 % (ref 4.8–5.6)
Mean Plasma Glucose: 108.28 mg/dL

## 2024-06-06 LAB — TROPONIN I (HIGH SENSITIVITY): Troponin I (High Sensitivity): 24 ng/L — ABNORMAL HIGH (ref <18)

## 2024-06-06 MED ORDER — ACETAMINOPHEN 160 MG/5ML PO SOLN: 650.0000 mg | Freq: Four times a day (QID) | ORAL | Status: DC | PRN

## 2024-06-06 MED ORDER — OMEGA-3-ACID ETHYL ESTERS 1 G PO CAPS
1.0000 g | ORAL_CAPSULE | Freq: Two times a day (BID) | ORAL | Status: DC
  Administered 2024-06-06 – 2024-06-10 (×9): 1 g via ORAL
  Filled 2024-06-06 (×9): qty 1

## 2024-06-06 MED ORDER — GUAIFENESIN-DM 100-10 MG/5ML PO SYRP
15.0000 mL | ORAL_SOLUTION | ORAL | Status: DC | PRN
  Administered 2024-06-06 – 2024-06-10 (×5): 15 mL via ORAL
  Filled 2024-06-06 (×5): qty 15

## 2024-06-06 MED ORDER — ATORVASTATIN CALCIUM 80 MG PO TABS: 80.0000 mg | ORAL_TABLET | Freq: Every day | ORAL | Status: DC

## 2024-06-06 MED ORDER — ATORVASTATIN CALCIUM 40 MG PO TABS
40.0000 mg | ORAL_TABLET | Freq: Every day | ORAL | Status: DC
  Administered 2024-06-07 – 2024-06-10 (×4): 40 mg via ORAL
  Filled 2024-06-06 (×5): qty 1

## 2024-06-06 MED ORDER — ACETAMINOPHEN 325 MG PO TABS
650.0000 mg | ORAL_TABLET | Freq: Four times a day (QID) | ORAL | Status: DC | PRN
  Administered 2024-06-08: 650 mg via ORAL
  Filled 2024-06-06: qty 2

## 2024-06-06 MED ORDER — OMEGA-3 FATTY ACIDS 1000 MG PO CAPS: 1.0000 | ORAL_CAPSULE | Freq: Two times a day (BID) | ORAL | Status: DC

## 2024-06-06 MED ORDER — STROKE: EARLY STAGES OF RECOVERY BOOK
Freq: Once | Status: AC
  Filled 2024-06-06 (×2): qty 1

## 2024-06-06 MED ORDER — IOHEXOL 350 MG/ML SOLN
75.0000 mL | Freq: Once | INTRAVENOUS | Status: AC | PRN
  Administered 2024-06-06: 75 mL via INTRAVENOUS

## 2024-06-06 MED ORDER — ACETAMINOPHEN 650 MG RE SUPP: 650.0000 mg | Freq: Four times a day (QID) | RECTAL | Status: DC | PRN

## 2024-06-06 MED ORDER — ASPIRIN 81 MG PO CHEW
81.0000 mg | CHEWABLE_TABLET | Freq: Every day | ORAL | Status: DC
Start: 2024-06-06 — End: 2024-06-08
  Administered 2024-06-06 – 2024-06-07 (×2): 81 mg via ORAL
  Filled 2024-06-06 (×2): qty 1

## 2024-06-06 MED ORDER — ATORVASTATIN CALCIUM 10 MG PO TABS
20.0000 mg | ORAL_TABLET | Freq: Every day | ORAL | Status: DC
Start: 2024-06-06 — End: 2024-06-06
  Administered 2024-06-06: 20 mg via ORAL
  Filled 2024-06-06: qty 2

## 2024-06-06 NOTE — ED Notes (Signed)
 Floor notified patient coming up

## 2024-06-06 NOTE — ED Notes (Signed)
 Pt in MRI.

## 2024-06-06 NOTE — Progress Notes (Signed)
 PROGRESS NOTE    Kyle [name]  FMW:[id number] DOB: 1935/07/01 DOA: 06/05/2024 PCP: Dettinger, Fonda LABOR, MD   Brief Narrative:  HPI: Kyle [name] is a 89 y.o. male with medical history significant of mitral valve prolapse with moderate to severe MR status post mitral valve repair in 2006 complicated by postop A-fib which was managed with amiodarone as well as Coumadin at that time but he is no longer on anticoagulation.  History of CVA in 2007, CAD, hypertension, hyperlipidemia, BPH presented to the ED via EMS for evaluation of fall, left shoulder pain, altered mental status, and left facial droop.  LKW 10 AM on 06/04/2024.  Labs notable for WBC count 13.4, glucose 112, creatinine 0.7, AST 53 and T. bili 1.8 (sample hemolyzed), ALT and alk phos normal, UA not suggestive of infection, blood cultures in process, lactic acid normal, COVID/influenza/RSV PCR negative.  X-ray of left shoulder showing transverse nondisplaced fracture of the distal left clavicle.  Placed in sling immobilizer.  CT head showing acute to early subacute right basal ganglia infarction.  CTA head and neck negative for LVO.  Brain MRI showing acute infarct in the right basal ganglia with associated petechial hemorrhage.  Also showing small acute embolic infarcts in the right parietal cortex.  CT C-spine negative for acute findings.  CT chest/abdomen/pelvis with contrast negative for acute findings.  EKG showing atrial fibrillation and right bundle blanch block which appears new compared to previous EKG in the chart from January 2015.  Neurology consulting and requested admission for stroke workup.  [facility] called to admit.   Patient states he was at home yesterday walking to the kitchen when he felt dizzy, lost his balance and fell.  Denies head injury or loss of consciousness.  He does recall having heart palpitations about 30 minutes before this episode of dizziness and fall.  Denies chest pain.  Denies any pain in his clavicle or left  shoulder at this time.  Denies recent illness.  Reports having occasional cough related to allergies.  Denies fever, shortness of breath, vomiting, diarrhea, abdominal pain, or any urinary symptoms.  He takes aspirin  81 mg daily.  He reports history of A-fib after mitral valve repair surgery in 2006 and was treated with anticoagulation for 6 months at that time.  No longer on anticoagulation now.  Assessment & Plan:   Principal Problem:   Acute CVA (cerebrovascular accident) [facility]) Active Problems:   Hyperlipidemia   Atrial fibrillation (HCC)   Hypertension   Clavicle fracture  Acute ischemic CVA: In the setting of history of A-fib not on anticoagulation.  Brain MRI showing acute infarct in the right basal ganglia with associated petechial hemorrhage.  Also showing small acute embolic infarcts in the right parietal cortex.  CTA head and neck negative for LVO.  Admitting hospitalist discussed MRI findings with neurology, recommending starting aspirin  81 mg daily for now and will have to be started on anticoagulation for his A-fib in a few days.  Stroke team will follow.  Echocardiogram, lipid panel, and A1c ordered.  Frequent neurochecks per stroke unit protocol.  Telemetry monitoring, bedside swallow screen prior to p.o. intake, stroke education booklet, PT/OT/SLP consult.   Paroxysmal A-fib Currently rate controlled.  Not on anticoagulation.  CHA2DS2-VASc score 6.  Discussed with neurology and holding off starting anticoagulation at this time due to petechial hemorrhage on MRI.  He will have to be started on anticoagulation in a few days based on recommendation from stroke team.   Left distal  clavicle fracture In the setting of a fall prior to ED arrival. X-ray of left shoulder showing transverse nondisplaced fracture of the distal left clavicle.  Placed in sling immobilizer in the ED. Patient is not endorsing any pain at this time.  Will need close follow-up with orthopedics outpatient.   Mild  leukocytosis Likely reactive.  No obvious signs of infection.  Repeat CBC ordered..   Mild elevation of liver enzymes Sample hemolyzed and repeat labs ordered to confirm.   Hypertension Allow permissive hypertension at this time.   CAD EKG showing right bundle blanch block which appears new compared to previous EKG in the chart from over 10 years ago.  ACS less likely as patient is not endorsing chest pain.  Check troponin.  Continue aspirin  and Lipitor.   Hyperlipidemia Continue Lipitor and Lovaza .  DVT prophylaxis: SCD's Start: 06/06/24 0724   Code Status: Limited: Do not attempt resuscitation (DNR) -DNR-LIMITED -Do Not Intubate/DNI   Family Communication:  None present at bedside.  Plan of care discussed with patient in length and he/she verbalized understanding and agreed with it.  Status is: Inpatient Remains inpatient appropriate because: Needs repeat evaluation by neurology and to be seen by PT OT.   Estimated body mass index is 24.27 kg/m as calculated from the following:   Height as of this encounter: 5' 7 (1.702 m).   Weight as of this encounter: 70.3 kg.    Nutritional Assessment: Body mass index is 24.27 kg/m.SABRA Seen by dietician.  I agree with the assessment and plan as outlined below: Nutrition Status:        . Skin Assessment: I have examined the patient's skin and I agree with the wound assessment as performed by the wound care RN as outlined below:    Consultants:  Neurology  Procedures:  None  Antimicrobials:  Anti-infectives (From admission, onward)    Start     Dose/Rate Route Frequency Ordered Stop   06/05/24 1945  cefTRIAXone  (ROCEPHIN ) 1 g in sodium chloride  0.9 % 100 mL IVPB        1 g 200 mL/hr over 30 Minutes Intravenous  Once 06/05/24 1941 06/05/24 2248   06/05/24 1945  azithromycin  (ZITHROMAX ) 500 mg in sodium chloride  0.9 % 250 mL IVPB        500 mg 250 mL/hr over 60 Minutes Intravenous  Once 06/05/24 1941 06/06/24 0257          Subjective: Patient seen and examined the ED, primary RN at the bedside.  Patient fully alert and oriented.  He has no complaint other than left clavicular pain.  He had left upper extremity sling.  Objective: Vitals:   06/06/24 0359 06/06/24 0400 06/06/24 0615 06/06/24 0745  BP:  (!) 164/134  (!) 151/133  Pulse:  98  (!) 50  Resp:  (!) 21  (!) 30  Temp: 98 F (36.7 C)     TempSrc: Axillary     SpO2:  (!) 88% 97% 98%  Weight:      Height:        Intake/Output Summary (Last 24 hours) at 06/06/2024 0908 Last data filed at 06/06/2024 0257 Gross per 24 hour  Intake 350 ml  Output --  Net 350 ml   Filed Weights   06/05/24 1746  Weight: 70.3 kg    Examination:  General exam: Appears calm and comfortable, left upper extremity sling Respiratory system: Clear to auscultation. Respiratory effort normal. Cardiovascular system: S1 & S2 heard, RRR. No JVD, murmurs, rubs,  gallops or clicks. No pedal edema. Gastrointestinal system: Abdomen is nondistended, soft and nontender. No organomegaly or masses felt. Normal bowel sounds heard. Central nervous system: Alert and oriented. No focal neurological deficits.  Mild left facial droop. Extremities: Symmetric 5 x 5 power. Skin: No rashes, lesions or ulcers Psychiatry: Judgement and insight appear normal. Mood & affect appropriate.    Data Reviewed: I have personally reviewed following labs and imaging studies  CBC: Recent Labs  Lab 06/05/24 1900  WBC 13.4*  NEUTROABS 9.9*  HGB 14.5  HCT 42.9  MCV 89.7  PLT 272   Basic Metabolic Panel: Recent Labs  Lab 06/05/24 1900  NA 137  K 3.9  CL 99  CO2 24  GLUCOSE 112*  BUN 13  CREATININE 0.76  CALCIUM  9.4   GFR: Estimated Creatinine Clearance: 60.8 mL/min (by C-G formula based on SCr of 0.76 mg/dL). Liver Function Tests: Recent Labs  Lab 06/05/24 1900  AST 53*  ALT 23  ALKPHOS 45  BILITOT 1.8*  PROT 7.2  ALBUMIN 4.2   No results for input(s): LIPASE, AMYLASE  in the last 168 hours. No results for input(s): AMMONIA in the last 168 hours. Coagulation Profile: No results for input(s): INR, PROTIME in the last 168 hours. Cardiac Enzymes: No results for input(s): CKTOTAL, CKMB, CKMBINDEX, TROPONINI in the last 168 hours. BNP (last 3 results) No results for input(s): PROBNP in the last 8760 hours. HbA1C: No results for input(s): HGBA1C in the last 72 hours. CBG: No results for input(s): GLUCAP in the last 168 hours. Lipid Profile: No results for input(s): CHOL, HDL, LDLCALC, TRIG, CHOLHDL, LDLDIRECT in the last 72 hours. Thyroid Function Tests: No results for input(s): TSH, T4TOTAL, FREET4, T3FREE, THYROIDAB in the last 72 hours. Anemia Panel: No results for input(s): VITAMINB12, FOLATE, FERRITIN, TIBC, IRON, RETICCTPCT in the last 72 hours. Sepsis Labs: Recent Labs  Lab 06/05/24 1907  LATICACIDVEN 1.5    Recent Results (from the past 240 hours)  Culture, blood (routine x 2)     Status: None (Preliminary result)   Collection Time: 06/05/24  7:00 PM   Specimen: BLOOD  Result Value Ref Range Status   Specimen Description BLOOD LEFT ANTECUBITAL  Final   Special Requests   Final    BOTTLES DRAWN AEROBIC ONLY Blood Culture adequate volume   Culture   Final    NO GROWTH < 12 HOURS Performed at [facility], [address], [place], [place] [address]    Report Status PENDING  Incomplete  Resp panel by RT-PCR (RSV, Flu A&B, Covid) Anterior Nasal Swab     Status: None   Collection Time: 06/05/24  7:43 PM   Specimen: Anterior Nasal Swab  Result Value Ref Range Status   SARS Coronavirus 2 by RT PCR NEGATIVE NEGATIVE Final   Influenza A by PCR NEGATIVE NEGATIVE Final   Influenza B by PCR NEGATIVE NEGATIVE Final    Comment: (NOTE) The Xpert Xpress SARS-CoV-2/FLU/RSV plus assay is intended as an aid in the diagnosis of influenza from Nasopharyngeal swab specimens and should not be  used as a sole basis for treatment. Nasal washings and aspirates are unacceptable for Xpert Xpress SARS-CoV-2/FLU/RSV testing.  Fact Sheet for Patients: BloggerCourse.com  Fact Sheet for Healthcare Providers: SeriousBroker.it  This test is not yet approved or cleared by the [place]  FDA and has been authorized for detection and/or diagnosis of SARS-CoV-2 by FDA under an Emergency Use Authorization (EUA). This EUA will remain in effect (meaning  this test can be used) for the duration of the COVID-19 declaration under Section 564(b)(1) of the Act, 21 U.S.C. section 360bbb-3(b)(1), unless the authorization is terminated or revoked.     Resp Syncytial Virus by PCR NEGATIVE NEGATIVE Final    Comment: (NOTE) Fact Sheet for Patients: BloggerCourse.com  Fact Sheet for Healthcare Providers: SeriousBroker.it  This test is not yet approved or cleared by the [place]  FDA and has been authorized for detection and/or diagnosis of SARS-CoV-2 by FDA under an Emergency Use Authorization (EUA). This EUA will remain in effect (meaning this test can be used) for the duration of the COVID-19 declaration under Section 564(b)(1) of the Act, 21 U.S.C. section 360bbb-3(b)(1), unless the authorization is terminated or revoked.  Performed at [facility], [address] [place], [place] [address]      Radiology Studies: MR BRAIN WO CONTRAST Result Date: 06/06/2024 EXAM: MRI BRAIN WITHOUT CONTRAST 06/06/2024 05:21:59 AM TECHNIQUE: Multiplanar multisequence MRI of the head/brain was performed without the administration of intravenous contrast. COMPARISON: CT of the head dated 06/05/2024 and MRI of the head dated 11/10/2005. CLINICAL HISTORY: Neuro deficit, acute, stroke suspected. FINDINGS: BRAIN AND VENTRICLES: Restricted diffusion present within the right basal ganglia compatible with  acute nonhemorrhagic infarct. Restricted diffusion present within the right parietal lobe cortex compatible with small embolic infarcts. Blooming artifact present within the right basal ganglia, suggesting petechial hemorrhage. Focus of hemosiderin staining present laterally within the left cerebellar hemisphere. Moderate periventricular white matter disease. No mass. No midline shift. No hydrocephalus. No significant cerebral swelling or mass effect. The sella is unremarkable. Normal flow voids. ORBITS: No acute abnormality. SINUSES AND MASTOIDS: No acute abnormality. BONES AND SOFT TISSUES: Normal marrow signal. No acute soft tissue abnormality. IMPRESSION: 1. Acute infarct in the right basal ganglia with associated petechial hemorrhage. 2. Small acute embolic infarcts in the right parietal cortex. 3. No significant cerebral swelling or mass effect. 4. Moderate chronic periventricular white matter disease. Electronically signed by: Evalene Coho MD 06/06/2024 05:59 AM EDT RP Workstation: [id number]   CT ANGIO HEAD NECK W WO CM Result Date: 06/06/2024 EXAM: CTA HEAD AND NECK WITH AND WITHOUT 06/06/2024 04:41:43 AM TECHNIQUE: CTA of the head and neck was performed with and without the administration of 75 mL iohexol  (OMNIPAQUE ) 350 MG/ML injection. Multiplanar 2D and/or 3D reformatted images are provided for review. Automated exposure control, iterative reconstruction, and/or weight based adjustment of the mA/kV was utilized to reduce the radiation dose to as low as reasonably achievable. Stenosis of the internal carotid arteries measured using NASCET criteria. COMPARISON: None available CLINICAL HISTORY: Neuro deficit, acute, stroke suspected. FINDINGS: CTA NECK: AORTIC ARCH AND ARCH VESSELS: There is mild-to-moderate calcific atheromatous disease within the aortic arch. No dissection or arterial injury. No significant stenosis of the brachiocephalic or subclavian arteries. CERVICAL CAROTID ARTERIES: There is  calcific atheromatous disease within the carotid bulbs bilaterally, with less than 20% luminal stenosis bilaterally. No dissection or arterial injury. CERVICAL VERTEBRAL ARTERIES: The vertebral arteries are codominant and normal in caliber throughout the respective courses. No dissection, arterial injury, or significant stenosis. LUNGS AND MEDIASTINUM: Unremarkable. SOFT TISSUES: No acute abnormality. BONES: No acute abnormality. CTA HEAD: ANTERIOR CIRCULATION: No significant stenosis of the internal carotid arteries. No significant stenosis of the anterior cerebral arteries. No significant stenosis of the middle cerebral arteries. No aneurysm. POSTERIOR CIRCULATION: There is fetal type origin of the posterior cerebral arteries bilaterally. No significant stenosis of the posterior cerebral arteries. No significant stenosis of  the basilar artery. No significant stenosis of the vertebral arteries. No aneurysm. OTHER: No dural venous sinus thrombosis on this non-dedicated study. IMPRESSION: 1. No large vessel occlusion or aneurysm in the head or neck. 2. Bilateral carotid bulb atherosclerosis with less than 20% luminal stenosis. 3. Fetal-type origin of both posterior cerebral arteries (anatomic variant). Electronically signed by: Evalene Coho MD 06/06/2024 05:54 AM EDT RP Workstation: [id number]   CT Head Wo Contrast Addendum Date: 06/05/2024 ADDENDUM REPORT: 06/05/2024 21:46 ADDENDUM: These results were called by telephone at the time of interpretation on 06/05/2024 at 9:46 pm to provider [facility] , who verbally acknowledged these results. Electronically Signed   By: Morgane  Naveau [name]   On: 06/05/2024 21:46   Result Date: 06/05/2024 CLINICAL DATA:  Head trauma, minor (Age >= 65y); Neck trauma (Age >= 65y). Altered mental status. EXAM: CT HEAD WITHOUT CONTRAST CT CERVICAL SPINE WITHOUT CONTRAST TECHNIQUE: Multidetector CT imaging of the head and cervical spine was performed following the standard  protocol without intravenous contrast. Multiplanar CT image reconstructions of the cervical spine were also generated. RADIATION DOSE REDUCTION: This exam was performed according to the departmental dose-optimization program which includes automated exposure control, adjustment of the mA and/or kV according to patient size and/or use of iterative reconstruction technique. COMPARISON:  None Available. FINDINGS: CT HEAD FINDINGS Brain: Cerebral ventricle sizes are concordant with the degree of cerebral volume loss. Patchy and confluent areas of decreased attenuation are noted throughout the deep and periventricular white matter of the cerebral hemispheres bilaterally, compatible with chronic microvascular ischemic disease. Loss of gray-white matter differentiation of the right basal ganglia with associated vasogenic edema and mild mass effect. No parenchymal hemorrhage. Few scattered 1-3 mm cerebral and cerebellar calcifications. No mass lesion. No extra-axial collection. No midline shift. No hydrocephalus. Basilar cisterns are patent. Vascular: No hyperdense vessel. Skull: No acute fracture or focal lesion. Sinuses/Orbits: Paranasal sinuses and mastoid air cells are clear. The. Otherwise the orbits are unremarkable. Other: None. CT CERVICAL SPINE FINDINGS Alignment: Normal. Skull base and vertebrae: Diffusely decreased bone density. Multilevel mild degenerative changes of the spine. No acute fracture. No aggressive appearing focal osseous lesion or focal pathologic process. Soft tissues and spinal canal: No prevertebral fluid or swelling. No visible canal hematoma. Upper chest: A paraseptal and centrilobular emphysematous changes. Other: Atherosclerotic plaque of the aortic arch and its main branches. Atherosclerotic plaque of the carotid arteries within the neck. IMPRESSION: 1. Acute to early subacute right basal ganglia infarction. 2. No acute intracranial hemorrhage. 3. No acute displaced fracture or traumatic  listhesis of the cervical spine. 4. Aortic Atherosclerosis (ICD10-I70.0) and Emphysema (ICD10-J43.9). Electronically Signed: By: Morgane  Naveau [name] On: 06/05/2024 21:42   CT Cervical Spine Wo Contrast Addendum Date: 06/05/2024 ADDENDUM REPORT: 06/05/2024 21:46 ADDENDUM: These results were called by telephone at the time of interpretation on 06/05/2024 at 9:46 pm to provider [facility] , who verbally acknowledged these results. Electronically Signed   By: Morgane  Naveau [name]   On: 06/05/2024 21:46   Result Date: 06/05/2024 CLINICAL DATA:  Head trauma, minor (Age >= 65y); Neck trauma (Age >= 65y). Altered mental status. EXAM: CT HEAD WITHOUT CONTRAST CT CERVICAL SPINE WITHOUT CONTRAST TECHNIQUE: Multidetector CT imaging of the head and cervical spine was performed following the standard protocol without intravenous contrast. Multiplanar CT image reconstructions of the cervical spine were also generated. RADIATION DOSE REDUCTION: This exam was performed according to the departmental dose-optimization program which includes automated exposure control, adjustment of the mA  and/or kV according to patient size and/or use of iterative reconstruction technique. COMPARISON:  None Available. FINDINGS: CT HEAD FINDINGS Brain: Cerebral ventricle sizes are concordant with the degree of cerebral volume loss. Patchy and confluent areas of decreased attenuation are noted throughout the deep and periventricular white matter of the cerebral hemispheres bilaterally, compatible with chronic microvascular ischemic disease. Loss of gray-white matter differentiation of the right basal ganglia with associated vasogenic edema and mild mass effect. No parenchymal hemorrhage. Few scattered 1-3 mm cerebral and cerebellar calcifications. No mass lesion. No extra-axial collection. No midline shift. No hydrocephalus. Basilar cisterns are patent. Vascular: No hyperdense vessel. Skull: No acute fracture or focal lesion. Sinuses/Orbits:  Paranasal sinuses and mastoid air cells are clear. The. Otherwise the orbits are unremarkable. Other: None. CT CERVICAL SPINE FINDINGS Alignment: Normal. Skull base and vertebrae: Diffusely decreased bone density. Multilevel mild degenerative changes of the spine. No acute fracture. No aggressive appearing focal osseous lesion or focal pathologic process. Soft tissues and spinal canal: No prevertebral fluid or swelling. No visible canal hematoma. Upper chest: A paraseptal and centrilobular emphysematous changes. Other: Atherosclerotic plaque of the aortic arch and its main branches. Atherosclerotic plaque of the carotid arteries within the neck. IMPRESSION: 1. Acute to early subacute right basal ganglia infarction. 2. No acute intracranial hemorrhage. 3. No acute displaced fracture or traumatic listhesis of the cervical spine. 4. Aortic Atherosclerosis (ICD10-I70.0) and Emphysema (ICD10-J43.9). Electronically Signed: By: Morgane  Naveau [name] On: 06/05/2024 21:42   CT CHEST ABDOMEN PELVIS W CONTRAST Result Date: 06/05/2024 CLINICAL DATA:  Polytrauma, blunt AMS, ?sepsis. question sepsis. EXAM: CT CHEST, ABDOMEN, AND PELVIS WITH CONTRAST TECHNIQUE: Multidetector CT imaging of the chest, abdomen and pelvis was performed following the standard protocol during bolus administration of intravenous contrast. RADIATION DOSE REDUCTION: This exam was performed according to the departmental dose-optimization program which includes automated exposure control, adjustment of the mA and/or kV according to patient size and/or use of iterative reconstruction technique. CONTRAST:  65mL OMNIPAQUE  IOHEXOL  350 MG/ML SOLN COMPARISON:  11/10/2005 FINDINGS: CT CHEST FINDINGS Cardiovascular: Heart is borderline in size. Aorta normal caliber. Aortic atherosclerosis. Mediastinum/Nodes: No mediastinal, hilar, or axillary adenopathy. Trachea and esophagus are unremarkable. Thyroid unremarkable. Lungs/Pleura: No confluent airspace opacities or  effusions. No pneumothorax. Musculoskeletal: Chest wall soft tissues are unremarkable. No acute bony abnormality. CT ABDOMEN PELVIS FINDINGS Hepatobiliary: No focal hepatic abnormality. Gallbladder unremarkable. Pancreas: No focal abnormality or ductal dilatation. Spleen: No focal abnormality.  Normal size. Adrenals/Urinary Tract: Stable left adrenal nodule compatible with adenoma. Right adrenal gland normal. 5.3 cm cyst in the upper pole of the right kidney appears simple. No follow-up imaging recommended. No hydronephrosis. Stomach/Bowel: Left colonic diverticulosis. No active diverticulitis. Stomach and small bowel decompressed. No bowel obstruction or inflammatory process. Vascular/Lymphatic: Aortic atherosclerosis. No evidence of aneurysm or adenopathy. Reproductive: No visible focal abnormality. Other: No free fluid or free air. Musculoskeletal: No acute bony abnormality. IMPRESSION: No acute findings or significant traumatic injury in the chest, abdomen or pelvis. Aortic atherosclerosis. Left colonic diverticulosis. Electronically Signed   By: Franky Crease [name]   On: 06/05/2024 21:40   DG Shoulder Left Result Date: 06/05/2024 CLINICAL DATA:  Left shoulder pain after a fall.  Abrasions. EXAM: DG SHOULDER 2+V*L* COMPARISON:  None Available. FINDINGS: Transverse mildly comminuted fracture of the distal left clavicle. Fracture does not appear to involve the acromioclavicular joint. No significant displacement. The glenohumeral joint appears intact without additional fracture or dislocation. Degenerative changes in the glenohumeral and acromioclavicular joints. No  focal bone lesions identified. Soft tissues are unremarkable. IMPRESSION: Transverse nondisplaced fracture of the distal left clavicle. Electronically Signed   By: Elsie Gravely [name]   On: 06/05/2024 19:31    Scheduled Meds:  [START ON 06/07/2024]  stroke: early stages of recovery book   Does not apply Once   aspirin   81 mg Oral Daily    atorvastatin   20 mg Oral Daily   omega-3 acid ethyl esters  1 g Oral BID   Continuous Infusions:   LOS: 0 days   Fredia Skeeter, MD [facility] Total time spent 41 minutes 06/06/2024, 9:08 AM   *Please note that this is a verbal dictation therefore any spelling or grammatical errors are due to the Dragon Medical One system interpretation.  Please page via Amion and do not message via secure chat for urgent patient care matters. Secure chat can be used for non urgent patient care matters.  How to contact the [facility] Attending or Consulting provider 7A - 7P or covering provider during after hours 7P -7A, for this patient?  Check the care team in [facility] and look for a) attending/consulting [facility] provider listed and b) the [facility] team listed. Page or secure chat 7A-7P. Log into [email or web address] and use [employer]'s universal password to access. If you do not have the password, please contact the hospital operator. Locate the [facility] provider you are looking for under [facility] and page to a number that you can be directly reached. If you still have difficulty reaching the provider, please page the [facility] (Director on Call) for the Hospitalists listed on amion for assistance.

## 2024-06-06 NOTE — Progress Notes (Addendum)
 STROKE TEAM PROGRESS NOTE    INTERIM HISTORY/SUBJECTIVE Patient presented with a fall and inability to get up and reported weakness in legs.  MRI shows acute basal ganglia infarct on the right as well as small acute right parietal cortex.  CT angiogram shows no large vessel stenosis or occlusion.  Patient remains in atrial fibrillation.  He has been hemodynamically stable and afebrile overnight.  OBJECTIVE  CBC    Component Value Date/Time   WBC 9.1 06/06/2024 0907   RBC 4.31 06/06/2024 0907   HGB 13.4 06/06/2024 0907   HGB 13.7 12/05/2023 0827   HCT 39.6 06/06/2024 0907   HCT 40.9 12/05/2023 0827   PLT 102 (L) 06/06/2024 0907   PLT 257 12/05/2023 0827   MCV 91.9 06/06/2024 0907   MCV 91 12/05/2023 0827   MCH 31.1 06/06/2024 0907   MCHC 33.8 06/06/2024 0907   RDW 13.2 06/06/2024 0907   RDW 12.6 12/05/2023 0827   LYMPHSABS 2.5 06/05/2024 1900   LYMPHSABS 2.6 12/05/2023 0827   MONOABS 0.9 06/05/2024 1900   EOSABS 0.0 06/05/2024 1900   EOSABS 0.1 12/05/2023 0827   BASOSABS 0.0 06/05/2024 1900   BASOSABS 0.1 12/05/2023 0827    BMET    Component Value Date/Time   NA 138 06/06/2024 0907   NA 138 12/05/2023 0827   K 3.6 06/06/2024 0907   CL 103 06/06/2024 0907   CO2 19 (L) 06/06/2024 0907   GLUCOSE 97 06/06/2024 0907   BUN 8 06/06/2024 0907   BUN 10 12/05/2023 0827   CREATININE 0.65 06/06/2024 0907   CREATININE 0.81 02/17/2013 1230   CALCIUM  8.6 (L) 06/06/2024 0907   EGFR 87 12/05/2023 0827   GFRNONAA >60 06/06/2024 0907   GFRNONAA 86 02/17/2013 1230    IMAGING past 24 hours MR BRAIN WO CONTRAST Result Date: 06/06/2024 EXAM: MRI BRAIN WITHOUT CONTRAST 06/06/2024 05:21:59 AM TECHNIQUE: Multiplanar multisequence MRI of the head/brain was performed without the administration of intravenous contrast. COMPARISON: CT of the head dated 06/05/2024 and MRI of the head dated 11/10/2005. CLINICAL HISTORY: Neuro deficit, acute, stroke suspected. FINDINGS: BRAIN AND VENTRICLES:  Restricted diffusion present within the right basal ganglia compatible with acute nonhemorrhagic infarct. Restricted diffusion present within the right parietal lobe cortex compatible with small embolic infarcts. Blooming artifact present within the right basal ganglia, suggesting petechial hemorrhage. Focus of hemosiderin staining present laterally within the left cerebellar hemisphere. Moderate periventricular white matter disease. No mass. No midline shift. No hydrocephalus. No significant cerebral swelling or mass effect. The sella is unremarkable. Normal flow voids. ORBITS: No acute abnormality. SINUSES AND MASTOIDS: No acute abnormality. BONES AND SOFT TISSUES: Normal marrow signal. No acute soft tissue abnormality. IMPRESSION: 1. Acute infarct in the right basal ganglia with associated petechial hemorrhage. 2. Small acute embolic infarcts in the right parietal cortex. 3. No significant cerebral swelling or mass effect. 4. Moderate chronic periventricular white matter disease. Electronically signed by: Evalene Coho MD 06/06/2024 05:59 AM EDT RP Workstation: [id number]   CT ANGIO HEAD NECK W WO CM Result Date: 06/06/2024 EXAM: CTA HEAD AND NECK WITH AND WITHOUT 06/06/2024 04:41:43 AM TECHNIQUE: CTA of the head and neck was performed with and without the administration of 75 mL iohexol  (OMNIPAQUE ) 350 MG/ML injection. Multiplanar 2D and/or 3D reformatted images are provided for review. Automated exposure control, iterative reconstruction, and/or weight based adjustment of the mA/kV was utilized to reduce the radiation dose to as low as reasonably achievable. Stenosis of the internal carotid arteries measured using NASCET  criteria. COMPARISON: None available CLINICAL HISTORY: Neuro deficit, acute, stroke suspected. FINDINGS: CTA NECK: AORTIC ARCH AND ARCH VESSELS: There is mild-to-moderate calcific atheromatous disease within the aortic arch. No dissection or arterial injury. No significant stenosis of the  brachiocephalic or subclavian arteries. CERVICAL CAROTID ARTERIES: There is calcific atheromatous disease within the carotid bulbs bilaterally, with less than 20% luminal stenosis bilaterally. No dissection or arterial injury. CERVICAL VERTEBRAL ARTERIES: The vertebral arteries are codominant and normal in caliber throughout the respective courses. No dissection, arterial injury, or significant stenosis. LUNGS AND MEDIASTINUM: Unremarkable. SOFT TISSUES: No acute abnormality. BONES: No acute abnormality. CTA HEAD: ANTERIOR CIRCULATION: No significant stenosis of the internal carotid arteries. No significant stenosis of the anterior cerebral arteries. No significant stenosis of the middle cerebral arteries. No aneurysm. POSTERIOR CIRCULATION: There is fetal type origin of the posterior cerebral arteries bilaterally. No significant stenosis of the posterior cerebral arteries. No significant stenosis of the basilar artery. No significant stenosis of the vertebral arteries. No aneurysm. OTHER: No dural venous sinus thrombosis on this non-dedicated study. IMPRESSION: 1. No large vessel occlusion or aneurysm in the head or neck. 2. Bilateral carotid bulb atherosclerosis with less than 20% luminal stenosis. 3. Fetal-type origin of both posterior cerebral arteries (anatomic variant). Electronically signed by: Evalene Coho MD 06/06/2024 05:54 AM EDT RP Workstation: [id number]   CT Head Wo Contrast Addendum Date: 06/05/2024 ADDENDUM REPORT: 06/05/2024 21:46 ADDENDUM: These results were called by telephone at the time of interpretation on 06/05/2024 at 9:46 pm to provider [facility] , who verbally acknowledged these results. Electronically Signed   By: Morgane  Naveau [name]   On: 06/05/2024 21:46   Result Date: 06/05/2024 CLINICAL DATA:  Head trauma, minor (Age >= 65y); Neck trauma (Age >= 65y). Altered mental status. EXAM: CT HEAD WITHOUT CONTRAST CT CERVICAL SPINE WITHOUT CONTRAST TECHNIQUE: Multidetector CT  imaging of the head and cervical spine was performed following the standard protocol without intravenous contrast. Multiplanar CT image reconstructions of the cervical spine were also generated. RADIATION DOSE REDUCTION: This exam was performed according to the departmental dose-optimization program which includes automated exposure control, adjustment of the mA and/or kV according to patient size and/or use of iterative reconstruction technique. COMPARISON:  None Available. FINDINGS: CT HEAD FINDINGS Brain: Cerebral ventricle sizes are concordant with the degree of cerebral volume loss. Patchy and confluent areas of decreased attenuation are noted throughout the deep and periventricular white matter of the cerebral hemispheres bilaterally, compatible with chronic microvascular ischemic disease. Loss of gray-white matter differentiation of the right basal ganglia with associated vasogenic edema and mild mass effect. No parenchymal hemorrhage. Few scattered 1-3 mm cerebral and cerebellar calcifications. No mass lesion. No extra-axial collection. No midline shift. No hydrocephalus. Basilar cisterns are patent. Vascular: No hyperdense vessel. Skull: No acute fracture or focal lesion. Sinuses/Orbits: Paranasal sinuses and mastoid air cells are clear. The. Otherwise the orbits are unremarkable. Other: None. CT CERVICAL SPINE FINDINGS Alignment: Normal. Skull base and vertebrae: Diffusely decreased bone density. Multilevel mild degenerative changes of the spine. No acute fracture. No aggressive appearing focal osseous lesion or focal pathologic process. Soft tissues and spinal canal: No prevertebral fluid or swelling. No visible canal hematoma. Upper chest: A paraseptal and centrilobular emphysematous changes. Other: Atherosclerotic plaque of the aortic arch and its main branches. Atherosclerotic plaque of the carotid arteries within the neck. IMPRESSION: 1. Acute to early subacute right basal ganglia infarction. 2. No  acute intracranial hemorrhage. 3. No acute displaced fracture or traumatic listhesis  of the cervical spine. 4. Aortic Atherosclerosis (ICD10-I70.0) and Emphysema (ICD10-J43.9). Electronically Signed: By: Morgane  Naveau [name] On: 06/05/2024 21:42   CT Cervical Spine Wo Contrast Addendum Date: 06/05/2024 ADDENDUM REPORT: 06/05/2024 21:46 ADDENDUM: These results were called by telephone at the time of interpretation on 06/05/2024 at 9:46 pm to provider [facility] , who verbally acknowledged these results. Electronically Signed   By: Morgane  Naveau [name]   On: 06/05/2024 21:46   Result Date: 06/05/2024 CLINICAL DATA:  Head trauma, minor (Age >= 65y); Neck trauma (Age >= 65y). Altered mental status. EXAM: CT HEAD WITHOUT CONTRAST CT CERVICAL SPINE WITHOUT CONTRAST TECHNIQUE: Multidetector CT imaging of the head and cervical spine was performed following the standard protocol without intravenous contrast. Multiplanar CT image reconstructions of the cervical spine were also generated. RADIATION DOSE REDUCTION: This exam was performed according to the departmental dose-optimization program which includes automated exposure control, adjustment of the mA and/or kV according to patient size and/or use of iterative reconstruction technique. COMPARISON:  None Available. FINDINGS: CT HEAD FINDINGS Brain: Cerebral ventricle sizes are concordant with the degree of cerebral volume loss. Patchy and confluent areas of decreased attenuation are noted throughout the deep and periventricular white matter of the cerebral hemispheres bilaterally, compatible with chronic microvascular ischemic disease. Loss of gray-white matter differentiation of the right basal ganglia with associated vasogenic edema and mild mass effect. No parenchymal hemorrhage. Few scattered 1-3 mm cerebral and cerebellar calcifications. No mass lesion. No extra-axial collection. No midline shift. No hydrocephalus. Basilar cisterns are patent. Vascular: No  hyperdense vessel. Skull: No acute fracture or focal lesion. Sinuses/Orbits: Paranasal sinuses and mastoid air cells are clear. The. Otherwise the orbits are unremarkable. Other: None. CT CERVICAL SPINE FINDINGS Alignment: Normal. Skull base and vertebrae: Diffusely decreased bone density. Multilevel mild degenerative changes of the spine. No acute fracture. No aggressive appearing focal osseous lesion or focal pathologic process. Soft tissues and spinal canal: No prevertebral fluid or swelling. No visible canal hematoma. Upper chest: A paraseptal and centrilobular emphysematous changes. Other: Atherosclerotic plaque of the aortic arch and its main branches. Atherosclerotic plaque of the carotid arteries within the neck. IMPRESSION: 1. Acute to early subacute right basal ganglia infarction. 2. No acute intracranial hemorrhage. 3. No acute displaced fracture or traumatic listhesis of the cervical spine. 4. Aortic Atherosclerosis (ICD10-I70.0) and Emphysema (ICD10-J43.9). Electronically Signed: By: Morgane  Naveau [name] On: 06/05/2024 21:42   CT CHEST ABDOMEN PELVIS W CONTRAST Result Date: 06/05/2024 CLINICAL DATA:  Polytrauma, blunt AMS, ?sepsis. question sepsis. EXAM: CT CHEST, ABDOMEN, AND PELVIS WITH CONTRAST TECHNIQUE: Multidetector CT imaging of the chest, abdomen and pelvis was performed following the standard protocol during bolus administration of intravenous contrast. RADIATION DOSE REDUCTION: This exam was performed according to the departmental dose-optimization program which includes automated exposure control, adjustment of the mA and/or kV according to patient size and/or use of iterative reconstruction technique. CONTRAST:  65mL OMNIPAQUE  IOHEXOL  350 MG/ML SOLN COMPARISON:  11/10/2005 FINDINGS: CT CHEST FINDINGS Cardiovascular: Heart is borderline in size. Aorta normal caliber. Aortic atherosclerosis. Mediastinum/Nodes: No mediastinal, hilar, or axillary adenopathy. Trachea and esophagus are  unremarkable. Thyroid unremarkable. Lungs/Pleura: No confluent airspace opacities or effusions. No pneumothorax. Musculoskeletal: Chest wall soft tissues are unremarkable. No acute bony abnormality. CT ABDOMEN PELVIS FINDINGS Hepatobiliary: No focal hepatic abnormality. Gallbladder unremarkable. Pancreas: No focal abnormality or ductal dilatation. Spleen: No focal abnormality.  Normal size. Adrenals/Urinary Tract: Stable left adrenal nodule compatible with adenoma. Right adrenal gland normal. 5.3 cm cyst in  the upper pole of the right kidney appears simple. No follow-up imaging recommended. No hydronephrosis. Stomach/Bowel: Left colonic diverticulosis. No active diverticulitis. Stomach and small bowel decompressed. No bowel obstruction or inflammatory process. Vascular/Lymphatic: Aortic atherosclerosis. No evidence of aneurysm or adenopathy. Reproductive: No visible focal abnormality. Other: No free fluid or free air. Musculoskeletal: No acute bony abnormality. IMPRESSION: No acute findings or significant traumatic injury in the chest, abdomen or pelvis. Aortic atherosclerosis. Left colonic diverticulosis. Electronically Signed   By: Franky Crease [name]   On: 06/05/2024 21:40   DG Shoulder Left Result Date: 06/05/2024 CLINICAL DATA:  Left shoulder pain after a fall.  Abrasions. EXAM: DG SHOULDER 2+V*L* COMPARISON:  None Available. FINDINGS: Transverse mildly comminuted fracture of the distal left clavicle. Fracture does not appear to involve the acromioclavicular joint. No significant displacement. The glenohumeral joint appears intact without additional fracture or dislocation. Degenerative changes in the glenohumeral and acromioclavicular joints. No focal bone lesions identified. Soft tissues are unremarkable. IMPRESSION: Transverse nondisplaced fracture of the distal left clavicle. Electronically Signed   By: Elsie Gravely [name]   On: 06/05/2024 19:31    Vitals:   06/06/24 0924 06/06/24 0927 06/06/24 0930  06/06/24 1158  BP: (!) 151/139  130/72 (!) 141/74  Pulse:   82 62  Resp: (!) 22  17 (!) 22  Temp:    99.4 F (37.4 C)  TempSrc:    Axillary  SpO2:  100% 100% 95%  Weight:      Height:         PHYSICAL EXAM General:  Alert, well-nourished, well-developed patient in no acute distress Psych:  Mood and affect appropriate for situation CV: A-fib on the monitor Respiratory:  Regular, unlabored respirations on room air   NEURO:  Mental Status: AA&Ox3, patient is able to give clear and coherent history Speech/Language: speech is without dysarthria or aphasia.    Cranial Nerves:  II: PERRL.  Blinks to threat bilaterally III, IV, VI: EOMI. Eyelids elevate symmetrically.  V: Sensation is intact to light touch and symmetrical to face.  VII: Subtle left facial droop VIII: hearing intact to voice. IX, X: Palate elevates symmetrically. Phonation is normal.  XII: tongue is midline without fasciculations. Motor: Able to move all 4 extremities with antigravity strength, but slight drift lower in the left leg Tone: is normal and bulk is normal Sensation- Intact to light touch bilaterally.  Coordination: FTN intact bilaterally Gait- deferred  Most Recent NIH  1a Level of Conscious.: 0 1b LOC Questions: 0 1c LOC Commands: 0 2 Best Gaze: 0 3 Visual: 0 4 Facial Palsy: 1 5a Motor Arm - left: 0 5b Motor Arm - Right: 0 6a Motor Leg - Left: 1 6b Motor Leg - Right: 0 7 Limb Ataxia: 0 8 Sensory: 0 9 Best Language: 0 10 Dysarthria: 0 11 Extinct. and Inatten.: 0 TOTAL: 2   ASSESSMENT/PLAN  [name] is a 89 y.o. male with history of mitral valve prolapse with mitral regurgitation status postrepair in 2007 complicated by postop A-fib no longer on anticoagulation, stroke, CAD, hypertension, hyperlipidemia and BPH admitted after a fall with left shoulder pain, confusion and left facial droop.  He was found on MRI to have an acute infarct in the right basal ganglia.  He was found to  be in A-fib on admission.  After discussion with cardiology, patient is eligible for DOAC for management of his A-fib but will need to wait a few days given petechial hemorrhage seen on MRI.  NIH on Admission 3  Acute Ischemic Infarct:  left basal ganglia infarct Etiology: Embolic in the setting of A-fib not on anticoagulation CT head acute early subacute right basal ganglia infarct CTA head & neck no LVO or hemodynamically significant stenosis, bilateral fetal PCAs MRI acute infarct in right basal ganglia with associated petechial hemorrhage, small acute embolic infarct in right parietal cortex, moderate chronic periventricular white matter disease 2D Echo EF 60 to 65%.  Left atrium mildly dilated. LDL 127 HgbA1c 5.4 VTE prophylaxis -SCDs aspirin  81 mg daily prior to admission, now on aspirin  81 mg daily, will need to start Eliquis for A-fib in a few days given petechial hemorrhage Therapy recommendations:  Pending Disposition: Pending, likely home  Atrial fibrillation Home Meds: None Continue telemetry monitoring Begin anticoagulation with Eliquis after several days given petechial hemorrhage, start aspirin  today  Hypertension Home meds: Amlodipine  5 mg daily Stable Blood Pressure Goal: BP less than 220/110   Hyperlipidemia Home meds: Atorvastatin  20 mg daily, increased to 80 LDL 127, goal < 70 Continue statin at discharge   Other Stroke Risk Factors None   Other Active Problems None  Hospital day # 0  Patient seen by NP with MD, MD to edit note as needed. Cortney E Everitt Clint Kill , MSN, AGACNP-BC [facility] See Amion for schedule and pager information 06/06/2024 12:51 PM   I have personally obtained history,examined this patient, reviewed notes, independently viewed imaging studies, participated in medical decision making and plan of care.ROS completed by me personally and pertinent positives fully documented  I have made any additions or clarifications  directly to the above note. Agree with note above.  Patient presented with a fall and difficulty in getting up due to leg weakness and MRI scan shows what right basal ganglia infarct the patient is in atrial fibrillation.  He will need to be started on long-term anticoagulation in a few days.  Continue aspirin  for now.  Continue ongoing stroke workup.  Aggressive risk factor modification.  No family available at the bedside for discussion.   I personally spent a total of 50 minutes in the care of the patient today including getting/reviewing separately obtained history, performing a medically appropriate exam/evaluation, counseling and educating, placing orders, referring and communicating with other health care professionals, documenting clinical information in the EHR, independently interpreting results, and coordinating care.        Eather Popp, MD Medical Director [facility] Pager: [phone] 06/06/2024 2:37 PM   To contact Stroke Continuity provider, please refer to [email or web address]. After hours, contact General Neurology

## 2024-06-06 NOTE — Telephone Encounter (Signed)
 Okay thanks for the information and make sure that we make his missed appointment this morning not a no-show because he is in the hospital.  Please make sure he knows to get a hospital follow-up when he leaves.

## 2024-06-06 NOTE — ED Notes (Signed)
 5W notified of patient transport upstairs.

## 2024-06-06 NOTE — H&P (Signed)
 History and Physical    Kyle [name] FMW:[id number] DOB: 1935/06/27 DOA: 06/05/2024  PCP: Dettinger, Fonda LABOR, MD  Patient coming from: Home  Chief Complaint: Fall  HPI: Kyle [name] is a 89 y.o. male with medical history significant of mitral valve prolapse with moderate to severe MR status post mitral valve repair in 2006 complicated by postop A-fib which was managed with amiodarone as well as Coumadin at that time but he is no longer on anticoagulation.  History of CVA in 2007, CAD, hypertension, hyperlipidemia, BPH presented to the ED via EMS for evaluation of fall, left shoulder pain, altered mental status, and left facial droop.  LKW 10 AM on 06/04/2024.  Labs notable for WBC count 13.4, glucose 112, creatinine 0.7, AST 53 and T. bili 1.8 (sample hemolyzed), ALT and alk phos normal, UA not suggestive of infection, blood cultures in process, lactic acid normal, COVID/influenza/RSV PCR negative.  X-ray of left shoulder showing transverse nondisplaced fracture of the distal left clavicle.  Placed in sling immobilizer.  CT head showing acute to early subacute right basal ganglia infarction.  CTA head and neck negative for LVO.  Brain MRI showing acute infarct in the right basal ganglia with associated petechial hemorrhage.  Also showing small acute embolic infarcts in the right parietal cortex.  CT C-spine negative for acute findings.  CT chest/abdomen/pelvis with contrast negative for acute findings.  EKG showing atrial fibrillation and right bundle blanch block which appears new compared to previous EKG in the chart from January 2015.  Neurology consulting and requested admission for stroke workup.  [facility] called to admit.  Patient states he was at home yesterday walking to the kitchen when he felt dizzy, lost his balance and fell.  Denies head injury or loss of consciousness.  He does recall having heart palpitations about 30 minutes before this episode of dizziness and fall.  Denies chest pain.   Denies any pain in his clavicle or left shoulder at this time.  Denies recent illness.  Reports having occasional cough related to allergies.  Denies fever, shortness of breath, vomiting, diarrhea, abdominal pain, or any urinary symptoms.  He takes aspirin  81 mg daily.  He reports history of A-fib after mitral valve repair surgery in 2006 and was treated with anticoagulation for 6 months at that time.  No longer on anticoagulation now.  Review of Systems:  Review of Systems  All other systems reviewed and are negative.   Past Medical History:  Diagnosis Date   Atrial fibrillation [facility])    Post operative, maintaining normal sinus rhythm on amiodarone   Benign prostatic hypertrophy    CAD (coronary artery disease)    Non obstructive by catheterization Feb 2007 prior to his surgery revealing a 40-50% LAD lesion, good LV function with EF 55-60% by last echocardiogram prior to his surgery   Hyperlipidemia    Treated   Mitral regurgitation    S/P mitral valve repair Oct 17, 2005 by Dr. Dusty   Skin cancer     Past Surgical History:  Procedure Laterality Date   CARDIAC CATHETERIZATION  10/2005   COLONOSCOPY     MITRAL VALVE REPAIR  10/17/2005   Dr. Dusty   POLYPECTOMY     SKIN CANCER EXCISION Left    cheek     reports that he has quit smoking. His smoking use included cigarettes. He has never used smokeless tobacco. He reports that he does not drink alcohol and does not use drugs.  Allergies  Allergen  Reactions   Penicillins     REACTION: dizziness/syncope    Family History  Problem Relation Age of Onset   COPD Father    Cancer Brother        melanoma   Heart disease Neg Hx    Colon cancer Neg Hx     Prior to Admission medications   Medication Sig Start Date End Date Taking? Authorizing Provider  amLODipine  (NORVASC ) 5 MG tablet Take 1 tablet (5 mg total) by mouth daily. 12/05/23  Yes Dettinger, Fonda LABOR, MD  aspirin  81 MG tablet Take 81 mg by mouth daily.     Yes [provider]  atorvastatin  (LIPITOR) 20 MG tablet Take 1 tablet (20 mg total) by mouth daily. 12/05/23  Yes Dettinger, Fonda LABOR, MD  fish oil-omega-3 fatty acids  1000 MG capsule Take 1 capsule by mouth 2 (two) times daily.   Yes [provider]  Glucosamine-Chondroitin (OSTEO BI-FLEX REGULAR STRENGTH PO) Take 1 tablet by mouth in the morning and at bedtime.   Yes [provider]  Aspirin -Acetaminophen -Caffeine (GOODYS EXTRA STRENGTH PO) Take by mouth as needed. Arthritis    [provider]    Physical Exam: Vitals:   06/06/24 0015 06/06/24 0300 06/06/24 0359 06/06/24 0400  BP: 123/85 119/69  (!) 164/134  Pulse: 95 86  98  Resp: (!) 21 19  (!) 21  Temp:   98 F (36.7 C)   TempSrc:   Axillary   SpO2: 92% 98%  (!) 88%  Weight:      Height:        Physical Exam Vitals reviewed.  Constitutional:      General: He is not in acute distress. HENT:     Head: Normocephalic and atraumatic.  Eyes:     Extraocular Movements: Extraocular movements intact.  Cardiovascular:     Rate and Rhythm: Normal rate and regular rhythm.     Heart sounds: Normal heart sounds.  Pulmonary:     Effort: Pulmonary effort is normal. No respiratory distress.     Breath sounds: Normal breath sounds.  Abdominal:     General: Bowel sounds are normal. There is no distension.     Palpations: Abdomen is soft.     Tenderness: There is no abdominal tenderness.  Musculoskeletal:     Cervical back: Normal range of motion.     Right lower leg: No edema.     Left lower leg: No edema.  Skin:    General: Skin is warm and dry.  Neurological:     General: No focal deficit present.     Mental Status: He is alert and oriented to person, place, and time.     Cranial Nerves: No cranial nerve deficit.     Sensory: No sensory deficit.     Motor: No weakness.     Labs on Admission: I have personally reviewed following labs and imaging studies  CBC: Recent Labs  Lab 06/05/24 1900  WBC  13.4*  NEUTROABS 9.9*  HGB 14.5  HCT 42.9  MCV 89.7  PLT 272   Basic Metabolic Panel: Recent Labs  Lab 06/05/24 1900  NA 137  K 3.9  CL 99  CO2 24  GLUCOSE 112*  BUN 13  CREATININE 0.76  CALCIUM  9.4   GFR: Estimated Creatinine Clearance: 60.8 mL/min (by C-G formula based on SCr of 0.76 mg/dL). Liver Function Tests: Recent Labs  Lab 06/05/24 1900  AST 53*  ALT 23  ALKPHOS 45  BILITOT 1.8*  PROT 7.2  ALBUMIN 4.2   No results for input(s): LIPASE, AMYLASE in the last 168 hours. No results for input(s): AMMONIA in the last 168 hours. Coagulation Profile: No results for input(s): INR, PROTIME in the last 168 hours. Cardiac Enzymes: No results for input(s): CKTOTAL, CKMB, CKMBINDEX, TROPONINI in the last 168 hours. BNP (last 3 results) No results for input(s): PROBNP in the last 8760 hours. HbA1C: No results for input(s): HGBA1C in the last 72 hours. CBG: No results for input(s): GLUCAP in the last 168 hours. Lipid Profile: No results for input(s): CHOL, HDL, LDLCALC, TRIG, CHOLHDL, LDLDIRECT in the last 72 hours. Thyroid Function Tests: No results for input(s): TSH, T4TOTAL, FREET4, T3FREE, THYROIDAB in the last 72 hours. Anemia Panel: No results for input(s): VITAMINB12, FOLATE, FERRITIN, TIBC, IRON, RETICCTPCT in the last 72 hours. Urine analysis:    Component Value Date/Time   COLORURINE YELLOW 06/05/2024 1819   APPEARANCEUR CLEAR 06/05/2024 1819   LABSPEC 1.021 06/05/2024 1819   PHURINE 5.0 06/05/2024 1819   GLUCOSEU NEGATIVE 06/05/2024 1819   HGBUR NEGATIVE 06/05/2024 1819   BILIRUBINUR NEGATIVE 06/05/2024 1819   KETONESUR 20 (A) 06/05/2024 1819   PROTEINUR NEGATIVE 06/05/2024 1819   NITRITE NEGATIVE 06/05/2024 1819   LEUKOCYTESUR TRACE (A) 06/05/2024 1819    Radiological Exams on Admission: MR BRAIN WO CONTRAST Result Date: 06/06/2024 EXAM: MRI BRAIN WITHOUT CONTRAST 06/06/2024 05:21:59 AM  TECHNIQUE: Multiplanar multisequence MRI of the head/brain was performed without the administration of intravenous contrast. COMPARISON: CT of the head dated 06/05/2024 and MRI of the head dated 11/10/2005. CLINICAL HISTORY: Neuro deficit, acute, stroke suspected. FINDINGS: BRAIN AND VENTRICLES: Restricted diffusion present within the right basal ganglia compatible with acute nonhemorrhagic infarct. Restricted diffusion present within the right parietal lobe cortex compatible with small embolic infarcts. Blooming artifact present within the right basal ganglia, suggesting petechial hemorrhage. Focus of hemosiderin staining present laterally within the left cerebellar hemisphere. Moderate periventricular white matter disease. No mass. No midline shift. No hydrocephalus. No significant cerebral swelling or mass effect. The sella is unremarkable. Normal flow voids. ORBITS: No acute abnormality. SINUSES AND MASTOIDS: No acute abnormality. BONES AND SOFT TISSUES: Normal marrow signal. No acute soft tissue abnormality. IMPRESSION: 1. Acute infarct in the right basal ganglia with associated petechial hemorrhage. 2. Small acute embolic infarcts in the right parietal cortex. 3. No significant cerebral swelling or mass effect. 4. Moderate chronic periventricular white matter disease. Electronically signed by: Evalene Coho MD 06/06/2024 05:59 AM EDT RP Workstation: [id number]   CT ANGIO HEAD NECK W WO CM Result Date: 06/06/2024 EXAM: CTA HEAD AND NECK WITH AND WITHOUT 06/06/2024 04:41:43 AM TECHNIQUE: CTA of the head and neck was performed with and without the administration of 75 mL iohexol  (OMNIPAQUE ) 350 MG/ML injection. Multiplanar 2D and/or 3D reformatted images are provided for review. Automated exposure control, iterative reconstruction, and/or weight based adjustment of the mA/kV was utilized to reduce the radiation dose to as low as reasonably achievable. Stenosis of the internal carotid arteries measured using  NASCET criteria. COMPARISON: None available CLINICAL HISTORY: Neuro deficit, acute, stroke suspected. FINDINGS: CTA NECK: AORTIC ARCH AND ARCH VESSELS: There is mild-to-moderate calcific atheromatous disease within the aortic arch. No dissection or arterial injury. No significant stenosis of the brachiocephalic or subclavian arteries. CERVICAL CAROTID ARTERIES: There is calcific atheromatous disease within the carotid bulbs bilaterally, with less than 20% luminal stenosis bilaterally. No dissection or arterial injury. CERVICAL VERTEBRAL ARTERIES: The vertebral arteries are codominant and normal in caliber  throughout the respective courses. No dissection, arterial injury, or significant stenosis. LUNGS AND MEDIASTINUM: Unremarkable. SOFT TISSUES: No acute abnormality. BONES: No acute abnormality. CTA HEAD: ANTERIOR CIRCULATION: No significant stenosis of the internal carotid arteries. No significant stenosis of the anterior cerebral arteries. No significant stenosis of the middle cerebral arteries. No aneurysm. POSTERIOR CIRCULATION: There is fetal type origin of the posterior cerebral arteries bilaterally. No significant stenosis of the posterior cerebral arteries. No significant stenosis of the basilar artery. No significant stenosis of the vertebral arteries. No aneurysm. OTHER: No dural venous sinus thrombosis on this non-dedicated study. IMPRESSION: 1. No large vessel occlusion or aneurysm in the head or neck. 2. Bilateral carotid bulb atherosclerosis with less than 20% luminal stenosis. 3. Fetal-type origin of both posterior cerebral arteries (anatomic variant). Electronically signed by: Evalene Coho MD 06/06/2024 05:54 AM EDT RP Workstation: [id number]   CT Head Wo Contrast Addendum Date: 06/05/2024 ADDENDUM REPORT: 06/05/2024 21:46 ADDENDUM: These results were called by telephone at the time of interpretation on 06/05/2024 at 9:46 pm to provider [facility] , who verbally acknowledged these results.  Electronically Signed   By: Morgane  Naveau [name]   On: 06/05/2024 21:46   Result Date: 06/05/2024 CLINICAL DATA:  Head trauma, minor (Age >= 65y); Neck trauma (Age >= 65y). Altered mental status. EXAM: CT HEAD WITHOUT CONTRAST CT CERVICAL SPINE WITHOUT CONTRAST TECHNIQUE: Multidetector CT imaging of the head and cervical spine was performed following the standard protocol without intravenous contrast. Multiplanar CT image reconstructions of the cervical spine were also generated. RADIATION DOSE REDUCTION: This exam was performed according to the departmental dose-optimization program which includes automated exposure control, adjustment of the mA and/or kV according to patient size and/or use of iterative reconstruction technique. COMPARISON:  None Available. FINDINGS: CT HEAD FINDINGS Brain: Cerebral ventricle sizes are concordant with the degree of cerebral volume loss. Patchy and confluent areas of decreased attenuation are noted throughout the deep and periventricular white matter of the cerebral hemispheres bilaterally, compatible with chronic microvascular ischemic disease. Loss of gray-white matter differentiation of the right basal ganglia with associated vasogenic edema and mild mass effect. No parenchymal hemorrhage. Few scattered 1-3 mm cerebral and cerebellar calcifications. No mass lesion. No extra-axial collection. No midline shift. No hydrocephalus. Basilar cisterns are patent. Vascular: No hyperdense vessel. Skull: No acute fracture or focal lesion. Sinuses/Orbits: Paranasal sinuses and mastoid air cells are clear. The. Otherwise the orbits are unremarkable. Other: None. CT CERVICAL SPINE FINDINGS Alignment: Normal. Skull base and vertebrae: Diffusely decreased bone density. Multilevel mild degenerative changes of the spine. No acute fracture. No aggressive appearing focal osseous lesion or focal pathologic process. Soft tissues and spinal canal: No prevertebral fluid or swelling. No visible canal  hematoma. Upper chest: A paraseptal and centrilobular emphysematous changes. Other: Atherosclerotic plaque of the aortic arch and its main branches. Atherosclerotic plaque of the carotid arteries within the neck. IMPRESSION: 1. Acute to early subacute right basal ganglia infarction. 2. No acute intracranial hemorrhage. 3. No acute displaced fracture or traumatic listhesis of the cervical spine. 4. Aortic Atherosclerosis (ICD10-I70.0) and Emphysema (ICD10-J43.9). Electronically Signed: By: Morgane  Naveau [name] On: 06/05/2024 21:42   CT Cervical Spine Wo Contrast Addendum Date: 06/05/2024 ADDENDUM REPORT: 06/05/2024 21:46 ADDENDUM: These results were called by telephone at the time of interpretation on 06/05/2024 at 9:46 pm to provider [facility] , who verbally acknowledged these results. Electronically Signed   By: Morgane  Naveau [name]   On: 06/05/2024 21:46   Result Date: 06/05/2024  CLINICAL DATA:  Head trauma, minor (Age >= 65y); Neck trauma (Age >= 65y). Altered mental status. EXAM: CT HEAD WITHOUT CONTRAST CT CERVICAL SPINE WITHOUT CONTRAST TECHNIQUE: Multidetector CT imaging of the head and cervical spine was performed following the standard protocol without intravenous contrast. Multiplanar CT image reconstructions of the cervical spine were also generated. RADIATION DOSE REDUCTION: This exam was performed according to the departmental dose-optimization program which includes automated exposure control, adjustment of the mA and/or kV according to patient size and/or use of iterative reconstruction technique. COMPARISON:  None Available. FINDINGS: CT HEAD FINDINGS Brain: Cerebral ventricle sizes are concordant with the degree of cerebral volume loss. Patchy and confluent areas of decreased attenuation are noted throughout the deep and periventricular white matter of the cerebral hemispheres bilaterally, compatible with chronic microvascular ischemic disease. Loss of gray-white matter differentiation of  the right basal ganglia with associated vasogenic edema and mild mass effect. No parenchymal hemorrhage. Few scattered 1-3 mm cerebral and cerebellar calcifications. No mass lesion. No extra-axial collection. No midline shift. No hydrocephalus. Basilar cisterns are patent. Vascular: No hyperdense vessel. Skull: No acute fracture or focal lesion. Sinuses/Orbits: Paranasal sinuses and mastoid air cells are clear. The. Otherwise the orbits are unremarkable. Other: None. CT CERVICAL SPINE FINDINGS Alignment: Normal. Skull base and vertebrae: Diffusely decreased bone density. Multilevel mild degenerative changes of the spine. No acute fracture. No aggressive appearing focal osseous lesion or focal pathologic process. Soft tissues and spinal canal: No prevertebral fluid or swelling. No visible canal hematoma. Upper chest: A paraseptal and centrilobular emphysematous changes. Other: Atherosclerotic plaque of the aortic arch and its main branches. Atherosclerotic plaque of the carotid arteries within the neck. IMPRESSION: 1. Acute to early subacute right basal ganglia infarction. 2. No acute intracranial hemorrhage. 3. No acute displaced fracture or traumatic listhesis of the cervical spine. 4. Aortic Atherosclerosis (ICD10-I70.0) and Emphysema (ICD10-J43.9). Electronically Signed: By: Morgane  Naveau [name] On: 06/05/2024 21:42   CT CHEST ABDOMEN PELVIS W CONTRAST Result Date: 06/05/2024 CLINICAL DATA:  Polytrauma, blunt AMS, ?sepsis. question sepsis. EXAM: CT CHEST, ABDOMEN, AND PELVIS WITH CONTRAST TECHNIQUE: Multidetector CT imaging of the chest, abdomen and pelvis was performed following the standard protocol during bolus administration of intravenous contrast. RADIATION DOSE REDUCTION: This exam was performed according to the departmental dose-optimization program which includes automated exposure control, adjustment of the mA and/or kV according to patient size and/or use of iterative reconstruction technique.  CONTRAST:  65mL OMNIPAQUE  IOHEXOL  350 MG/ML SOLN COMPARISON:  11/10/2005 FINDINGS: CT CHEST FINDINGS Cardiovascular: Heart is borderline in size. Aorta normal caliber. Aortic atherosclerosis. Mediastinum/Nodes: No mediastinal, hilar, or axillary adenopathy. Trachea and esophagus are unremarkable. Thyroid unremarkable. Lungs/Pleura: No confluent airspace opacities or effusions. No pneumothorax. Musculoskeletal: Chest wall soft tissues are unremarkable. No acute bony abnormality. CT ABDOMEN PELVIS FINDINGS Hepatobiliary: No focal hepatic abnormality. Gallbladder unremarkable. Pancreas: No focal abnormality or ductal dilatation. Spleen: No focal abnormality.  Normal size. Adrenals/Urinary Tract: Stable left adrenal nodule compatible with adenoma. Right adrenal gland normal. 5.3 cm cyst in the upper pole of the right kidney appears simple. No follow-up imaging recommended. No hydronephrosis. Stomach/Bowel: Left colonic diverticulosis. No active diverticulitis. Stomach and small bowel decompressed. No bowel obstruction or inflammatory process. Vascular/Lymphatic: Aortic atherosclerosis. No evidence of aneurysm or adenopathy. Reproductive: No visible focal abnormality. Other: No free fluid or free air. Musculoskeletal: No acute bony abnormality. IMPRESSION: No acute findings or significant traumatic injury in the chest, abdomen or pelvis. Aortic atherosclerosis. Left colonic diverticulosis. Electronically Signed  By: Franky Crease [name]   On: 06/05/2024 21:40   DG Shoulder Left Result Date: 06/05/2024 CLINICAL DATA:  Left shoulder pain after a fall.  Abrasions. EXAM: DG SHOULDER 2+V*L* COMPARISON:  None Available. FINDINGS: Transverse mildly comminuted fracture of the distal left clavicle. Fracture does not appear to involve the acromioclavicular joint. No significant displacement. The glenohumeral joint appears intact without additional fracture or dislocation. Degenerative changes in the glenohumeral and  acromioclavicular joints. No focal bone lesions identified. Soft tissues are unremarkable. IMPRESSION: Transverse nondisplaced fracture of the distal left clavicle. Electronically Signed   By: Elsie Gravely [name]   On: 06/05/2024 19:31    Assessment and Plan  Acute CVA In the setting of history of A-fib not on anticoagulation.  Brain MRI showing acute infarct in the right basal ganglia with associated petechial hemorrhage.  Also showing small acute embolic infarcts in the right parietal cortex.  CTA head and neck negative for LVO.  Discussed MRI findings with neurology, recommending starting aspirin  81 mg daily for now and will have to be started on anticoagulation for his A-fib in a few days.  Stroke team will follow.  Echocardiogram, lipid panel, and A1c ordered.  Frequent neurochecks per stroke unit protocol.  Telemetry monitoring, bedside swallow screen prior to p.o. intake, stroke education booklet, PT/OT/SLP consult.  Paroxysmal A-fib Currently rate controlled.  Not on anticoagulation.  CHA2DS2-VASc score 6.  Discussed with neurology and holding off starting anticoagulation at this time due to petechial hemorrhage on MRI.  He will have to be started on anticoagulation in a few days based on recommendation from stroke team.  Left distal clavicle fracture In the setting of a fall prior to ED arrival. X-ray of left shoulder showing transverse nondisplaced fracture of the distal left clavicle.  Placed in sling immobilizer in the ED. Patient is not endorsing any pain at this time.  Will need evaluation by orthopedics.  Mild leukocytosis Likely reactive.  No obvious signs of infection.  Repeat CBC ordered..  Mild elevation of liver enzymes Sample hemolyzed and repeat labs ordered to confirm.  Hypertension Allow permissive hypertension at this time.  CAD EKG showing right bundle blanch block which appears new compared to previous EKG in the chart from over 10 years ago.  ACS less likely as  patient is not endorsing chest pain.  Check troponin.  Continue aspirin  and Lipitor.  Hyperlipidemia Continue Lipitor and Lovaza .  DVT prophylaxis: SCDs Code Status: DNR/DNI (discussed with the patient) Family Communication: No family available at this time. Consults called: Neurology Level of care: Progressive Care Unit Admission status: It is my clinical opinion that admission to INPATIENT is reasonable and necessary because of the expectation that this patient will require hospital care that crosses at least 2 midnights to treat this condition based on the medical complexity of the problems presented.  Given the aforementioned information, the predictability of an adverse outcome is felt to be significant.  Editha Ram MD [facility]  If 7PM-7AM, please contact night-coverage [email or web address]  06/06/2024, 6:13 AM

## 2024-06-06 NOTE — Telephone Encounter (Signed)
 Copied from CRM #[id number]. Topic: General - Other >> Jun 06, 2024 11:33 AM Tonda B wrote: Reason for CRM: patient daughter marval brazen  is calling saying that patient is in the hospital wanted the provider to know if any questions please call [phone]

## 2024-06-06 NOTE — Telephone Encounter (Signed)
 FYI

## 2024-06-06 NOTE — Plan of Care (Signed)
  Problem: Education: Goal: Knowledge of disease or condition will improve Outcome: Progressing Goal: Knowledge of secondary prevention will improve (MUST DOCUMENT ALL) Outcome: Progressing Goal: Knowledge of patient specific risk factors will improve (DELETE if not current risk factor) Outcome: Progressing   Problem: Ischemic Stroke/TIA Tissue Perfusion: Goal: Complications of ischemic stroke/TIA will be minimized Outcome: Progressing   Problem: Coping: Goal: Will verbalize positive feelings about self Outcome: Progressing Goal: Will identify appropriate support needs Outcome: Progressing   Problem: Self-Care: Goal: Verbalization of feelings and concerns over difficulty with self-care will improve Outcome: Progressing Goal: Ability to communicate needs accurately will improve Outcome: Progressing   Problem: Nutrition: Goal: Risk of aspiration will decrease Outcome: Progressing   Problem: Education: Goal: Knowledge of General Education information will improve Description: Including pain rating scale, medication(s)/side effects and non-pharmacologic comfort measures Outcome: Progressing   Problem: Clinical Measurements: Goal: Ability to maintain clinical measurements within normal limits will improve Outcome: Progressing   Problem: Nutrition: Goal: Adequate nutrition will be maintained Outcome: Progressing   Problem: Coping: Goal: Level of anxiety will decrease Outcome: Progressing   Problem: Pain Managment: Goal: General experience of comfort will improve and/or be controlled Outcome: Progressing   Problem: Safety: Goal: Ability to remain free from injury will improve Outcome: Progressing

## 2024-06-06 NOTE — Progress Notes (Deleted)
 SLP Cancellation Note  Patient Details Name: Kyle [name] MRN: [id number] DOB: 1935/03/16   Cancelled treatment:       Reason Eval/Treat Not Completed: Patient at procedure or test/unavailable. Unable to complete SLE at this time. Pt currently off unit for testing. Will continue efforts.   Lema Heinkel B. Dory, MSP, CCC-SLP Speech Language Pathologist Office: [phone]  [name] 06/06/2024, 1:26 PM

## 2024-06-06 NOTE — Evaluation (Signed)
 Speech Language Pathology Evaluation Patient Details Name: Kyle [name] MRN: [id number] DOB: 1935/03/23 Today's Date: 06/06/2024 Time: 8654-8594 SLP Time Calculation (min) (ACUTE ONLY): 20 min  Problem List:  Patient Active Problem List   Diagnosis Date Noted   Acute CVA (cerebrovascular accident) (HCC) 06/06/2024   Clavicle fracture 06/06/2024   Hypertension 12/01/2022   Hyperlipidemia 05/07/2009   MITRAL REGURGITATION 05/07/2009   Atrial fibrillation (HCC) 05/07/2009   Past Medical History:  Past Medical History:  Diagnosis Date   Atrial fibrillation (HCC)    Post operative, maintaining normal sinus rhythm on amiodarone   Benign prostatic hypertrophy    CAD (coronary artery disease)    Non obstructive by catheterization Feb 2007 prior to his surgery revealing a 40-50% LAD lesion, good LV function with EF 55-60% by last echocardiogram prior to his surgery   Hyperlipidemia    Treated   Mitral regurgitation    S/P mitral valve repair Oct 17, 2005 by Dr. Dusty   Skin cancer    Past Surgical History:  Past Surgical History:  Procedure Laterality Date   CARDIAC CATHETERIZATION  10/2005   COLONOSCOPY     MITRAL VALVE REPAIR  10/17/2005   Dr. Dusty   POLYPECTOMY     SKIN CANCER EXCISION Left    cheek   HPI:  Kyle [name] admitted 06/05/24 after a fall at home. Left shoulder pain, AMS, left facial droop. PMH: AFib, mitral valve prolapse, mod-sev MR, CVA (2007), CAD, HTN, HLD, BPH. MRI - acute infarct right basal ganglia with associated petechial hemorrhage. Small acute embolic infarcts right parietal cortex, moderate chronic periventricular white matter disease.   Assessment / Plan / Recommendation Clinical Impression  Pt seen at bedside for cognitive linguistic evaluation. Pt reports a 10th grade education. Prior to admit, pt lived alone and was fully independent (driving, managing finances/medications and all household responsibilities).   Pt speech is largely intelligible with  mild dysarthria observed. Family indicates improvement in speech since admit. Slight left orofacial weakness. Receptive and Expressive Language appear intact. The [employer] Mental Status (SLUMS) Examination was administered today. Pt scored 13/30, raising concern for neurocognitive deficits. Pt exhibited difficulty with mental math, immediate and delayed recall, thought organization (named 7 animals in 60 seconds, N=15+), digit reversal, clock drawing, visuospatial task, and auditory attention and recall. F  Family reports recent onset of confusion just prior to admit - pt has lived alone since his wife died 2 years ago, but he was calling for her after he fell, and has been asking if she was found at home. Performance today raises significant concern for neurocognitive deficits, and pt safety for living independently. Medical team informed of results. ST will follow acutely.    SLP Assessment  SLP Recommendation/Assessment: Patient needs continued Speech Language Pathology Services SLP Visit Diagnosis: Cognitive communication deficit (R41.841);Dysarthria and anarthria (R47.1)     Assistance Recommended at Discharge  Frequent or constant Supervision/Assistance  Functional Status Assessment Patient has had a recent decline in their functional status and demonstrates the ability to make significant improvements in function in a reasonable and predictable amount of time.   Frequency and Duration min 1 x/week  2 weeks      SLP Evaluation Cognition  Overall Cognitive Status: Impaired/Different from baseline Orientation Level: Oriented X4       Comprehension  Auditory Comprehension Overall Auditory Comprehension: Appears within functional limits for tasks assessed    Expression Expression Primary Mode of Expression: Verbal Verbal Expression Overall Verbal Expression: Appears  within functional limits for tasks assessed Written Expression Dominant Hand: Right   Oral / Motor  Oral  Motor/Sensory Function Overall Oral Motor/Sensory Function: Mild impairment Facial ROM: Reduced left Facial Strength: Reduced left Facial Sensation: Within Functional Limits Lingual ROM: Within Functional Limits Lingual Symmetry: Within Functional Limits Lingual Strength: Reduced Lingual Sensation: Within Functional Limits Motor Speech Overall Motor Speech: Impaired Respiration: Within functional limits Resonance: Within functional limits Articulation: Within functional limitis Intelligibility: Intelligibility reduced (mild dysarthria) Word: 75-100% accurate Phrase: 75-100% accurate Sentence: 75-100% accurate Conversation: 75-100% accurate Motor Planning: Within functional limits Motor Speech Errors: Not applicable           [name], MSP, CCC-SLP Speech Language Pathologist Office: [phone]  [name] 06/06/2024, 2:19 PM

## 2024-06-06 NOTE — TOC Initial Note (Addendum)
 Transition of Care [facility]) - Initial/Assessment Note    Patient Details  Name: Kyle [name] MRN: [id number] Date of Birth: 12/07/1934  [facility] of [facility]) CM/SW Contact:    Corean JAYSON Canary, RN Phone Number: 06/06/2024, 4:17 PM  Clinical Narrative:                 89 year old paitent lives alone after his wife passed away 2 years ago. He has been doing fine, independent.  PMH of mitral valve, DOAC for 6 months afib. Presents with slurred speech, CVA Speech evaluation, , patient with garbled speech, lower cognition., confusion.  Awaiting  PT and OT evaluation.   Patient would need assistance post DC- may need SNF vs HH. Will monitor progress.     Expected Discharge Plan: Skilled Nursing Facility Barriers to Discharge: Continued Medical Work up   Patient Goals and CMS Choice            Expected Discharge Plan and Services    SNF   Living arrangements for the past 2 months: Single Family Home                                      Prior Living Arrangements/Services Living arrangements for the past 2 months: Single Family Home Lives with:: Self Patient language and need for interpreter reviewed:: Yes        Need for Family Participation in Patient Care: Yes (Comment) Care giver support system in place?: Yes (comment)   Criminal Activity/Legal Involvement Pertinent to Current Situation/Hospitalization: No - Comment as needed  Activities of Daily Living      Permission Sought/Granted                  Emotional Assessment       Orientation: : Fluctuating Orientation (Suspected and/or reported Sundowners) Alcohol / Substance Use: Not Applicable Psych Involvement: No (comment)  Admission diagnosis:  Basal ganglia infarction (HCC) [I63.81] Acute CVA (cerebrovascular accident) [facility]) [I63.9] Patient Active Problem List   Diagnosis Date Noted   Acute CVA (cerebrovascular accident) (HCC) 06/06/2024   Clavicle fracture 06/06/2024   Hypertension  12/01/2022   Hyperlipidemia 05/07/2009   MITRAL REGURGITATION 05/07/2009   Atrial fibrillation (HCC) 05/07/2009   PCP:  Dettinger, Fonda LABOR, MD Pharmacy:   [facility], [place] - [address] [address] Phone: [phone] Fax: [phone]     Social Drivers of Health (SDOH) Social History: SDOH Screenings   Food Insecurity: No Food Insecurity (09/06/2023)  Housing: Unknown (09/06/2023)  Transportation Needs: No Transportation Needs (09/06/2023)  Utilities: Not At Risk (09/06/2023)  Alcohol Screen: Low Risk  (09/06/2023)  Depression (PHQ2-9): Low Risk  (12/05/2023)  Financial Resource Strain: Low Risk  (09/06/2023)  Physical Activity: Insufficiently Active (09/06/2023)  Social Connections: Moderately Integrated (09/06/2023)  Stress: No Stress Concern Present (09/06/2023)  Tobacco Use: Medium Risk (06/05/2024)  Health Literacy: Adequate Health Literacy (09/06/2023)   SDOH Interventions:     Readmission Risk Interventions     No data to display

## 2024-06-06 NOTE — ED Notes (Signed)
 Pt transported to CT ?

## 2024-06-07 DIAGNOSIS — Z7982 Long term (current) use of aspirin: Secondary | ICD-10-CM

## 2024-06-07 DIAGNOSIS — I6340 Cerebral infarction due to embolism of unspecified cerebral artery: Secondary | ICD-10-CM | POA: Diagnosis not present

## 2024-06-07 DIAGNOSIS — I639 Cerebral infarction, unspecified: Secondary | ICD-10-CM | POA: Diagnosis not present

## 2024-06-07 DIAGNOSIS — E782 Mixed hyperlipidemia: Secondary | ICD-10-CM

## 2024-06-07 DIAGNOSIS — I10 Essential (primary) hypertension: Secondary | ICD-10-CM

## 2024-06-07 DIAGNOSIS — S42019S Nondisplaced fracture of sternal end of unspecified clavicle, sequela: Secondary | ICD-10-CM

## 2024-06-07 DIAGNOSIS — E785 Hyperlipidemia, unspecified: Secondary | ICD-10-CM | POA: Diagnosis not present

## 2024-06-07 DIAGNOSIS — R233 Spontaneous ecchymoses: Secondary | ICD-10-CM | POA: Diagnosis not present

## 2024-06-07 DIAGNOSIS — I480 Paroxysmal atrial fibrillation: Secondary | ICD-10-CM

## 2024-06-07 DIAGNOSIS — I4891 Unspecified atrial fibrillation: Secondary | ICD-10-CM | POA: Diagnosis not present

## 2024-06-07 MED ORDER — FAMOTIDINE 20 MG PO TABS
20.0000 mg | ORAL_TABLET | Freq: Every day | ORAL | Status: DC
  Administered 2024-06-07 – 2024-06-10 (×4): 20 mg via ORAL
  Filled 2024-06-07 (×4): qty 1

## 2024-06-07 MED ORDER — INFLUENZA VAC SPLIT HIGH-DOSE 0.5 ML IM SUSY
0.5000 mL | PREFILLED_SYRINGE | INTRAMUSCULAR | Status: DC
  Filled 2024-06-07: qty 0.5

## 2024-06-07 NOTE — Plan of Care (Signed)
  Problem: Education: Goal: Knowledge of disease or condition will improve Outcome: Progressing   Problem: Ischemic Stroke/TIA Tissue Perfusion: Goal: Complications of ischemic stroke/TIA will be minimized Outcome: Progressing   Problem: Coping: Goal: Will verbalize positive feelings about self Outcome: Progressing   Problem: Health Behavior/Discharge Planning: Goal: Ability to manage health-related needs will improve Outcome: Progressing   Problem: Self-Care: Goal: Ability to participate in self-care as condition permits will improve Outcome: Progressing   Problem: Nutrition: Goal: Risk of aspiration will decrease Outcome: Progressing   Problem: Education: Goal: Knowledge of General Education information will improve Description: Including pain rating scale, medication(s)/side effects and non-pharmacologic comfort measures Outcome: Progressing   Problem: Clinical Measurements: Goal: Ability to maintain clinical measurements within normal limits will improve Outcome: Progressing

## 2024-06-07 NOTE — Progress Notes (Signed)
 STROKE TEAM PROGRESS NOTE    INTERIM HISTORY/SUBJECTIVE Patient presented with a fall and inability to get up and reported weakness in legs.  MRI shows acute basal ganglia infarct on the right as well as small acute right parietal cortex.  CT angiogram shows no large vessel stenosis or occlusion.  Patient remains in atrial fibrillation.  He has been hemodynamically stable and afebrile overnight.  OBJECTIVE  CBC    Component Value Date/Time   WBC 9.1 06/06/2024 0907   RBC 4.31 06/06/2024 0907   HGB 13.4 06/06/2024 0907   HGB 13.7 12/05/2023 0827   HCT 39.6 06/06/2024 0907   HCT 40.9 12/05/2023 0827   PLT 102 (L) 06/06/2024 0907   PLT 257 12/05/2023 0827   MCV 91.9 06/06/2024 0907   MCV 91 12/05/2023 0827   MCH 31.1 06/06/2024 0907   MCHC 33.8 06/06/2024 0907   RDW 13.2 06/06/2024 0907   RDW 12.6 12/05/2023 0827   LYMPHSABS 2.5 06/05/2024 1900   LYMPHSABS 2.6 12/05/2023 0827   MONOABS 0.9 06/05/2024 1900   EOSABS 0.0 06/05/2024 1900   EOSABS 0.1 12/05/2023 0827   BASOSABS 0.0 06/05/2024 1900   BASOSABS 0.1 12/05/2023 0827    BMET    Component Value Date/Time   NA 138 06/06/2024 0907   NA 138 12/05/2023 0827   K 3.6 06/06/2024 0907   CL 103 06/06/2024 0907   CO2 19 (L) 06/06/2024 0907   GLUCOSE 97 06/06/2024 0907   BUN 8 06/06/2024 0907   BUN 10 12/05/2023 0827   CREATININE 0.65 06/06/2024 0907   CREATININE 0.81 02/17/2013 1230   CALCIUM  8.6 (L) 06/06/2024 0907   EGFR 87 12/05/2023 0827   GFRNONAA >60 06/06/2024 0907   GFRNONAA 86 02/17/2013 1230    IMAGING past 24 hours No results found.   Vitals:   06/07/24 0400 06/07/24 0800 06/07/24 1200 06/07/24 1625  BP: 119/85 101/88 116/78 (!) 143/70  Pulse: 86 93 (!) 2 83  Resp: 20 20 20  (!) 22  Temp: 97.9 F (36.6 C) (!) 97.3 F (36.3 C) 97.6 F (36.4 C) 98.7 F (37.1 C)  TempSrc: Oral Oral Oral Oral  SpO2: 95% 95% 95% 98%  Weight:      Height:         PHYSICAL EXAM General:  Alert, well-nourished,  well-developed patient in no acute distress Psych:  Mood and affect appropriate for situation CV: A-fib on the monitor Respiratory:  Regular, unlabored respirations on room air   NEURO:  Mental Status: AA&Ox3, patient is able to give clear and coherent history Speech/Language: speech is without dysarthria or aphasia.    Cranial Nerves:  II: PERRL. Visual field full but more right gaze preference III, IV, VI: EOMI. Eyelids elevate symmetrically.  V: Sensation is intact to light touch and symmetrical to face.  VII: left facial droop VIII: hearing intact to voice. IX, X: Palate elevates symmetrically. Phonation is normal.  XII: tongue is midline without fasciculations. Motor: RUE 5/5, RLE 4/5, LLE 4-/5, LUE proximal 3/5 due to left shoulder injury on sling. L hand grip 3/5 due to pain on left shoulder Tone: is normal and bulk is normal Sensation- Intact to light touch bilaterally.  Coordination: FTN intact bilaterally Gait- deferred   ASSESSMENT/PLAN [name] is a 89 y.o. male with history of mitral valve prolapse with mitral regurgitation status postrepair in 2007 complicated by postop A-fib no longer on anticoagulation, stroke, CAD, hypertension, hyperlipidemia and BPH admitted after a fall with left  shoulder pain, confusion and left facial droop.  He was found on MRI to have an acute infarct in the right basal ganglia.  He was found to be in A-fib on admission.  After discussion with cardiology, patient is eligible for DOAC for management of his A-fib but will need to wait a few days given petechial hemorrhage seen on MRI.  NIH on Admission 3  Stroke:  left BG large infarct, etiology: Embolic in the setting of A-fib not on AC CT head acute early subacute right basal ganglia infarct CTA head & neck no LVO or hemodynamically significant stenosis, bilateral fetal PCAs MRI acute infarct in right basal ganglia with associated petechial hemorrhage, small acute embolic infarct in  right parietal cortex, moderate chronic periventricular white matter disease 2D Echo EF 60 to 65%.  Left atrium mildly dilated. LDL 127 HgbA1c 5.4 VTE prophylaxis - SCDs aspirin  81 mg daily prior to admission, now on aspirin  81 mg daily, will start Eliquis tomorrow given petechial hemorrhage Therapy recommendations:  CIR Disposition: Pending   Atrial fibrillation Home Meds: None History of MVR 2007 with postop A-fib Now admission with him on telemetry and EKG Continue telemetry monitoring On aspirin  now, will start Eliquis tomorrow  Hypertension Home meds: Amlodipine  5 mg daily Stable Long-term BP goal normotensive  Hyperlipidemia Home meds: Atorvastatin  20 mg daily  LDL 127, goal < 70 Increase to Lipitor 40 Continue statin at discharge  Other Stroke Risk Factors CAD History of stroke, details not clear  Other Active Problems BPH Mild thrombocytopenia, platelet 102  Hospital day # 1   Ary Cummins, MD PhD Stroke Neurology 06/07/2024 7:28 PM   To contact Stroke Continuity provider, please refer to [email or web address]. After hours, contact General Neurology

## 2024-06-07 NOTE — Progress Notes (Signed)
 PROGRESS NOTE        PATIENT DETAILS Name: Kyle [name] Age: 89 y.o. Sex: male Date of Birth: 27-May-1935 Admit Date: 06/05/2024 Admitting Physician Editha Ram, MD ERE:Izuupwhzm, Fonda LABOR, MD  Brief Summary: Patient is a 89 y.o.  male with history of mitral valve repair with postoperative A-fib in 2006, CVA in 2007, CAD, HTN, HLD who presented with a fall-subsequent imaging studies demonstrated a left shoulder fracture and a acute CVA.  Significant events: 9/26>> admit to [facility]  Significant studies: 9/25>> x-ray left shoulder: Transverse nondisplaced fracture of the distal left clavicle. 9/25>> CT head: Subacute right basal ganglia infarct. 9/25>> CT chest/abdomen/pelvis: No acute findings. 9/26>> CT angio head/neck: No LVO/aneurysm in head/neck. 9/26>> MRI brain: Acute infarct right basal ganglia with associated petechial hemorrhage, small acute infarcts in the right parietal cortex. 9/26>> echo: EF 60-65% 9/26>> A1c: 5.4 9/26>> LDL: 127  Significant microbiology data: 9/25>> COVID/influenza/RSV PCR: Negative  Procedures: None  Consults: Neurology  Subjective: Lying comfortably in bed-denies any chest pain or shortness of breath.  Objective: Vitals: Blood pressure 101/88, pulse 93, temperature (!) 97.3 F (36.3 C), temperature source Oral, resp. rate 20, height 5' 7 (1.702 m), weight 70.3 kg, SpO2 95%.   Exam: Gen Exam:Alert awake-not in any distress HEENT:atraumatic, normocephalic Chest: B/L clear to auscultation anteriorly CVS:S1S2 regular Abdomen:soft non tender, non distended Extremities:no edema Neurology: Appears to be moving all 4 extremities symmetrically. Skin: no rash  Pertinent Labs/Radiology:    Latest Ref Rng & Units 06/06/2024    9:07 AM 06/05/2024    7:00 PM 12/05/2023    8:27 AM  CBC  WBC 4.0 - 10.5 K/uL 9.1  13.4  7.2   Hemoglobin 13.0 - 17.0 g/dL 86.5  85.4  86.2   Hematocrit 39.0 - 52.0 % 39.6  42.9  40.9    Platelets 150 - 400 K/uL 102  272  257     Lab Results  Component Value Date   NA 138 06/06/2024   K 3.6 06/06/2024   CL 103 06/06/2024   CO2 19 (L) 06/06/2024      Assessment/Plan: Acute CVA with a small potential hemorrhage in basal ganglia Embolic-in the setting of New onset A-fib Workup as above No gross focal deficits on exam Currently on aspirin -Will switch to Eliquis in a few days per neurology recommendation Awaiting PT/OT eval to determine safe disposition  PAF Remains in A-fib Rate controlled Telemetry monitoring See above regarding plans for anticoagulation  Left distal clavicle fracture Sling/immobilizer in place No pain Outpatient orthopedic follow-up  HLD Lipitor dosage increased to 40 mg-this will be continued on discharge Continue Lovaza   History of mitral valve repair Valve appears stable on echo Outpatient cardiology follow-up.  HTN BP stable-amlodipine  on hold  Code status:   Code Status: Limited: Do not attempt resuscitation (DNR) -DNR-LIMITED -Do Not Intubate/DNI    DVT Prophylaxis: SCD's Start: 06/06/24 0724-hold off on pharmacological prophylaxis due to petechial hemorrhage in the basal ganglia.   Family Communication: Brenita Brazen [phone] called 9/27   Disposition Plan: Status is: Inpatient Remains inpatient appropriate because: Severity of illness   Planned Discharge Destination:Home health versus SNF depending on PT/OT eval.   Diet: Diet Order             Diet Heart Room service appropriate? Yes; Fluid consistency: Thin  Diet effective now  Antimicrobial agents: Anti-infectives (From admission, onward)    Start     Dose/Rate Route Frequency Ordered Stop   06/05/24 1945  cefTRIAXone  (ROCEPHIN ) 1 g in sodium chloride  0.9 % 100 mL IVPB        1 g 200 mL/hr over 30 Minutes Intravenous  Once 06/05/24 1941 06/05/24 2248   06/05/24 1945  azithromycin  (ZITHROMAX ) 500 mg in sodium  chloride 0.9 % 250 mL IVPB        500 mg 250 mL/hr over 60 Minutes Intravenous  Once 06/05/24 1941 06/06/24 0257        MEDICATIONS: Scheduled Meds:  aspirin   81 mg Oral Daily   atorvastatin   40 mg Oral Daily   [START ON 06/08/2024] Influenza vac split trivalent PF  0.5 mL Intramuscular Tomorrow-1000   omega-3 acid ethyl esters  1 g Oral BID   Continuous Infusions: PRN Meds:.acetaminophen  **OR** acetaminophen  (TYLENOL ) oral liquid 160 mg/5 mL **OR** acetaminophen , guaiFENesin -dextromethorphan    I have personally reviewed following labs and imaging studies  LABORATORY DATA: CBC: Recent Labs  Lab 06/05/24 1900 06/06/24 0907  WBC 13.4* 9.1  NEUTROABS 9.9*  --   HGB 14.5 13.4  HCT 42.9 39.6  MCV 89.7 91.9  PLT 272 102*    Basic Metabolic Panel: Recent Labs  Lab 06/05/24 1900 06/06/24 0907  NA 137 138  K 3.9 3.6  CL 99 103  CO2 24 19*  GLUCOSE 112* 97  BUN 13 8  CREATININE 0.76 0.65  CALCIUM  9.4 8.6*    GFR: Estimated Creatinine Clearance: 60.8 mL/min (by C-G formula based on SCr of 0.65 mg/dL).  Liver Function Tests: Recent Labs  Lab 06/05/24 1900 06/06/24 0907  AST 53* 34  ALT 23 22  ALKPHOS 45 40  BILITOT 1.8* 1.3*  PROT 7.2 6.3*  ALBUMIN 4.2 3.5   No results for input(s): LIPASE, AMYLASE in the last 168 hours. No results for input(s): AMMONIA in the last 168 hours.  Coagulation Profile: No results for input(s): INR, PROTIME in the last 168 hours.  Cardiac Enzymes: No results for input(s): CKTOTAL, CKMB, CKMBINDEX, TROPONINI in the last 168 hours.  BNP (last 3 results) No results for input(s): PROBNP in the last 8760 hours.  Lipid Profile: Recent Labs    06/06/24 0907  CHOL 183  HDL 43  LDLCALC 127*  TRIG 66  CHOLHDL 4.3    Thyroid Function Tests: No results for input(s): TSH, T4TOTAL, FREET4, T3FREE, THYROIDAB in the last 72 hours.  Anemia Panel: No results for input(s): VITAMINB12, FOLATE,  FERRITIN, TIBC, IRON, RETICCTPCT in the last 72 hours.  Urine analysis:    Component Value Date/Time   COLORURINE YELLOW 06/05/2024 1819   APPEARANCEUR CLEAR 06/05/2024 1819   LABSPEC 1.021 06/05/2024 1819   PHURINE 5.0 06/05/2024 1819   GLUCOSEU NEGATIVE 06/05/2024 1819   HGBUR NEGATIVE 06/05/2024 1819   BILIRUBINUR NEGATIVE 06/05/2024 1819   KETONESUR 20 (A) 06/05/2024 1819   PROTEINUR NEGATIVE 06/05/2024 1819   NITRITE NEGATIVE 06/05/2024 1819   LEUKOCYTESUR TRACE (A) 06/05/2024 1819    Sepsis Labs: Lactic Acid, Venous    Component Value Date/Time   LATICACIDVEN 1.5 06/05/2024 1907    MICROBIOLOGY: Recent Results (from the past 240 hours)  Culture, blood (routine x 2)     Status: None (Preliminary result)   Collection Time: 06/05/24  7:00 PM   Specimen: BLOOD  Result Value Ref Range Status   Specimen Description BLOOD LEFT ANTECUBITAL  Final   Special Requests  Final    BOTTLES DRAWN AEROBIC ONLY Blood Culture adequate volume   Culture   Final    NO GROWTH 2 DAYS Performed at [facility], [address], [place], [place] [address]    Report Status PENDING  Incomplete  Resp panel by RT-PCR (RSV, Flu A&B, Covid) Anterior Nasal Swab     Status: None   Collection Time: 06/05/24  7:43 PM   Specimen: Anterior Nasal Swab  Result Value Ref Range Status   SARS Coronavirus 2 by RT PCR NEGATIVE NEGATIVE Final   Influenza A by PCR NEGATIVE NEGATIVE Final   Influenza B by PCR NEGATIVE NEGATIVE Final    Comment: (NOTE) The Xpert Xpress SARS-CoV-2/FLU/RSV plus assay is intended as an aid in the diagnosis of influenza from Nasopharyngeal swab specimens and should not be used as a sole basis for treatment. Nasal washings and aspirates are unacceptable for Xpert Xpress SARS-CoV-2/FLU/RSV testing.  Fact Sheet for Patients: BloggerCourse.com  Fact Sheet for Healthcare Providers: SeriousBroker.it  This test is  not yet approved or cleared by the [place]  FDA and has been authorized for detection and/or diagnosis of SARS-CoV-2 by FDA under an Emergency Use Authorization (EUA). This EUA will remain in effect (meaning this test can be used) for the duration of the COVID-19 declaration under Section 564(b)(1) of the Act, 21 U.S.C. section 360bbb-3(b)(1), unless the authorization is terminated or revoked.     Resp Syncytial Virus by PCR NEGATIVE NEGATIVE Final    Comment: (NOTE) Fact Sheet for Patients: BloggerCourse.com  Fact Sheet for Healthcare Providers: SeriousBroker.it  This test is not yet approved or cleared by the [place]  FDA and has been authorized for detection and/or diagnosis of SARS-CoV-2 by FDA under an Emergency Use Authorization (EUA). This EUA will remain in effect (meaning this test can be used) for the duration of the COVID-19 declaration under Section 564(b)(1) of the Act, 21 U.S.C. section 360bbb-3(b)(1), unless the authorization is terminated or revoked.  Performed at [facility], [address], [place], [place] [address]   Culture, blood (routine x 2)     Status: None (Preliminary result)   Collection Time: 06/06/24  9:07 AM   Specimen: BLOOD RIGHT HAND  Result Value Ref Range Status   Specimen Description BLOOD RIGHT HAND  Final   Special Requests AEROBIC BOTTLE ONLY Blood Culture adequate volume  Final   Culture   Final    NO GROWTH < 24 HOURS Performed at [facility], [address] [place], [place] [address]    Report Status PENDING  Incomplete    RADIOLOGY STUDIES/RESULTS: ECHOCARDIOGRAM COMPLETE Result Date: 06/06/2024    ECHOCARDIOGRAM REPORT   Patient Name:   CLETIS CLACK Date of Exam: 06/06/2024 Medical Rec #:  [id number]     Height:       67.0 in Accession #:    [id number]    Weight:       155.0 lb Date of Birth:  [birth year]      BSA:          1.815 m Patient Age:    89 years       BP:           132/71 mmHg Patient Gender: M             HR:           84 bpm. Exam Location:  Inpatient Procedure: 2D Echo, Cardiac Doppler and Color Doppler (Both Spectral and  Color            Flow Doppler were utilized during procedure). Indications:    Stroke  History:        Patient has no prior history of Echocardiogram examinations.                 Risk Factors:Dyslipidemia and Hypertension.                  Mitral Valve: prosthetic annuloplasty ring valve is present in                 the mitral position. Procedure Date: 10/17/2005.  Sonographer:    Philomena Daring Referring Phys: [id number] [facility] KHALIQDINA IMPRESSIONS  1. Left ventricular ejection fraction, by estimation, is 60 to 65%. The left ventricle has normal function. The left ventricle has no regional wall motion abnormalities.  2. Right ventricular systolic function is normal. The right ventricular size is normal.  3. Left atrial size was mildly dilated.  4. Right atrial size was mildly dilated.  5. The mitral valve has been repaired/replaced. No evidence of mitral valve regurgitation. No evidence of mitral stenosis. There is a prosthetic annuloplasty ring present in the mitral position. Procedure Date: 10/17/2005.  6. The aortic valve is normal in structure. Aortic valve regurgitation is mild. No aortic stenosis is present.  7. The inferior vena cava is normal in size with greater than 50% respiratory variability, suggesting right atrial pressure of 3 mmHg. Conclusion(s)/Recommendation(s): No intracardiac source of embolism detected on this transthoracic study. Consider a transesophageal echocardiogram to exclude cardiac source of embolism if clinically indicated. FINDINGS  Left Ventricle: Left ventricular ejection fraction, by estimation, is 60 to 65%. The left ventricle has normal function. The left ventricle has no regional wall motion abnormalities. The left ventricular internal cavity size was normal in size. There is  no left ventricular  hypertrophy. Right Ventricle: The right ventricular size is normal. No increase in right ventricular wall thickness. Right ventricular systolic function is normal. Left Atrium: Left atrial size was mildly dilated. Right Atrium: Right atrial size was mildly dilated. Pericardium: There is no evidence of pericardial effusion. Mitral Valve: The mitral valve has been repaired/replaced. No evidence of mitral valve regurgitation. There is a prosthetic annuloplasty ring present in the mitral position. Procedure Date: 10/17/2005. No evidence of mitral valve stenosis. Tricuspid Valve: The tricuspid valve is normal in structure. Tricuspid valve regurgitation is mild . No evidence of tricuspid stenosis. Aortic Valve: The aortic valve is normal in structure. Aortic valve regurgitation is mild. No aortic stenosis is present. Pulmonic Valve: The pulmonic valve was normal in structure. Pulmonic valve regurgitation is not visualized. No evidence of pulmonic stenosis. Aorta: The aortic root is normal in size and structure. Venous: The inferior vena cava is normal in size with greater than 50% respiratory variability, suggesting right atrial pressure of 3 mmHg. IAS/Shunts: No atrial level shunt detected by color flow Doppler.  LEFT VENTRICLE PLAX 2D LVIDd:         4.90 cm   Diastology LVIDs:         3.50 cm   LV e' medial:    6.64 cm/s LV PW:         1.10 cm   LV E/e' medial:  22.3 LV IVS:        1.40 cm   LV e' lateral:   7.07 cm/s LVOT diam:     2.20 cm   LV E/e' lateral: 20.9 LV SV:  52 LV SV Index:   29 LVOT Area:     3.80 cm  RIGHT VENTRICLE RV S prime:     9.46 cm/s TAPSE (M-mode): 1.6 cm LEFT ATRIUM             Index        RIGHT ATRIUM           Index LA Vol (A2C):   60.1 ml 33.12 ml/m  RA Area:     22.30 cm LA Vol (A4C):   64.3 ml 35.44 ml/m  RA Volume:   64.20 ml  35.38 ml/m LA Biplane Vol: 62.8 ml 34.61 ml/m  AORTIC VALVE LVOT Vmax:   81.60 cm/s LVOT Vmean:  50.700 cm/s LVOT VTI:    0.137 m  AORTA Ao Root diam:  2.80 cm Ao Asc diam:  2.90 cm MITRAL VALVE                TRICUSPID VALVE MV Area (PHT): 2.21 cm     TR Peak grad:   14.4 mmHg MV Decel Time: 344 msec     TR Vmax:        190.00 cm/s MV E velocity: 148.00 cm/s                             SHUNTS                             Systemic VTI:  0.14 m                             Systemic Diam: 2.20 cm Oneil Parchment MD Electronically signed by Oneil Parchment MD Signature Date/Time: 06/06/2024/1:20:44 PM    Final    MR BRAIN WO CONTRAST Result Date: 06/06/2024 EXAM: MRI BRAIN WITHOUT CONTRAST 06/06/2024 05:21:59 AM TECHNIQUE: Multiplanar multisequence MRI of the head/brain was performed without the administration of intravenous contrast. COMPARISON: CT of the head dated 06/05/2024 and MRI of the head dated 11/10/2005. CLINICAL HISTORY: Neuro deficit, acute, stroke suspected. FINDINGS: BRAIN AND VENTRICLES: Restricted diffusion present within the right basal ganglia compatible with acute nonhemorrhagic infarct. Restricted diffusion present within the right parietal lobe cortex compatible with small embolic infarcts. Blooming artifact present within the right basal ganglia, suggesting petechial hemorrhage. Focus of hemosiderin staining present laterally within the left cerebellar hemisphere. Moderate periventricular white matter disease. No mass. No midline shift. No hydrocephalus. No significant cerebral swelling or mass effect. The sella is unremarkable. Normal flow voids. ORBITS: No acute abnormality. SINUSES AND MASTOIDS: No acute abnormality. BONES AND SOFT TISSUES: Normal marrow signal. No acute soft tissue abnormality. IMPRESSION: 1. Acute infarct in the right basal ganglia with associated petechial hemorrhage. 2. Small acute embolic infarcts in the right parietal cortex. 3. No significant cerebral swelling or mass effect. 4. Moderate chronic periventricular white matter disease. Electronically signed by: Evalene Coho MD 06/06/2024 05:59 AM EDT RP Workstation:  [id number]   CT ANGIO HEAD NECK W WO CM Result Date: 06/06/2024 EXAM: CTA HEAD AND NECK WITH AND WITHOUT 06/06/2024 04:41:43 AM TECHNIQUE: CTA of the head and neck was performed with and without the administration of 75 mL iohexol  (OMNIPAQUE ) 350 MG/ML injection. Multiplanar 2D and/or 3D reformatted images are provided for review. Automated exposure control, iterative reconstruction, and/or weight based adjustment of the mA/kV was utilized to reduce the radiation dose to as low  as reasonably achievable. Stenosis of the internal carotid arteries measured using NASCET criteria. COMPARISON: None available CLINICAL HISTORY: Neuro deficit, acute, stroke suspected. FINDINGS: CTA NECK: AORTIC ARCH AND ARCH VESSELS: There is mild-to-moderate calcific atheromatous disease within the aortic arch. No dissection or arterial injury. No significant stenosis of the brachiocephalic or subclavian arteries. CERVICAL CAROTID ARTERIES: There is calcific atheromatous disease within the carotid bulbs bilaterally, with less than 20% luminal stenosis bilaterally. No dissection or arterial injury. CERVICAL VERTEBRAL ARTERIES: The vertebral arteries are codominant and normal in caliber throughout the respective courses. No dissection, arterial injury, or significant stenosis. LUNGS AND MEDIASTINUM: Unremarkable. SOFT TISSUES: No acute abnormality. BONES: No acute abnormality. CTA HEAD: ANTERIOR CIRCULATION: No significant stenosis of the internal carotid arteries. No significant stenosis of the anterior cerebral arteries. No significant stenosis of the middle cerebral arteries. No aneurysm. POSTERIOR CIRCULATION: There is fetal type origin of the posterior cerebral arteries bilaterally. No significant stenosis of the posterior cerebral arteries. No significant stenosis of the basilar artery. No significant stenosis of the vertebral arteries. No aneurysm. OTHER: No dural venous sinus thrombosis on this non-dedicated study. IMPRESSION: 1.  No large vessel occlusion or aneurysm in the head or neck. 2. Bilateral carotid bulb atherosclerosis with less than 20% luminal stenosis. 3. Fetal-type origin of both posterior cerebral arteries (anatomic variant). Electronically signed by: Evalene Coho MD 06/06/2024 05:54 AM EDT RP Workstation: [id number]   CT Head Wo Contrast Addendum Date: 06/05/2024 ADDENDUM REPORT: 06/05/2024 21:46 ADDENDUM: These results were called by telephone at the time of interpretation on 06/05/2024 at 9:46 pm to provider [facility] , who verbally acknowledged these results. Electronically Signed   By: Morgane  Naveau [name]   On: 06/05/2024 21:46   Result Date: 06/05/2024 CLINICAL DATA:  Head trauma, minor (Age >= 65y); Neck trauma (Age >= 65y). Altered mental status. EXAM: CT HEAD WITHOUT CONTRAST CT CERVICAL SPINE WITHOUT CONTRAST TECHNIQUE: Multidetector CT imaging of the head and cervical spine was performed following the standard protocol without intravenous contrast. Multiplanar CT image reconstructions of the cervical spine were also generated. RADIATION DOSE REDUCTION: This exam was performed according to the departmental dose-optimization program which includes automated exposure control, adjustment of the mA and/or kV according to patient size and/or use of iterative reconstruction technique. COMPARISON:  None Available. FINDINGS: CT HEAD FINDINGS Brain: Cerebral ventricle sizes are concordant with the degree of cerebral volume loss. Patchy and confluent areas of decreased attenuation are noted throughout the deep and periventricular white matter of the cerebral hemispheres bilaterally, compatible with chronic microvascular ischemic disease. Loss of gray-white matter differentiation of the right basal ganglia with associated vasogenic edema and mild mass effect. No parenchymal hemorrhage. Few scattered 1-3 mm cerebral and cerebellar calcifications. No mass lesion. No extra-axial collection. No midline shift. No  hydrocephalus. Basilar cisterns are patent. Vascular: No hyperdense vessel. Skull: No acute fracture or focal lesion. Sinuses/Orbits: Paranasal sinuses and mastoid air cells are clear. The. Otherwise the orbits are unremarkable. Other: None. CT CERVICAL SPINE FINDINGS Alignment: Normal. Skull base and vertebrae: Diffusely decreased bone density. Multilevel mild degenerative changes of the spine. No acute fracture. No aggressive appearing focal osseous lesion or focal pathologic process. Soft tissues and spinal canal: No prevertebral fluid or swelling. No visible canal hematoma. Upper chest: A paraseptal and centrilobular emphysematous changes. Other: Atherosclerotic plaque of the aortic arch and its main branches. Atherosclerotic plaque of the carotid arteries within the neck. IMPRESSION: 1. Acute to early subacute right basal ganglia infarction. 2. No  acute intracranial hemorrhage. 3. No acute displaced fracture or traumatic listhesis of the cervical spine. 4. Aortic Atherosclerosis (ICD10-I70.0) and Emphysema (ICD10-J43.9). Electronically Signed: By: Morgane  Naveau [name] On: 06/05/2024 21:42   CT Cervical Spine Wo Contrast Addendum Date: 06/05/2024 ADDENDUM REPORT: 06/05/2024 21:46 ADDENDUM: These results were called by telephone at the time of interpretation on 06/05/2024 at 9:46 pm to provider [facility] , who verbally acknowledged these results. Electronically Signed   By: Morgane  Naveau [name]   On: 06/05/2024 21:46   Result Date: 06/05/2024 CLINICAL DATA:  Head trauma, minor (Age >= 65y); Neck trauma (Age >= 65y). Altered mental status. EXAM: CT HEAD WITHOUT CONTRAST CT CERVICAL SPINE WITHOUT CONTRAST TECHNIQUE: Multidetector CT imaging of the head and cervical spine was performed following the standard protocol without intravenous contrast. Multiplanar CT image reconstructions of the cervical spine were also generated. RADIATION DOSE REDUCTION: This exam was performed according to the departmental  dose-optimization program which includes automated exposure control, adjustment of the mA and/or kV according to patient size and/or use of iterative reconstruction technique. COMPARISON:  None Available. FINDINGS: CT HEAD FINDINGS Brain: Cerebral ventricle sizes are concordant with the degree of cerebral volume loss. Patchy and confluent areas of decreased attenuation are noted throughout the deep and periventricular white matter of the cerebral hemispheres bilaterally, compatible with chronic microvascular ischemic disease. Loss of gray-white matter differentiation of the right basal ganglia with associated vasogenic edema and mild mass effect. No parenchymal hemorrhage. Few scattered 1-3 mm cerebral and cerebellar calcifications. No mass lesion. No extra-axial collection. No midline shift. No hydrocephalus. Basilar cisterns are patent. Vascular: No hyperdense vessel. Skull: No acute fracture or focal lesion. Sinuses/Orbits: Paranasal sinuses and mastoid air cells are clear. The. Otherwise the orbits are unremarkable. Other: None. CT CERVICAL SPINE FINDINGS Alignment: Normal. Skull base and vertebrae: Diffusely decreased bone density. Multilevel mild degenerative changes of the spine. No acute fracture. No aggressive appearing focal osseous lesion or focal pathologic process. Soft tissues and spinal canal: No prevertebral fluid or swelling. No visible canal hematoma. Upper chest: A paraseptal and centrilobular emphysematous changes. Other: Atherosclerotic plaque of the aortic arch and its main branches. Atherosclerotic plaque of the carotid arteries within the neck. IMPRESSION: 1. Acute to early subacute right basal ganglia infarction. 2. No acute intracranial hemorrhage. 3. No acute displaced fracture or traumatic listhesis of the cervical spine. 4. Aortic Atherosclerosis (ICD10-I70.0) and Emphysema (ICD10-J43.9). Electronically Signed: By: Morgane  Naveau [name] On: 06/05/2024 21:42   CT CHEST ABDOMEN PELVIS W  CONTRAST Result Date: 06/05/2024 CLINICAL DATA:  Polytrauma, blunt AMS, ?sepsis. question sepsis. EXAM: CT CHEST, ABDOMEN, AND PELVIS WITH CONTRAST TECHNIQUE: Multidetector CT imaging of the chest, abdomen and pelvis was performed following the standard protocol during bolus administration of intravenous contrast. RADIATION DOSE REDUCTION: This exam was performed according to the departmental dose-optimization program which includes automated exposure control, adjustment of the mA and/or kV according to patient size and/or use of iterative reconstruction technique. CONTRAST:  65mL OMNIPAQUE  IOHEXOL  350 MG/ML SOLN COMPARISON:  11/10/2005 FINDINGS: CT CHEST FINDINGS Cardiovascular: Heart is borderline in size. Aorta normal caliber. Aortic atherosclerosis. Mediastinum/Nodes: No mediastinal, hilar, or axillary adenopathy. Trachea and esophagus are unremarkable. Thyroid unremarkable. Lungs/Pleura: No confluent airspace opacities or effusions. No pneumothorax. Musculoskeletal: Chest wall soft tissues are unremarkable. No acute bony abnormality. CT ABDOMEN PELVIS FINDINGS Hepatobiliary: No focal hepatic abnormality. Gallbladder unremarkable. Pancreas: No focal abnormality or ductal dilatation. Spleen: No focal abnormality.  Normal size. Adrenals/Urinary Tract: Stable left adrenal nodule  compatible with adenoma. Right adrenal gland normal. 5.3 cm cyst in the upper pole of the right kidney appears simple. No follow-up imaging recommended. No hydronephrosis. Stomach/Bowel: Left colonic diverticulosis. No active diverticulitis. Stomach and small bowel decompressed. No bowel obstruction or inflammatory process. Vascular/Lymphatic: Aortic atherosclerosis. No evidence of aneurysm or adenopathy. Reproductive: No visible focal abnormality. Other: No free fluid or free air. Musculoskeletal: No acute bony abnormality. IMPRESSION: No acute findings or significant traumatic injury in the chest, abdomen or pelvis. Aortic  atherosclerosis. Left colonic diverticulosis. Electronically Signed   By: Franky Crease [name]   On: 06/05/2024 21:40   DG Shoulder Left Result Date: 06/05/2024 CLINICAL DATA:  Left shoulder pain after a fall.  Abrasions. EXAM: DG SHOULDER 2+V*L* COMPARISON:  None Available. FINDINGS: Transverse mildly comminuted fracture of the distal left clavicle. Fracture does not appear to involve the acromioclavicular joint. No significant displacement. The glenohumeral joint appears intact without additional fracture or dislocation. Degenerative changes in the glenohumeral and acromioclavicular joints. No focal bone lesions identified. Soft tissues are unremarkable. IMPRESSION: Transverse nondisplaced fracture of the distal left clavicle. Electronically Signed   By: Elsie Gravely [name]   On: 06/05/2024 19:31     LOS: 1 day   Donalda Applebaum, MD  [facility]    To contact the attending provider between 7A-7P or the covering provider during after hours 7P-7A, please log into the web site [email or web address] and access using universal [place] password for that web site. If you do not have the password, please call the hospital operator.  06/07/2024, 9:15 AM

## 2024-06-07 NOTE — Evaluation (Signed)
 Physical Therapy Evaluation Patient Details Name: Kyle [name] MRN: [id number] DOB: Oct 11, 1934 Today's Date: 06/07/2024  History of Present Illness  Patient is a 89 y.o.  male who presented with a fall-subsequent imaging studies demonstrated a left shoulder fracture and a acute CVA. Past medical history of mitral valve repair with postoperative A-fib in 2006, CVA in 2007, CAD, HTN, HLD.  Clinical Impression  Pt presents with admitting diagnosis above. Pt today was able to ambulate in hallway with HHA Min A. Pt noted with very narrow ataxic gait pattern. PTA pt was fully independent and lives alone however has great support nearby. Patient will benefit from intensive inpatient follow-up therapy, >3 hours/day. PT will continue to follow.          If plan is discharge home, recommend the following: A little help with walking and/or transfers;A lot of help with bathing/dressing/bathroom;Assistance with cooking/housework;Direct supervision/assist for medications management;Direct supervision/assist for financial management;Assist for transportation;Help with stairs or ramp for entrance;Supervision due to cognitive status   Can travel by private vehicle        Equipment Recommendations Other (comment) (Per accepting facility)  Recommendations for Other Services  Rehab consult    Functional Status Assessment Patient has had a recent decline in their functional status and demonstrates the ability to make significant improvements in function in a reasonable and predictable amount of time.     Precautions / Restrictions Precautions Precautions: Fall Recall of Precautions/Restrictions: Impaired Restrictions Weight Bearing Restrictions Per Provider Order: No      Mobility  Bed Mobility Overal bed mobility: Needs Assistance Bed Mobility: Sit to Supine       Sit to supine: Min assist, +2 for physical assistance   General bed mobility comments: Min A to assist trunk back to bed due to  shoulder fx.    Transfers Overall transfer level: Needs assistance Equipment used: 1 person hand held assist Transfers: Sit to/from Stand Sit to Stand: Min assist           General transfer comment: Min A to power up.    Ambulation/Gait Ambulation/Gait assistance: Min assist Gait Distance (Feet): 75 Feet Assistive device: 1 person hand held assist Gait Pattern/deviations: Ataxic, Narrow base of support, Decreased stride length, Step-through pattern Gait velocity: decreased     General Gait Details: Pt noted with very narrow ataxic gait pattern. no LOB noted.  Stairs            Wheelchair Mobility     Tilt Bed    Modified Rankin (Stroke Patients Only) Modified Rankin (Stroke Patients Only) Pre-Morbid Rankin Score: No symptoms Modified Rankin: Slight disability     Balance Overall balance assessment: Needs assistance Sitting-balance support: Bilateral upper extremity supported, Feet supported Sitting balance-Leahy Scale: Good     Standing balance support: Bilateral upper extremity supported, During functional activity Standing balance-Leahy Scale: Poor Standing balance comment: Reliant on HHA                             Pertinent Vitals/Pain Pain Assessment Pain Assessment: 0-10 Pain Score: 10-Worst pain ever Pain Location: L shoulder Pain Descriptors / Indicators: Sore Pain Intervention(s): Monitored during session, Limited activity within patient's tolerance, Repositioned    Home Living Family/patient expects to be discharged to:: Private residence Living Arrangements: Alone Available Help at Discharge: Family;Friend(s);Available PRN/intermittently Type of Home: House Home Access: Ramped entrance       Home Layout: One level Home Equipment: None Additional Comments:  Wife passed 2 years ago however since stroke he thinks she is still around.    Prior Function Prior Level of Function : Independent/Modified Independent;History of  Falls (last six months);Driving             Mobility Comments: Ind ADLs Comments: Ind     Extremity/Trunk Assessment   Upper Extremity Assessment Upper Extremity Assessment: LUE deficits/detail LUE Deficits / Details: L shoulder fx    Lower Extremity Assessment Lower Extremity Assessment: Generalized weakness    Cervical / Trunk Assessment Cervical / Trunk Assessment: Kyphotic  Communication   Communication Communication: No apparent difficulties    Cognition Arousal: Alert Behavior During Therapy: WFL for tasks assessed/performed   PT - Cognitive impairments: Orientation, Safety/Judgement   Orientation impairments: Situation                   PT - Cognition Comments: Pt appears to be somewhat confused. Per family in room, since the stroke pt believes he deceased wife is still around. Following commands: Intact       Cueing Cueing Techniques: Verbal cues, Tactile cues     General Comments General comments (skin integrity, edema, etc.): Pt noted to be in Afib for entirity of session.    Exercises     Assessment/Plan    PT Assessment Patient needs continued PT services  PT Problem List Decreased strength;Decreased range of motion;Decreased balance;Decreased activity tolerance;Decreased mobility;Decreased coordination;Decreased cognition;Decreased knowledge of use of DME;Decreased knowledge of precautions;Decreased safety awareness;Cardiopulmonary status limiting activity       PT Treatment Interventions DME instruction;Gait training;Stair training;Therapeutic activities;Functional mobility training;Therapeutic exercise;Balance training;Neuromuscular re-education;Cognitive remediation;Patient/family education    PT Goals (Current goals can be found in the Care Plan section)  Acute Rehab PT Goals Patient Stated Goal: to go home PT Goal Formulation: With patient Time For Goal Achievement: 06/21/24 Potential to Achieve Goals: Good    Frequency Min  3X/week     Co-evaluation               AM-PAC PT 6 Clicks Mobility  Outcome Measure Help needed turning from your back to your side while in a flat bed without using bedrails?: A Little Help needed moving from lying on your back to sitting on the side of a flat bed without using bedrails?: A Little Help needed moving to and from a bed to a chair (including a wheelchair)?: A Little Help needed standing up from a chair using your arms (e.g., wheelchair or bedside chair)?: A Little Help needed to walk in hospital room?: A Little Help needed climbing 3-5 steps with a railing? : A Lot 6 Click Score: 17    End of Session Equipment Utilized During Treatment: Gait belt Activity Tolerance: Patient tolerated treatment well Patient left: in bed;with call bell/phone within reach;with bed alarm set;with family/visitor present Nurse Communication: Mobility status PT Visit Diagnosis: Other abnormalities of gait and mobility (R26.89)    Time: 8653-8586 PT Time Calculation (min) (ACUTE ONLY): 27 min   Charges:   PT Evaluation $PT Eval Moderate Complexity: 1 Mod PT Treatments $Gait Training: 8-22 mins PT General Charges $$ ACUTE PT VISIT: 1 Visit         Sueellen NOVAK, PT, DPT Acute Rehab Services [phone]   Kyle [name] 06/07/2024, 3:43 PM

## 2024-06-08 ENCOUNTER — Other Ambulatory Visit (HOSPITAL_COMMUNITY): Payer: Self-pay

## 2024-06-08 DIAGNOSIS — E782 Mixed hyperlipidemia: Secondary | ICD-10-CM | POA: Diagnosis not present

## 2024-06-08 DIAGNOSIS — I6340 Cerebral infarction due to embolism of unspecified cerebral artery: Secondary | ICD-10-CM | POA: Diagnosis not present

## 2024-06-08 DIAGNOSIS — S42019S Nondisplaced fracture of sternal end of unspecified clavicle, sequela: Secondary | ICD-10-CM | POA: Diagnosis not present

## 2024-06-08 DIAGNOSIS — I639 Cerebral infarction, unspecified: Secondary | ICD-10-CM | POA: Diagnosis not present

## 2024-06-08 DIAGNOSIS — E785 Hyperlipidemia, unspecified: Secondary | ICD-10-CM | POA: Diagnosis not present

## 2024-06-08 DIAGNOSIS — R233 Spontaneous ecchymoses: Secondary | ICD-10-CM | POA: Diagnosis not present

## 2024-06-08 DIAGNOSIS — I480 Paroxysmal atrial fibrillation: Secondary | ICD-10-CM | POA: Diagnosis not present

## 2024-06-08 DIAGNOSIS — I4891 Unspecified atrial fibrillation: Secondary | ICD-10-CM | POA: Diagnosis not present

## 2024-06-08 MED ORDER — INFLUENZA VAC SPLIT HIGH-DOSE 0.5 ML IM SUSY
0.5000 mL | PREFILLED_SYRINGE | INTRAMUSCULAR | Status: AC
  Administered 2024-06-09: 0.5 mL via INTRAMUSCULAR
  Filled 2024-06-08: qty 0.5

## 2024-06-08 MED ORDER — MELATONIN 5 MG PO TABS
5.0000 mg | ORAL_TABLET | Freq: Every day | ORAL | Status: DC
  Administered 2024-06-08 – 2024-06-09 (×2): 5 mg via ORAL
  Filled 2024-06-08 (×2): qty 1

## 2024-06-08 MED ORDER — HALOPERIDOL LACTATE 5 MG/ML IJ SOLN: 2.0000 mg | Freq: Four times a day (QID) | INTRAMUSCULAR | Status: DC | PRN

## 2024-06-08 MED ORDER — MELATONIN 3 MG PO TABS
9.0000 mg | ORAL_TABLET | Freq: Once | ORAL | Status: AC
  Administered 2024-06-08: 9 mg via ORAL
  Filled 2024-06-08: qty 3

## 2024-06-08 MED ORDER — APIXABAN 5 MG PO TABS
5.0000 mg | ORAL_TABLET | Freq: Two times a day (BID) | ORAL | Status: DC
  Administered 2024-06-08 – 2024-06-10 (×5): 5 mg via ORAL
  Filled 2024-06-08 (×3): qty 1
  Filled 2024-06-08: qty 2
  Filled 2024-06-08: qty 1

## 2024-06-08 NOTE — Progress Notes (Addendum)
 PHARMACY - ANTICOAGULATION CONSULT NOTE  Pharmacy Consult for Eliquis Indication: atrial fibrillation  Allergies  Allergen Reactions   Penicillins     REACTION: dizziness/syncope    Patient Measurements: Height: 5' 7 (170.2 cm) Weight: 70.3 kg (154 lb 15.7 oz) IBW/kg (Calculated) : 66.1 HEPARIN DW (KG): 70.3  Vital Signs: Temp: 97.7 F (36.5 C) (09/28 0816) Temp Source: Oral (09/28 0816) BP: 122/70 (09/28 0816) Pulse Rate: 82 (09/28 0816)  Labs: Recent Labs    06/05/24 1900 06/06/24 0907  HGB 14.5 13.4  HCT 42.9 39.6  PLT 272 102*  CREATININE 0.76 0.65  TROPONINIHS  --  24*    Estimated Creatinine Clearance: 60.8 mL/min (by C-G formula based on SCr of 0.65 mg/dL).   Medical History: Past Medical History:  Diagnosis Date   Atrial fibrillation [facility])    Post operative, maintaining normal sinus rhythm on amiodarone   Benign prostatic hypertrophy    CAD (coronary artery disease)    Non obstructive by catheterization Feb 2007 prior to his surgery revealing a 40-50% LAD lesion, good LV function with EF 55-60% by last echocardiogram prior to his surgery   Hyperlipidemia    Treated   Mitral regurgitation    S/P mitral valve repair Oct 17, 2005 by Dr. Dusty   Skin cancer     Medications:  Medications Prior to Admission  Medication Sig Dispense Refill Last Dose/Taking   amLODipine  (NORVASC ) 5 MG tablet Take 1 tablet (5 mg total) by mouth daily. 90 tablet 3 06/05/2024 Morning   aspirin  81 MG tablet Take 81 mg by mouth daily.     06/05/2024 Morning   atorvastatin  (LIPITOR) 20 MG tablet Take 1 tablet (20 mg total) by mouth daily. 90 tablet 3 06/05/2024 Morning   fish oil-omega-3 fatty acids  1000 MG capsule Take 1 capsule by mouth 2 (two) times daily.   06/05/2024 Morning   Glucosamine-Chondroitin (OSTEO BI-FLEX REGULAR STRENGTH PO) Take 1 tablet by mouth in the morning and at bedtime.   06/05/2024 Morning   Aspirin -Acetaminophen -Caffeine (GOODYS EXTRA STRENGTH PO) Take by  mouth as needed. Arthritis   Unknown    Assessment: 41 yoM with CVA in the setting of Afib not on anticoagulation. PMH of mitral valve repair with postoperative A-fib in 2006 no longer on anticoagulation (previously on warfarin), CVA in 2007, CAD, HTN, HLD. Pt on aspirin  81mg  daily PTA.   Pt presented due to recent fall and was found to have a CVA and afib.   Plan to switch from aspirin  to apixaban 5 mg BID.  9/26 Hgb of 13.4 and PLT 103.  Eliquis copay would be $302 after 30 day free copay card, per [facility] pharmacy.  Plan:  Stop aspirin  81 mg daily Start Eliquis 5 mg BID Monitor CBC  Prentice DOROTHA Favors, PharmD PGY1 Health-System Pharmacy Administration and Leadership Resident [facility]  06/08/2024 8:58 AM

## 2024-06-08 NOTE — Discharge Instructions (Addendum)

## 2024-06-08 NOTE — Progress Notes (Signed)
 PROGRESS NOTE        PATIENT DETAILS Name: Kyle [name] Age: 89 y.o. Sex: male Date of Birth: 02-17-1935 Admit Date: 06/05/2024 Admitting Physician Editha Ram, MD ERE:Izuupwhzm, Fonda LABOR, MD  Brief Summary: Patient is a 89 y.o.  male with history of mitral valve repair with postoperative A-fib in 2006, CVA in 2007, CAD, HTN, HLD who presented with a fall-subsequent imaging studies demonstrated a left shoulder fracture and a acute CVA.  Significant events: 9/26>> admit to [facility]  Significant studies: 9/25>> x-ray left shoulder: Transverse nondisplaced fracture of the distal left clavicle. 9/25>> CT head: Subacute right basal ganglia infarct. 9/25>> CT chest/abdomen/pelvis: No acute findings. 9/26>> CT angio head/neck: No LVO/aneurysm in head/neck. 9/26>> MRI brain: Acute infarct right basal ganglia with associated petechial hemorrhage, small acute infarcts in the right parietal cortex. 9/26>> echo: EF 60-65% 9/26>> A1c: 5.4 9/26>> LDL: 127  Significant microbiology data: 9/25>> COVID/influenza/RSV PCR: Negative  Procedures: None  Consults: Neurology  Subjective: No complaints-lying comfortably in bed.  Objective: Vitals: Blood pressure 122/70, pulse 82, temperature 97.7 F (36.5 C), temperature source Oral, resp. rate 19, height 5' 7 (1.702 m), weight 70.3 kg, SpO2 94%.   Exam: Gen Exam:Alert awake-not in any distress HEENT:atraumatic, normocephalic Chest: B/L clear to auscultation anteriorly CVS:S1S2 regular Abdomen:soft non tender, non distended Extremities:no edema Neurology: Non focal Skin: no rash  Pertinent Labs/Radiology:    Latest Ref Rng & Units 06/06/2024    9:07 AM 06/05/2024    7:00 PM 12/05/2023    8:27 AM  CBC  WBC 4.0 - 10.5 K/uL 9.1  13.4  7.2   Hemoglobin 13.0 - 17.0 g/dL 86.5  85.4  86.2   Hematocrit 39.0 - 52.0 % 39.6  42.9  40.9   Platelets 150 - 400 K/uL 102  272  257     Lab Results  Component Value  Date   NA 138 06/06/2024   K 3.6 06/06/2024   CL 103 06/06/2024   CO2 19 (L) 06/06/2024      Assessment/Plan: Acute CVA with a small potential hemorrhage in basal ganglia Embolic-in the setting of New onset A-fib Workup as above Exam remains unchanged-nonfocal Per neurology-okay to start Eliquis today CIR consultation placed per PT/OT recommendations.  PAF Remains in A-fib Rate controlled Telemetry monitoring Now on Eliquis.  Left distal clavicle fracture Sling/immobilizer in place No pain Outpatient orthopedic follow-up  HLD Lipitor dosage increased to 40 mg-this will be continued on discharge Continue Lovaza   History of mitral valve repair Valve appears stable on echo Outpatient cardiology follow-up.  HTN BP stable-amlodipine  on hold  Hospital delirium Appears mild Supportive care Add melatonin nightly Delirium precautions.  Code status:   Code Status: Limited: Do not attempt resuscitation (DNR) -DNR-LIMITED -Do Not Intubate/DNI    DVT Prophylaxis: SCD's Start: 06/06/24 0724-hold off on pharmacological prophylaxis due to petechial hemorrhage in the basal ganglia. apixaban (ELIQUIS) tablet 5 mg    Family Communication: Daughter-[name] Lynnea [phone] called 9/28   Disposition Plan: Status is: Inpatient Remains inpatient appropriate because: Severity of illness   Planned Discharge Destination:CIR   Diet: Diet Order             Diet Heart Room service appropriate? Yes; Fluid consistency: Thin  Diet effective now  Antimicrobial agents: Anti-infectives (From admission, onward)    Start     Dose/Rate Route Frequency Ordered Stop   06/05/24 1945  cefTRIAXone  (ROCEPHIN ) 1 g in sodium chloride  0.9 % 100 mL IVPB        1 g 200 mL/hr over 30 Minutes Intravenous  Once 06/05/24 1941 06/05/24 2248   06/05/24 1945  azithromycin  (ZITHROMAX ) 500 mg in sodium chloride  0.9 % 250 mL IVPB        500 mg 250 mL/hr over 60 Minutes  Intravenous  Once 06/05/24 1941 06/06/24 0257        MEDICATIONS: Scheduled Meds:  apixaban  5 mg Oral BID   atorvastatin   40 mg Oral Daily   famotidine  20 mg Oral Daily   Influenza vac split trivalent PF  0.5 mL Intramuscular Tomorrow-1000   omega-3 acid ethyl esters  1 g Oral BID   Continuous Infusions: PRN Meds:.acetaminophen  **OR** acetaminophen  (TYLENOL ) oral liquid 160 mg/5 mL **OR** acetaminophen , guaiFENesin -dextromethorphan    I have personally reviewed following labs and imaging studies  LABORATORY DATA: CBC: Recent Labs  Lab 06/05/24 1900 06/06/24 0907  WBC 13.4* 9.1  NEUTROABS 9.9*  --   HGB 14.5 13.4  HCT 42.9 39.6  MCV 89.7 91.9  PLT 272 102*    Basic Metabolic Panel: Recent Labs  Lab 06/05/24 1900 06/06/24 0907  NA 137 138  K 3.9 3.6  CL 99 103  CO2 24 19*  GLUCOSE 112* 97  BUN 13 8  CREATININE 0.76 0.65  CALCIUM  9.4 8.6*    GFR: Estimated Creatinine Clearance: 60.8 mL/min (by C-G formula based on SCr of 0.65 mg/dL).  Liver Function Tests: Recent Labs  Lab 06/05/24 1900 06/06/24 0907  AST 53* 34  ALT 23 22  ALKPHOS 45 40  BILITOT 1.8* 1.3*  PROT 7.2 6.3*  ALBUMIN 4.2 3.5   No results for input(s): LIPASE, AMYLASE in the last 168 hours. No results for input(s): AMMONIA in the last 168 hours.  Coagulation Profile: No results for input(s): INR, PROTIME in the last 168 hours.  Cardiac Enzymes: No results for input(s): CKTOTAL, CKMB, CKMBINDEX, TROPONINI in the last 168 hours.  BNP (last 3 results) No results for input(s): PROBNP in the last 8760 hours.  Lipid Profile: Recent Labs    06/06/24 0907  CHOL 183  HDL 43  LDLCALC 127*  TRIG 66  CHOLHDL 4.3    Thyroid Function Tests: No results for input(s): TSH, T4TOTAL, FREET4, T3FREE, THYROIDAB in the last 72 hours.  Anemia Panel: No results for input(s): VITAMINB12, FOLATE, FERRITIN, TIBC, IRON, RETICCTPCT in the last 72  hours.  Urine analysis:    Component Value Date/Time   COLORURINE YELLOW 06/05/2024 1819   APPEARANCEUR CLEAR 06/05/2024 1819   LABSPEC 1.021 06/05/2024 1819   PHURINE 5.0 06/05/2024 1819   GLUCOSEU NEGATIVE 06/05/2024 1819   HGBUR NEGATIVE 06/05/2024 1819   BILIRUBINUR NEGATIVE 06/05/2024 1819   KETONESUR 20 (A) 06/05/2024 1819   PROTEINUR NEGATIVE 06/05/2024 1819   NITRITE NEGATIVE 06/05/2024 1819   LEUKOCYTESUR TRACE (A) 06/05/2024 1819    Sepsis Labs: Lactic Acid, Venous    Component Value Date/Time   LATICACIDVEN 1.5 06/05/2024 1907    MICROBIOLOGY: Recent Results (from the past 240 hours)  Culture, blood (routine x 2)     Status: None (Preliminary result)   Collection Time: 06/05/24  7:00 PM   Specimen: BLOOD  Result Value Ref Range Status   Specimen Description BLOOD LEFT ANTECUBITAL  Final  Special Requests   Final    BOTTLES DRAWN AEROBIC ONLY Blood Culture adequate volume   Culture   Final    NO GROWTH 3 DAYS Performed at [facility], [address], [place], [place] [address]    Report Status PENDING  Incomplete  Resp panel by RT-PCR (RSV, Flu A&B, Covid) Anterior Nasal Swab     Status: None   Collection Time: 06/05/24  7:43 PM   Specimen: Anterior Nasal Swab  Result Value Ref Range Status   SARS Coronavirus 2 by RT PCR NEGATIVE NEGATIVE Final   Influenza A by PCR NEGATIVE NEGATIVE Final   Influenza B by PCR NEGATIVE NEGATIVE Final    Comment: (NOTE) The Xpert Xpress SARS-CoV-2/FLU/RSV plus assay is intended as an aid in the diagnosis of influenza from Nasopharyngeal swab specimens and should not be used as a sole basis for treatment. Nasal washings and aspirates are unacceptable for Xpert Xpress SARS-CoV-2/FLU/RSV testing.  Fact Sheet for Patients: BloggerCourse.com  Fact Sheet for Healthcare Providers: SeriousBroker.it  This test is not yet approved or cleared by the [place]  FDA  and has been authorized for detection and/or diagnosis of SARS-CoV-2 by FDA under an Emergency Use Authorization (EUA). This EUA will remain in effect (meaning this test can be used) for the duration of the COVID-19 declaration under Section 564(b)(1) of the Act, 21 U.S.C. section 360bbb-3(b)(1), unless the authorization is terminated or revoked.     Resp Syncytial Virus by PCR NEGATIVE NEGATIVE Final    Comment: (NOTE) Fact Sheet for Patients: BloggerCourse.com  Fact Sheet for Healthcare Providers: SeriousBroker.it  This test is not yet approved or cleared by the [place]  FDA and has been authorized for detection and/or diagnosis of SARS-CoV-2 by FDA under an Emergency Use Authorization (EUA). This EUA will remain in effect (meaning this test can be used) for the duration of the COVID-19 declaration under Section 564(b)(1) of the Act, 21 U.S.C. section 360bbb-3(b)(1), unless the authorization is terminated or revoked.  Performed at [facility], [address], [place], [place] [address]   Culture, blood (routine x 2)     Status: None (Preliminary result)   Collection Time: 06/06/24  9:07 AM   Specimen: BLOOD RIGHT HAND  Result Value Ref Range Status   Specimen Description BLOOD RIGHT HAND  Final   Special Requests AEROBIC BOTTLE ONLY Blood Culture adequate volume  Final   Culture   Final    NO GROWTH 2 DAYS Performed at [facility], [address] [place], [place] [address]    Report Status PENDING  Incomplete    RADIOLOGY STUDIES/RESULTS: ECHOCARDIOGRAM COMPLETE Result Date: 06/06/2024    ECHOCARDIOGRAM REPORT   Patient Name:   Kyle [name] Date of Exam: 06/06/2024 Medical Rec #:  [id number]     Height:       67.0 in Accession #:    [id number]    Weight:       155.0 lb Date of Birth:  1934/12/16      BSA:          1.815 m Patient Age:    89 years      BP:           132/71 mmHg Patient Gender: M              HR:           84 bpm. Exam Location:  Inpatient Procedure: 2D Echo, Cardiac Doppler and Color Doppler (  Both Spectral and Color            Flow Doppler were utilized during procedure). Indications:    Stroke  History:        Patient has no prior history of Echocardiogram examinations.                 Risk Factors:Dyslipidemia and Hypertension.                  Mitral Valve: prosthetic annuloplasty ring valve is present in                 the mitral position. Procedure Date: 10/17/2005.  Sonographer:    Philomena Daring Referring Phys: [id number] [facility] KHALIQDINA IMPRESSIONS  1. Left ventricular ejection fraction, by estimation, is 60 to 65%. The left ventricle has normal function. The left ventricle has no regional wall motion abnormalities.  2. Right ventricular systolic function is normal. The right ventricular size is normal.  3. Left atrial size was mildly dilated.  4. Right atrial size was mildly dilated.  5. The mitral valve has been repaired/replaced. No evidence of mitral valve regurgitation. No evidence of mitral stenosis. There is a prosthetic annuloplasty ring present in the mitral position. Procedure Date: 10/17/2005.  6. The aortic valve is normal in structure. Aortic valve regurgitation is mild. No aortic stenosis is present.  7. The inferior vena cava is normal in size with greater than 50% respiratory variability, suggesting right atrial pressure of 3 mmHg. Conclusion(s)/Recommendation(s): No intracardiac source of embolism detected on this transthoracic study. Consider a transesophageal echocardiogram to exclude cardiac source of embolism if clinically indicated. FINDINGS  Left Ventricle: Left ventricular ejection fraction, by estimation, is 60 to 65%. The left ventricle has normal function. The left ventricle has no regional wall motion abnormalities. The left ventricular internal cavity size was normal in size. There is  no left ventricular hypertrophy. Right Ventricle: The right ventricular size is normal.  No increase in right ventricular wall thickness. Right ventricular systolic function is normal. Left Atrium: Left atrial size was mildly dilated. Right Atrium: Right atrial size was mildly dilated. Pericardium: There is no evidence of pericardial effusion. Mitral Valve: The mitral valve has been repaired/replaced. No evidence of mitral valve regurgitation. There is a prosthetic annuloplasty ring present in the mitral position. Procedure Date: 10/17/2005. No evidence of mitral valve stenosis. Tricuspid Valve: The tricuspid valve is normal in structure. Tricuspid valve regurgitation is mild . No evidence of tricuspid stenosis. Aortic Valve: The aortic valve is normal in structure. Aortic valve regurgitation is mild. No aortic stenosis is present. Pulmonic Valve: The pulmonic valve was normal in structure. Pulmonic valve regurgitation is not visualized. No evidence of pulmonic stenosis. Aorta: The aortic root is normal in size and structure. Venous: The inferior vena cava is normal in size with greater than 50% respiratory variability, suggesting right atrial pressure of 3 mmHg. IAS/Shunts: No atrial level shunt detected by color flow Doppler.  LEFT VENTRICLE PLAX 2D LVIDd:         4.90 cm   Diastology LVIDs:         3.50 cm   LV e' medial:    6.64 cm/s LV PW:         1.10 cm   LV E/e' medial:  22.3 LV IVS:        1.40 cm   LV e' lateral:   7.07 cm/s LVOT diam:     2.20 cm   LV E/e' lateral: 20.9  LV SV:         52 LV SV Index:   29 LVOT Area:     3.80 cm  RIGHT VENTRICLE RV S prime:     9.46 cm/s TAPSE (M-mode): 1.6 cm LEFT ATRIUM             Index        RIGHT ATRIUM           Index LA Vol (A2C):   60.1 ml 33.12 ml/m  RA Area:     22.30 cm LA Vol (A4C):   64.3 ml 35.44 ml/m  RA Volume:   64.20 ml  35.38 ml/m LA Biplane Vol: 62.8 ml 34.61 ml/m  AORTIC VALVE LVOT Vmax:   81.60 cm/s LVOT Vmean:  50.700 cm/s LVOT VTI:    0.137 m  AORTA Ao Root diam: 2.80 cm Ao Asc diam:  2.90 cm MITRAL VALVE                TRICUSPID  VALVE MV Area (PHT): 2.21 cm     TR Peak grad:   14.4 mmHg MV Decel Time: 344 msec     TR Vmax:        190.00 cm/s MV E velocity: 148.00 cm/s                             SHUNTS                             Systemic VTI:  0.14 m                             Systemic Diam: 2.20 cm Oneil Parchment MD Electronically signed by Oneil Parchment MD Signature Date/Time: 06/06/2024/1:20:44 PM    Final      LOS: 2 days   Donalda Applebaum, MD  [facility]    To contact the attending provider between 7A-7P or the covering provider during after hours 7P-7A, please log into the web site [email or web address] and access using universal [place] password for that web site. If you do not have the password, please call the hospital operator.  06/08/2024, 9:40 AM

## 2024-06-08 NOTE — Plan of Care (Signed)
  Problem: Education: Goal: Knowledge of disease or condition will improve Outcome: Progressing Goal: Knowledge of secondary prevention will improve (MUST DOCUMENT ALL) Outcome: Progressing   Problem: Ischemic Stroke/TIA Tissue Perfusion: Goal: Complications of ischemic stroke/TIA will be minimized Outcome: Progressing   Problem: Health Behavior/Discharge Planning: Goal: Ability to manage health-related needs will improve Outcome: Progressing Goal: Goals will be collaboratively established with patient/family Outcome: Progressing

## 2024-06-08 NOTE — Progress Notes (Addendum)
 STROKE TEAM PROGRESS NOTE    INTERIM HISTORY/SUBJECTIVE Patient remains hemodynamically stable and afebrile.  He is seen in his room with 2 family members at the bedside.  Family hopes that he can return home and can provide 24-hour assistance for the time being.  Eliquis for atrial fibrillation was started today.  OBJECTIVE  CBC    Component Value Date/Time   WBC 9.1 06/06/2024 0907   RBC 4.31 06/06/2024 0907   HGB 13.4 06/06/2024 0907   HGB 13.7 12/05/2023 0827   HCT 39.6 06/06/2024 0907   HCT 40.9 12/05/2023 0827   PLT 102 (L) 06/06/2024 0907   PLT 257 12/05/2023 0827   MCV 91.9 06/06/2024 0907   MCV 91 12/05/2023 0827   MCH 31.1 06/06/2024 0907   MCHC 33.8 06/06/2024 0907   RDW 13.2 06/06/2024 0907   RDW 12.6 12/05/2023 0827   LYMPHSABS 2.5 06/05/2024 1900   LYMPHSABS 2.6 12/05/2023 0827   MONOABS 0.9 06/05/2024 1900   EOSABS 0.0 06/05/2024 1900   EOSABS 0.1 12/05/2023 0827   BASOSABS 0.0 06/05/2024 1900   BASOSABS 0.1 12/05/2023 0827    BMET    Component Value Date/Time   NA 138 06/06/2024 0907   NA 138 12/05/2023 0827   K 3.6 06/06/2024 0907   CL 103 06/06/2024 0907   CO2 19 (L) 06/06/2024 0907   GLUCOSE 97 06/06/2024 0907   BUN 8 06/06/2024 0907   BUN 10 12/05/2023 0827   CREATININE 0.65 06/06/2024 0907   CREATININE 0.81 02/17/2013 1230   CALCIUM  8.6 (L) 06/06/2024 0907   EGFR 87 12/05/2023 0827   GFRNONAA >60 06/06/2024 0907   GFRNONAA 86 02/17/2013 1230    IMAGING past 24 hours No results found.   Vitals:   06/07/24 1625 06/07/24 1953 06/08/24 0000 06/08/24 0816  BP: (!) 143/70 136/86 116/66 122/70  Pulse: 83 (!) 103 78 82  Resp: (!) 22 (!) 21 (!) 22 19  Temp: 98.7 F (37.1 C) 98.3 F (36.8 C) 98.9 F (37.2 C) 97.7 F (36.5 C)  TempSrc: Oral Oral Oral Oral  SpO2: 98% 97% 92% 94%  Weight:      Height:         PHYSICAL EXAM General:  Alert, well-nourished, well-developed patient in no acute distress Psych:  Mood and affect appropriate  for situation CV: A-fib on the monitor Respiratory:  Regular, unlabored respirations on room air   NEURO:  Mental Status: AA&Ox3, speech is tangential Speech/Language: speech is without dysarthria or aphasia.    Cranial Nerves:  II: PERRL. Visual field full  III, IV, VI: EOMI. Eyelids elevate symmetrically.  V: Sensation is intact to light touch and symmetrical to face.  VII: left facial droop VIII: hearing intact to voice. IX, X: Palate elevates symmetrically. Phonation is normal.  XII: tongue is midline without fasciculations. Motor: RUE 5/5, RLE 4/5, LLE 4-/5, LUE proximal 3/5 due to left shoulder injury on sling. L hand grip 3/5 due to pain on left shoulder Tone: is normal and bulk is normal Sensation- Intact to light touch bilaterally.  Coordination: FTN intact bilaterally Gait- deferred   ASSESSMENT/PLAN [name] is a 89 y.o. male with history of mitral valve prolapse with mitral regurgitation status postrepair in 2007 complicated by postop A-fib no longer on anticoagulation, stroke, CAD, hypertension, hyperlipidemia and BPH admitted after a fall with left shoulder pain, confusion and left facial droop.  He was found on MRI to have an acute infarct in the right basal  ganglia.  He was found to be in A-fib on admission.  After discussion with cardiology, patient is eligible for DOAC for management of his A-fib but will need to wait a few days given petechial hemorrhage seen on MRI.  NIH on Admission 3  Stroke:  left BG large infarct, etiology: Embolic in the setting of A-fib not on AC CT head acute early subacute right basal ganglia infarct CTA head & neck no LVO or hemodynamically significant stenosis, bilateral fetal PCAs MRI acute infarct in right basal ganglia with associated petechial hemorrhage, small acute embolic infarct in right parietal cortex, moderate chronic periventricular white matter disease 2D Echo EF 60 to 65%.  Left atrium mildly dilated. LDL  127 HgbA1c 5.4 VTE prophylaxis - SCDs aspirin  81 mg daily prior to admission, now on aspirin  81 mg daily, will switch to Eliquis today.  Therapy recommendations:  CIR Disposition: Pending   Atrial fibrillation Home Meds: None History of MVR 2007 with postop A-fib Now admission with him on telemetry and EKG Continue telemetry monitoring Start Eliquis today, discussed importance of compliance with this medication with patient and family  Hypertension Home meds: Amlodipine  5 mg daily Stable Long-term BP goal normotensive  Hyperlipidemia Home meds: Atorvastatin  20 mg daily  LDL 127, goal < 70 Increase to Lipitor 40 Continue statin at discharge  Other Stroke Risk Factors CAD History of stroke, details not clear  Other Active Problems BPH Mild thrombocytopenia, platelet 102  Hospital day # 2  Patient seen by NP and then by MD, MD to edit note as needed. Cortney E Everitt Clint Kill , MSN, AGACNP-BC [facility] See Amion for schedule and pager information 06/08/2024 4:28 PM  ATTENDING NOTE: I reviewed above note and agree with the assessment and plan.   No acute events overnight, patient neuro stable.  Will switch aspirin  to Eliquis today.  Continue Lipitor.  PT and OT recommend CIR.  For detailed assessment and plan, please refer to above as I have made changes wherever appropriate.   Neurology will sign off. Please call with questions. Pt will follow up with stroke clinic NP at [facility] in about 4 weeks. Thanks for the consult.  Ary Cummins, MD PhD Stroke Neurology 06/08/2024 7:11 PM     To contact Stroke Continuity provider, please refer to [email or web address]. After hours, contact General Neurology

## 2024-06-08 NOTE — Evaluation (Signed)
 Occupational Therapy Evaluation Patient Details Name: Kyle [name] MRN: [id number] DOB: 10/27/1934 Today's Date: 06/08/2024   History of Present Illness   Patient is a 89 y.o. male who presented to [facility] on 9/25 after a fall with subsequent imaging studies demonstrated a left clavicle fracture and acute CVA with acute infarct in right basal ganglia with associated petechial hemorrhage, small acute embolic infarct in right parietal cortex, and moderate chronic periventricular white matter disease. PMH: mitral valve repair with postoperative A-fib in 2006, CVA in 2007, CAD, HTN, HLD     Clinical Impressions At baseline, pt is Ind with ADLs, IADLs, and functional mobility without an AD, and drives. Pt now presents with decreased functional use of L UE due to L clavicle fx and CVA, decreased coordination in L UE, decreased proprioception in L UE, decreased cognition, impaired vision(see details below), decreased activity tolerance, decreased balance, and decreased safety and independence with functional tasks. Pt currently demonstrates ability to complete ADLs largely with Set up to Max assist and bed mobility with Min assist +2. Pt participated well in session, is motivated to increase independence, and has good family support. Pt VSS on RA throughout session. Pt will benefit from acute skilled OT services to address deficits outlined below and to increase safety and independence with functional tasks. Post acute discharge, pt will benefit from intensive inpatient skilled rehab services > 3 hours per day to maximize rehab potential.      If plan is discharge home, recommend the following:   A little help with walking and/or transfers;A lot of help with bathing/dressing/bathroom;Assistance with cooking/housework;Direct supervision/assist for medications management;Direct supervision/assist for financial management;Assist for transportation;Help with stairs or ramp for entrance;Supervision due to  cognitive status     Functional Status Assessment   Patient has had a recent decline in their functional status and demonstrates the ability to make significant improvements in function in a reasonable and predictable amount of time.     Equipment Recommendations   Other (comment) (TBD based on pt progress)     Recommendations for Other Services   Rehab consult     Precautions/Restrictions   Precautions Precautions: Fall Recall of Precautions/Restrictions: Impaired Restrictions Weight Bearing Restrictions Per Provider Order: No     Mobility Bed Mobility Overal bed mobility: Needs Assistance Bed Mobility: Supine to Sit, Sit to Supine     Supine to sit: Min assist, +2 for physical assistance, +2 for safety/equipment Sit to supine: Min assist, +2 for physical assistance, +2 for safety/equipment   General bed mobility comments: Assist to manage truck/support L shoulder; cue for hand placement/technique and sequencing    Transfers Overall transfer level: Needs assistance                 General transfer comment: Pt declined functional transfers this session with no reason given. Pt requiring Min assist for transfers with PT on 9/27.      Balance Overall balance assessment: Needs assistance Sitting-balance support: Single extremity supported, Feet supported Sitting balance-Leahy Scale: Good                                     ADL either performed or assessed with clinical judgement   ADL Overall ADL's : Needs assistance/impaired Eating/Feeding: Set up;Sitting   Grooming: Minimal assistance;Sitting;Cueing for compensatory techniques   Upper Body Bathing: Moderate assistance;Maximal assistance;Cueing for sequencing;Cueing for compensatory techniques   Lower Body Bathing: Maximal assistance;Cueing  for compensatory techniques;Sitting/lateral leans;Sit to/from stand;Cueing for sequencing;Cueing for safety   Upper Body Dressing : Minimal  assistance;Moderate assistance;Sitting;Cueing for sequencing;Cueing for compensatory techniques   Lower Body Dressing: Moderate assistance;Maximal assistance;Sitting/lateral leans;Sit to/from stand;Cueing for compensatory techniques;Cueing for safety                       Vision Baseline Vision/History: 1 Wears glasses Patient Visual Report: No change from baseline Vision Assessment?: Yes Eye Alignment: Within Functional Limits Ocular Range of Motion: Restricted on the left;Restricted looking down;Restricted looking up Alignment/Gaze Preference: Within Defined Limits Tracking/Visual Pursuits: Left eye does not track laterally;Decreased smoothness of vertical tracking;Decreased smoothness of horizontal tracking;Decreased smoothness of eye movement to LEFT superior field;Decreased smoothness of eye movement to LEFT inferior field;Requires cues, head turns, or add eye shifts to track;Left eye does not track medially;Right eye does not track medially Saccades: Additional eye shifts occurred during testing;Additional head turns occurred during testing Convergence: Impaired (comment) (Impaired on Left) Visual Fields: Left visual field deficit;Left homonymous hemianopsia Diplopia Assessment:  (no diplopia) Depth Perception:  (to be tested further in funcitonal contexts)     Perception         Praxis         Pertinent Vitals/Pain Pain Assessment Pain Assessment: No/denies pain Pain Intervention(s): Monitored during session     Extremity/Trunk Assessment Upper Extremity Assessment Upper Extremity Assessment: Right hand dominant;LUE deficits/detail (R UE overall WFL) LUE Deficits / Details: L shoulder fx; AROM of elbow, wrist, and hand WFL; decreased coordination; decreased proprioception; shoulder ROM not tested secondary to awaiting Ortho consult; pt with no WB restrictions per MD; pt with L UE sling LUE Sensation: decreased proprioception LUE Coordination: decreased fine  motor;decreased gross motor   Lower Extremity Assessment Lower Extremity Assessment: Defer to PT evaluation   Cervical / Trunk Assessment Cervical / Trunk Assessment: Kyphotic   Communication Communication Communication: Impaired Factors Affecting Communication: Other (comment) (Requires increased time for processing when answering questions)   Cognition Arousal: Alert Behavior During Therapy: WFL for tasks assessed/performed Cognition: Cognition impaired     Awareness: Intellectual awareness intact, Online awareness impaired Memory impairment (select all impairments): Short-term memory, Working [employer] fast streamer, Conservation officer, historic buildings Attention impairment (select first level of impairment): Selective attention (Very easily internally distracted) Executive functioning impairment (select all impairments): Organization, Reasoning, Problem solving OT - Cognition Comments: Pt's wife passed about 2 years ago; however, since CVA, pt is talking about his wife as if she is alive. Pt AAOx4 with increased time required for processing. Pt often tangential in conversation, requiring cues to attend to task/conversation at hand.                 Following commands: Impaired Following commands impaired: Follows multi-step commands with increased time, Only follows one step commands consistently, Follows one step commands with increased time, Follows multi-step commands inconsistently     Cueing  General Comments   Cueing Techniques: Verbal cues;Gestural cues;Tactile cues;Visual cues  VSS on RA during session. Family members present and supportive during session.   Exercises     Shoulder Instructions      Home Living Family/patient expects to be discharged to:: Private residence Living Arrangements: Alone Available Help at Discharge: Family;Friend(s);Available PRN/intermittently;Available 24 hours/day;  Type of Home: House Home Access: Ramped entrance     Home Layout: One level      Bathroom Shower/Tub: Walk-in shower;Tub/shower unit   Bathroom Toilet: Standard     Home Equipment: None  Comments: Family reports they can coordinate 24/7 care for pt post discharge temporarily.       Prior Functioning/Environment Prior Level of Function : Independent/Modified Independent;History of Falls (last six months);Driving             Mobility Comments: Ind without an AD ADLs Comments: Ind with ADLs and IADLs; drives; enjoys doing yard work    OT Problem List: Decreased activity tolerance;Impaired balance (sitting and/or standing);Decreased coordination;Decreased cognition;Decreased safety awareness;Decreased knowledge of use of DME or AE;Decreased knowledge of precautions;Impaired UE functional use   OT Treatment/Interventions: Self-care/ADL training;Therapeutic exercise;Energy conservation;DME and/or AE instruction;Therapeutic activities;Cognitive remediation/compensation;Patient/family education;Balance training;Visual/perceptual remediation/compensation      OT Goals(Current goals can be found in the care plan section)   Acute Rehab OT Goals Patient Stated Goal: to return home and be independent OT Goal Formulation: With patient/family Time For Goal Achievement: 06/22/24 Potential to Achieve Goals: Good ADL Goals Pt Will Perform Upper Body Bathing: with contact guard assist;sitting Pt Will Perform Lower Body Dressing: with contact guard assist;sitting/lateral leans;sit to/from stand Pt Will Transfer to Toilet: with supervision;ambulating;regular height toilet (with least restrictive AD) Pt Will Perform Toileting - Clothing Manipulation and hygiene: with contact guard assist;sit to/from stand;sitting/lateral leans Additional ADL Goal #1: Patient will participate in home exercise program for improved visual pursuits with Supervision and handout provided.   OT Frequency:  Min 2X/week    Co-evaluation              AM-PAC OT 6 Clicks Daily Activity      Outcome Measure Help from another person eating meals?: A Little Help from another person taking care of personal grooming?: A Little Help from another person toileting, which includes using toliet, bedpan, or urinal?: A Lot Help from another person bathing (including washing, rinsing, drying)?: A Lot Help from another person to put on and taking off regular upper body clothing?: A Lot Help from another person to put on and taking off regular lower body clothing?: A Lot 6 Click Score: 14   End of Session Equipment Utilized During Treatment: Other (comment) (L UE sling) Nurse Communication: Mobility status  Activity Tolerance: Patient tolerated treatment well;Other (comment) (Pt limited by current cognitve level) Patient left: in bed;with call bell/phone within reach;with bed alarm set;with family/visitor present  OT Visit Diagnosis: Other abnormalities of gait and mobility (R26.89);History of falling (Z91.81)                Time: 8380-8296 OT Time Calculation (min): 44 min Charges:  OT General Charges $OT Visit: 1 Visit OT Evaluation $OT Eval Moderate Complexity: 1 Mod OT Treatments $Self Care/Home Management : 8-22 mins $Therapeutic Activity: 8-22 mins  Margarie Rockey HERO., OTR/L, MA Acute Rehab [phone]   Margarie FORBES Horns 06/08/2024, 6:52 PM

## 2024-06-08 NOTE — Plan of Care (Signed)

## 2024-06-09 DIAGNOSIS — S42036S Nondisplaced fracture of lateral end of unspecified clavicle, sequela: Secondary | ICD-10-CM | POA: Diagnosis not present

## 2024-06-09 DIAGNOSIS — I639 Cerebral infarction, unspecified: Secondary | ICD-10-CM | POA: Diagnosis not present

## 2024-06-09 DIAGNOSIS — I480 Paroxysmal atrial fibrillation: Secondary | ICD-10-CM | POA: Diagnosis not present

## 2024-06-09 DIAGNOSIS — I10 Essential (primary) hypertension: Secondary | ICD-10-CM | POA: Diagnosis not present

## 2024-06-09 DIAGNOSIS — E782 Mixed hyperlipidemia: Secondary | ICD-10-CM | POA: Diagnosis not present

## 2024-06-09 NOTE — Progress Notes (Addendum)
 Inpatient Rehab Admissions:  Inpatient Rehab Consult received.  I met with patient at the bedside for rehabilitation assessment and to discuss goals and expectations of an inpatient rehab admission.  Discussed average length of stay, insurance authorization requirement and discharge home after completion of CIR. Pt acknowledged understanding and is interested in pursuing CIR. Pt gave permission to contact family. Attempted to contact pt's daughter Marval and son Oneil. Left messages; awaiting return calls. Also await MD consult.  Will continue to follow.   1112: [name] returned call. Discussed CIR goals and expectations. She acknowledged understanding and is supportive of pt pursuing CIR. She confirmed that family will be able to provide 24/7 support for pt after discharge. 1307: Pt's son Oneil returned call. Discussed CIR goals and expectations. He acknowledged understanding and is supportive of pt pursuing CIR.   Signed: Tinnie Yvone Cohens, MS, CCC-SLP Admissions Coordinator [phone]

## 2024-06-09 NOTE — TOC Progression Note (Signed)
 Transition of Care [facility]) - Progression Note    Patient Details  Name: Kyle [name] MRN: [id number] Date of Birth: 09-22-1934  Transition of Care [facility]) CM/SW Contact  Tom-Johnson, Harvest Muskrat, RN Phone Number: 06/09/2024, 1:31 PM  Clinical Narrative:     CIR following for potential admit.   TOC will continue to follow as patient progresses with care towards discharge.   Expected Discharge Plan: Skilled Nursing Facility Barriers to Discharge: Continued Medical Work up               Expected Discharge Plan and Services       Living arrangements for the past 2 months: Single Family Home                                       Social Drivers of Health (SDOH) Interventions SDOH Screenings   Food Insecurity: No Food Insecurity (06/06/2024)  Housing: Low Risk  (06/06/2024)  Transportation Needs: No Transportation Needs (06/06/2024)  Utilities: Not At Risk (06/06/2024)  Alcohol Screen: Low Risk  (09/06/2023)  Depression (PHQ2-9): Low Risk  (12/05/2023)  Financial Resource Strain: Low Risk  (09/06/2023)  Physical Activity: Insufficiently Active (09/06/2023)  Social Connections: Moderately Integrated (06/06/2024)  Stress: No Stress Concern Present (09/06/2023)  Tobacco Use: Medium Risk (06/05/2024)  Health Literacy: Adequate Health Literacy (09/06/2023)    Readmission Risk Interventions     No data to display

## 2024-06-09 NOTE — Progress Notes (Signed)
 Physical Therapy Treatment Patient Details Name: Kyle [name] MRN: [id number] DOB: Mar 06, 1935 Today's Date: 06/09/2024   History of Present Illness Patient is a 89 y.o. male who presented to [facility] on 9/25 after a fall with subsequent imaging studies demonstrated a left clavicle fracture and acute CVA with acute infarct in right basal ganglia with associated petechial hemorrhage, small acute embolic infarct in right parietal cortex, and moderate chronic periventricular white matter disease. PMH: mitral valve repair with postoperative A-fib in 2006, CVA in 2007, CAD, HTN, HLD    PT Comments  Pt progressing well towards all goals despite L UE NWB and in a sling. Pt with improved gait pattern and distance since initial PT evaluation. Anticipate pt to progress well and be able to achieve safe mod I level of function s/p undergoing aggressive inpatient rehab program > 3 hrs a day. Acute PT to cont to follow.    If plan is discharge home, recommend the following: A little help with walking and/or transfers;A lot of help with bathing/dressing/bathroom;Assistance with cooking/housework;Direct supervision/assist for medications management;Direct supervision/assist for financial management;Assist for transportation;Help with stairs or ramp for entrance;Supervision due to cognitive status   Can travel by private vehicle        Equipment Recommendations  Other (comment) (Per accepting facility)    Recommendations for Other Services Rehab consult     Precautions / Restrictions Precautions Precautions: Fall Recall of Precautions/Restrictions: Impaired Restrictions Weight Bearing Restrictions Per Provider Order: No     Mobility  Bed Mobility               General bed mobility comments: pt received sitting up in chair, returned to chair    Transfers Overall transfer level: Needs assistance Equipment used: 1 person hand held assist Transfers: Sit to/from Stand Sit to Stand: Min assist            General transfer comment: minA to power up as pt unable to use L UE functionally at this time    Ambulation/Gait Ambulation/Gait assistance: Min assist Gait Distance (Feet): 150 Feet Assistive device: 1 person hand held assist Gait Pattern/deviations: Ataxic, Narrow base of support, Decreased stride length, Step-through pattern Gait velocity: decreased     General Gait Details: Pt with improved width of base of support and consistent step height and length compared to saturday. Pt with progressive R UE dependency with increased ambulation distance.   Stairs             Wheelchair Mobility     Tilt Bed    Modified Rankin (Stroke Patients Only) Modified Rankin (Stroke Patients Only) Pre-Morbid Rankin Score: No symptoms Modified Rankin: Slight disability     Balance Overall balance assessment: Needs assistance Sitting-balance support: Single extremity supported, Feet supported Sitting balance-Leahy Scale: Good     Standing balance support: Bilateral upper extremity supported, During functional activity Standing balance-Leahy Scale: Poor Standing balance comment: Reliant on HHA                            Communication Communication Communication: Impaired Factors Affecting Communication: Other (comment) (Requires increased time for processing when answering questions)  Cognition Arousal: Alert Behavior During Therapy: WFL for tasks assessed/performed   PT - Cognitive impairments: Orientation, Safety/Judgement   Orientation impairments: Situation                   PT - Cognition Comments: Pt appears to be somewhat confused. Per family in room,  since the stroke pt believes he deceased wife is still around. Following commands: Impaired Following commands impaired: Follows multi-step commands with increased time, Only follows one step commands consistently, Follows one step commands with increased time, Follows multi-step commands  inconsistently    Cueing Cueing Techniques: Verbal cues, Gestural cues, Tactile cues, Visual cues  Exercises      General Comments General comments (skin integrity, edema, etc.): VSS on RA      Pertinent Vitals/Pain Pain Assessment Pain Assessment: Faces Faces Pain Scale: Hurts little more Pain Location: L shoulder Pain Descriptors / Indicators: Sore    Home Living     Available Help at Discharge: Family;Available 24 hours/day   Home Access: Stairs to enter Entrance Stairs-Rails: None Entrance Stairs-Number of Steps: 1            Prior Function            PT Goals (current goals can now be found in the care plan section) Acute Rehab PT Goals Patient Stated Goal: to go home PT Goal Formulation: With patient Time For Goal Achievement: 06/21/24 Potential to Achieve Goals: Good Progress towards PT goals: Progressing toward goals    Frequency    Min 3X/week      PT Plan      Co-evaluation              AM-PAC PT 6 Clicks Mobility   Outcome Measure  Help needed turning from your back to your side while in a flat bed without using bedrails?: A Little Help needed moving from lying on your back to sitting on the side of a flat bed without using bedrails?: A Little Help needed moving to and from a bed to a chair (including a wheelchair)?: A Little Help needed standing up from a chair using your arms (e.g., wheelchair or bedside chair)?: A Little Help needed to walk in hospital room?: A Little Help needed climbing 3-5 steps with a railing? : A Lot 6 Click Score: 17    End of Session Equipment Utilized During Treatment: Gait belt Activity Tolerance: Patient tolerated treatment well Patient left: with call bell/phone within reach;with family/visitor present;in chair;with chair alarm set Nurse Communication: Mobility status PT Visit Diagnosis: Other abnormalities of gait and mobility (R26.89)     Time: 1255-1315 PT Time Calculation (min) (ACUTE  ONLY): 20 min  Charges:    $Gait Training: 8-22 mins PT General Charges $$ ACUTE PT VISIT: 1 Visit                     Norene Ames, PT, DPT Acute Rehabilitation Services Secure chat preferred Office #: [phone]    Norene CHRISTELLA Ames 06/09/2024, 3:12 PM

## 2024-06-09 NOTE — Progress Notes (Signed)
   06/09/24 0946  Mobility  Activity Pivoted/transferred from bed to chair;Ambulated with assistance  Level of Assistance Minimal assist, patient does 75% or more  Assistive Device None  Distance Ambulated (ft) 5 ft  Activity Response Tolerated fair  Mobility Referral Yes  Mobility visit 1 Mobility  Mobility Specialist Start Time (ACUTE ONLY) 0946  Mobility Specialist Stop Time (ACUTE ONLY) 1011  Mobility Specialist Time Calculation (min) (ACUTE ONLY) 25 min   Mobility Specialist: Progress Note  Pre-Mobility:      HR 82 Post-Mobility:    HR 84  Pt agreeable to mobility session - received in bed. Pt was asymptomatic throughout session with no complaints. Returned to chair with all needs met - call bell within reach. Chair alarm on.   Virgle Boards, BS Mobility Specialist Please contact via SecureChat or  Rehab office at [phone].

## 2024-06-09 NOTE — Care Management Important Message (Signed)
 Important Message  Patient Details  Name: Kyle [name] MRN: [id number] Date of Birth: 1935/05/25   Important Message Given:  Yes - Medicare IM     [name] 06/09/2024, 12:31 PM

## 2024-06-09 NOTE — Care Management Important Message (Signed)
 Important Message  Patient Details  Name: Kyle [name] MRN: [id number] Date of Birth: Apr 22, 1935   Important Message Given:  Yes - Medicare IM     [name] 06/09/2024, 3:43 PM

## 2024-06-09 NOTE — Consult Note (Signed)
 Physical Medicine and Rehabilitation Consult Reason for Consult:left sided weakness after fall Referring Physician: Jerri   HPI: Kyle [name] is a 89 y.o. male with a hx of Mitral valve prolapse, mitral regurg, s/p repair in 2007 complicated by post-op a fib who was admitted after a fall on 9/25 with associated confusion and left facial weakness. He was found to be in atrial fibrillation. CTH revealed early/subacute right basal ganglia infarct. CTA head and neck without LVO or significant stenosis. MRI revealed acute infarct in the right basal ganglia with associated petechial hemorrhage, small acute infarct in the parietal lobe. Pt was on asa PTA was changed to eliquis per neurology recs for embolic stroke. + left distal clavicle fx, consvt mgt recommended, sling. Pt was indpendent and driving prior to this admission. I actually took care of his wife for several years and know him.  He lives in one level home with ramped entrance. With therapy he has been min assist for sit-std transfers  and walked 75' min HH assist. He has been min to max assist with ADL's.     Home: Home Living Family/patient expects to be discharged to:: Private residence Living Arrangements: Alone Available Help at Discharge: Family, Friend(s), Available PRN/intermittently, Available 24 hours/day Type of Home: House Home Access: Ramped entrance Home Layout: One level Bathroom Shower/Tub: Psychologist, counselling, Engineer, manufacturing systems: Standard Home Equipment: None Additional Comments: Family reports they can coordinate 24/7 care for pt post discharge temporarily.  Lives With: Alone (wife died 2 years ago)  Functional History: Prior Function Prior Level of Function : Independent/Modified Independent, History of Falls (last six months), Driving Mobility Comments: Ind without an AD ADLs Comments: Ind with ADLs and IADLs; drives; enjoys doing yard work Functional Status:  Mobility: Bed Mobility Overal bed  mobility: Needs Assistance Bed Mobility: Supine to Sit, Sit to Supine Supine to sit: Min assist, +2 for physical assistance, +2 for safety/equipment Sit to supine: Min assist, +2 for physical assistance, +2 for safety/equipment General bed mobility comments: Assist to manage truck/support L shoulder; cue for hand placement/technique and sequencing Transfers Overall transfer level: Needs assistance Equipment used: 1 person hand held assist Transfers: Sit to/from Stand Sit to Stand: Min assist General transfer comment: Pt declined functional transfers this session with no reason given. Pt requiring Min assist for transfers with PT on 9/27. Ambulation/Gait Ambulation/Gait assistance: Min assist Gait Distance (Feet): 75 Feet Assistive device: 1 person hand held assist Gait Pattern/deviations: Ataxic, Narrow base of support, Decreased stride length, Step-through pattern General Gait Details: Pt noted with very narrow ataxic gait pattern. no LOB noted. Gait velocity: decreased    ADL: ADL Overall ADL's : Needs assistance/impaired Eating/Feeding: Set up, Sitting Grooming: Minimal assistance, Sitting, Cueing for compensatory techniques Upper Body Bathing: Moderate assistance, Maximal assistance, Cueing for sequencing, Cueing for compensatory techniques Lower Body Bathing: Maximal assistance, Cueing for compensatory techniques, Sitting/lateral leans, Sit to/from stand, Cueing for sequencing, Cueing for safety Upper Body Dressing : Minimal assistance, Moderate assistance, Sitting, Cueing for sequencing, Cueing for compensatory techniques Lower Body Dressing: Moderate assistance, Maximal assistance, Sitting/lateral leans, Sit to/from stand, Cueing for compensatory techniques, Cueing for safety  Cognition: Cognition Overall Cognitive Status: Impaired/Different from baseline Orientation Level: Oriented to person, Oriented to place, Oriented to time Cognition Arousal: Alert Behavior During  Therapy: [facility] for tasks assessed/performed Overall Cognitive Status: Impaired/Different from baseline   Review of Systems  Unable to perform ROS: Mental acuity   Past Medical History:  Diagnosis  Date   Atrial fibrillation [facility])    Post operative, maintaining normal sinus rhythm on amiodarone   Benign prostatic hypertrophy    CAD (coronary artery disease)    Non obstructive by catheterization Feb 2007 prior to his surgery revealing a 40-50% LAD lesion, good LV function with EF 55-60% by last echocardiogram prior to his surgery   Hyperlipidemia    Treated   Mitral regurgitation    S/P mitral valve repair Oct 17, 2005 by Dr. Dusty   Skin cancer    Past Surgical History:  Procedure Laterality Date   CARDIAC CATHETERIZATION  10/2005   COLONOSCOPY     MITRAL VALVE REPAIR  10/17/2005   Dr. Dusty   POLYPECTOMY     SKIN CANCER EXCISION Left    cheek   Family History  Problem Relation Age of Onset   COPD Father    Cancer Brother        melanoma   Heart disease Neg Hx    Colon cancer Neg Hx    Social History:  reports that he has quit smoking. His smoking use included cigarettes. He has never used smokeless tobacco. He reports that he does not drink alcohol and does not use drugs. Allergies:  Allergies  Allergen Reactions   Penicillins     REACTION: dizziness/syncope   Medications Prior to Admission  Medication Sig Dispense Refill   amLODipine  (NORVASC ) 5 MG tablet Take 1 tablet (5 mg total) by mouth daily. 90 tablet 3   aspirin  81 MG tablet Take 81 mg by mouth daily.       atorvastatin  (LIPITOR) 20 MG tablet Take 1 tablet (20 mg total) by mouth daily. 90 tablet 3   fish oil-omega-3 fatty acids  1000 MG capsule Take 1 capsule by mouth 2 (two) times daily.     Glucosamine-Chondroitin (OSTEO BI-FLEX REGULAR STRENGTH PO) Take 1 tablet by mouth in the morning and at bedtime.     Aspirin -Acetaminophen -Caffeine (GOODYS EXTRA STRENGTH PO) Take by mouth as needed. Arthritis        Blood pressure 121/84, pulse 77, temperature 97.7 F (36.5 C), temperature source Oral, resp. rate (!) 24, height 5' 7 (1.702 m), weight 70.3 kg, SpO2 94%. Physical Exam Constitutional:      General: He is not in acute distress. HENT:     Head: Normocephalic and atraumatic.     Nose: Nose normal.     Mouth/Throat:     Mouth: Mucous membranes are moist.  Eyes:     Conjunctiva/sclera: Conjunctivae normal.  Cardiovascular:     Rate and Rhythm: Normal rate.  Pulmonary:     Effort: Pulmonary effort is normal.  Abdominal:     Palpations: Abdomen is soft.  Musculoskeletal:        General: Swelling, tenderness (left shoulder, in sling) and deformity present.     Cervical back: Normal range of motion.  Skin:    General: Skin is warm.     Findings: Bruising (left shoulder/clavlicular area) present.  Neurological:     Mental Status: He is alert.     Comments: Pt is alert and oriented to person, self, hospital, knows he had a stroke. Refers to his wife still being alive at times during conversation. Able to follow basic commands. Speech and language normal. Mild left central VII. LUE difficult to assess d/t fx and pain. Grip and rist at least 3+ to 4/5. LLE 3+ HF, 4- KE and 4/5 ADF/PF. Moves RUE and RLE freely and 4 to 4+/5.  Sensory exam normal for light touch and pain in all 4 limbs. No limb ataxia or cerebellar signs. No abnormal tone appreciated.    Psychiatric:     Comments: Pleasant and cooperative. Delusional at times about wife     No results found for this or any previous visit (from the past 24 hours). No results found.  Assessment/Plan: Diagnosis: 89 yo male with large left basal ganglia infarct, likely embolic d/t atrial fibrillation. Left distal clavicle fx.  Does the need for close, 24 hr/day medical supervision in concert with the patient's rehab needs make it unreasonable for this patient to be served in a less intensive setting? Yes Co-Morbidities requiring  supervision/potential complications:  -HTN -MVR/MG Due to bladder management, bowel management, safety, skin/wound care, disease management, medication administration, pain management, and patient education, does the patient require 24 hr/day rehab nursing? Yes Does the patient require coordinated care of a physician, rehab nurse, therapy disciplines of PT, OT, and likely SLP to address physical and functional deficits in the context of the above medical diagnosis(es)? Yes Addressing deficits in the following areas: balance, endurance, locomotion, strength, transferring, bowel/bladder control, bathing, dressing, feeding, grooming, toileting, cognition, and psychosocial support Can the patient actively participate in an intensive therapy program of at least 3 hrs of therapy per day at least 5 days per week? Yes The potential for patient to make measurable gains while on inpatient rehab is excellent Anticipated functional outcomes upon discharge from inpatient rehab are supervision  with PT, supervision and min assist with OT, modified independent with SLP. Estimated rehab length of stay to reach the above functional goals is: 10-14 days Anticipated discharge destination: Home Overall Rehab/Functional Prognosis: excellent  POST ACUTE RECOMMENDATIONS: This patient's condition is appropriate for continued rehabilitative care in the following setting: CIR Patient has agreed to participate in recommended program. Potentially Note that insurance prior authorization may be required for reimbursement for recommended care.  Comment: Need to confirm family's ability to provide assist at home. Rehab Admissions Coordinator to follow up.     I have personally performed a face to face diagnostic evaluation of this patient. Additionally, I have examined the patient's medical record including any pertinent labs and radiographic images.    Thanks,  Arthea ONEIDA Gunther, MD 06/09/2024

## 2024-06-09 NOTE — Progress Notes (Signed)
 PROGRESS NOTE        PATIENT DETAILS Name: CORA STETSON Age: 89 y.o. Sex: male Date of Birth: 1934-09-15 Admit Date: 06/05/2024 Admitting Physician Editha Ram, MD ERE:Izuupwhzm, Fonda LABOR, MD  Brief Summary: Patient is a 89 y.o.  male with history of mitral valve repair with postoperative A-fib in 2006, CVA in 2007, CAD, HTN, HLD who presented with a fall-subsequent imaging studies demonstrated a left shoulder fracture and a acute CVA.  Significant events: 9/26>> admit to [facility]  Significant studies: 9/25>> x-ray left shoulder: Transverse nondisplaced fracture of the distal left clavicle. 9/25>> CT head: Subacute right basal ganglia infarct. 9/25>> CT chest/abdomen/pelvis: No acute findings. 9/26>> CT angio head/neck: No LVO/aneurysm in head/neck. 9/26>> MRI brain: Acute infarct right basal ganglia with associated petechial hemorrhage, small acute infarcts in the right parietal cortex. 9/26>> echo: EF 60-65% 9/26>> A1c: 5.4 9/26>> LDL: 127  Significant microbiology data: 9/25>> COVID/influenza/RSV PCR: Negative  Procedures: None  Consults: Neurology  Subjective: No major issues-lying comfortably in bed.  Objective: Vitals: Blood pressure 121/84, pulse 77, temperature 97.7 F (36.5 C), temperature source Oral, resp. rate (!) 24, height 5' 7 (1.702 m), weight 70.3 kg, SpO2 94%.   Exam: Gen Exam:Alert awake-not in any distress HEENT:atraumatic, normocephalic Chest: B/L clear to auscultation anteriorly CVS:S1S2 regular Abdomen:soft non tender, non distended Extremities:no edema Neurology: Non focal Skin: no rash  Pertinent Labs/Radiology:    Latest Ref Rng & Units 06/06/2024    9:07 AM 06/05/2024    7:00 PM 12/05/2023    8:27 AM  CBC  WBC 4.0 - 10.5 K/uL 9.1  13.4  7.2   Hemoglobin 13.0 - 17.0 g/dL 86.5  85.4  86.2   Hematocrit 39.0 - 52.0 % 39.6  42.9  40.9   Platelets 150 - 400 K/uL 102  272  257     Lab Results  Component  Value Date   NA 138 06/06/2024   K 3.6 06/06/2024   CL 103 06/06/2024   CO2 19 (L) 06/06/2024      Assessment/Plan: Acute CVA with a small potential hemorrhage in basal ganglia Embolic-in the setting of New onset A-fib Workup as above Exam stable-unchanged Eliquis started 9/28-seems to be tolerating well CIR consultation placed per PT/OT recommendations.  Awaiting CIR evaluation  PAF Remains in A-fib Rate controlled Telemetry monitoring Now on Eliquis.  Left distal clavicle fracture Sling/immobilizer in place No pain Outpatient orthopedic follow-up  HLD Lipitor dosage increased to 40 mg-this will be continued on discharge Continue Lovaza   History of mitral valve repair Valve appears stable on echo Outpatient cardiology follow-up.  HTN BP stable-amlodipine  on hold  Hospital delirium Appears mild Supportive care Add melatonin nightly Delirium precautions.  Code status:   Code Status: Limited: Do not attempt resuscitation (DNR) -DNR-LIMITED -Do Not Intubate/DNI    DVT Prophylaxis: SCD's Start: 06/06/24 0724-hold off on pharmacological prophylaxis due to petechial hemorrhage in the basal ganglia. apixaban (ELIQUIS) tablet 5 mg    Family Communication: Daughter-[name] Lynnea [phone] called 9/28   Disposition Plan: Status is: Inpatient Remains inpatient appropriate because: Severity of illness   Planned Discharge Destination:CIR   Diet: Diet Order             Diet Heart Room service appropriate? Yes; Fluid consistency: Thin  Diet effective now  Antimicrobial agents: Anti-infectives (From admission, onward)    Start     Dose/Rate Route Frequency Ordered Stop   06/05/24 1945  cefTRIAXone  (ROCEPHIN ) 1 g in sodium chloride  0.9 % 100 mL IVPB        1 g 200 mL/hr over 30 Minutes Intravenous  Once 06/05/24 1941 06/05/24 2248   06/05/24 1945  azithromycin  (ZITHROMAX ) 500 mg in sodium chloride  0.9 % 250 mL IVPB        500  mg 250 mL/hr over 60 Minutes Intravenous  Once 06/05/24 1941 06/06/24 0257        MEDICATIONS: Scheduled Meds:  apixaban  5 mg Oral BID   atorvastatin   40 mg Oral Daily   famotidine  20 mg Oral Daily   Influenza vac split trivalent PF  0.5 mL Intramuscular Tomorrow-1000   melatonin  5 mg Oral QHS   omega-3 acid ethyl esters  1 g Oral BID   Continuous Infusions: PRN Meds:.acetaminophen  **OR** acetaminophen  (TYLENOL ) oral liquid 160 mg/5 mL **OR** acetaminophen , guaiFENesin -dextromethorphan , haloperidol lactate   I have personally reviewed following labs and imaging studies  LABORATORY DATA: CBC: Recent Labs  Lab 06/05/24 1900 06/06/24 0907  WBC 13.4* 9.1  NEUTROABS 9.9*  --   HGB 14.5 13.4  HCT 42.9 39.6  MCV 89.7 91.9  PLT 272 102*    Basic Metabolic Panel: Recent Labs  Lab 06/05/24 1900 06/06/24 0907  NA 137 138  K 3.9 3.6  CL 99 103  CO2 24 19*  GLUCOSE 112* 97  BUN 13 8  CREATININE 0.76 0.65  CALCIUM  9.4 8.6*    GFR: Estimated Creatinine Clearance: 60.8 mL/min (by C-G formula based on SCr of 0.65 mg/dL).  Liver Function Tests: Recent Labs  Lab 06/05/24 1900 06/06/24 0907  AST 53* 34  ALT 23 22  ALKPHOS 45 40  BILITOT 1.8* 1.3*  PROT 7.2 6.3*  ALBUMIN 4.2 3.5   No results for input(s): LIPASE, AMYLASE in the last 168 hours. No results for input(s): AMMONIA in the last 168 hours.  Coagulation Profile: No results for input(s): INR, PROTIME in the last 168 hours.  Cardiac Enzymes: No results for input(s): CKTOTAL, CKMB, CKMBINDEX, TROPONINI in the last 168 hours.  BNP (last 3 results) No results for input(s): PROBNP in the last 8760 hours.  Lipid Profile: No results for input(s): CHOL, HDL, LDLCALC, TRIG, CHOLHDL, LDLDIRECT in the last 72 hours.   Thyroid Function Tests: No results for input(s): TSH, T4TOTAL, FREET4, T3FREE, THYROIDAB in the last 72 hours.  Anemia Panel: No results for  input(s): VITAMINB12, FOLATE, FERRITIN, TIBC, IRON, RETICCTPCT in the last 72 hours.  Urine analysis:    Component Value Date/Time   COLORURINE YELLOW 06/05/2024 1819   APPEARANCEUR CLEAR 06/05/2024 1819   LABSPEC 1.021 06/05/2024 1819   PHURINE 5.0 06/05/2024 1819   GLUCOSEU NEGATIVE 06/05/2024 1819   HGBUR NEGATIVE 06/05/2024 1819   BILIRUBINUR NEGATIVE 06/05/2024 1819   KETONESUR 20 (A) 06/05/2024 1819   PROTEINUR NEGATIVE 06/05/2024 1819   NITRITE NEGATIVE 06/05/2024 1819   LEUKOCYTESUR TRACE (A) 06/05/2024 1819    Sepsis Labs: Lactic Acid, Venous    Component Value Date/Time   LATICACIDVEN 1.5 06/05/2024 1907    MICROBIOLOGY: Recent Results (from the past 240 hours)  Culture, blood (routine x 2)     Status: None (Preliminary result)   Collection Time: 06/05/24  7:00 PM   Specimen: BLOOD  Result Value Ref Range Status   Specimen Description BLOOD LEFT ANTECUBITAL  Final   Special Requests   Final    BOTTLES DRAWN AEROBIC ONLY Blood Culture adequate volume   Culture   Final    NO GROWTH 4 DAYS Performed at [facility], [address] [place], [place] [address]    Report Status PENDING  Incomplete  Resp panel by RT-PCR (RSV, Flu A&B, Covid) Anterior Nasal Swab     Status: None   Collection Time: 06/05/24  7:43 PM   Specimen: Anterior Nasal Swab  Result Value Ref Range Status   SARS Coronavirus 2 by RT PCR NEGATIVE NEGATIVE Final   Influenza A by PCR NEGATIVE NEGATIVE Final   Influenza B by PCR NEGATIVE NEGATIVE Final    Comment: (NOTE) The Xpert Xpress SARS-CoV-2/FLU/RSV plus assay is intended as an aid in the diagnosis of influenza from Nasopharyngeal swab specimens and should not be used as a sole basis for treatment. Nasal washings and aspirates are unacceptable for Xpert Xpress SARS-CoV-2/FLU/RSV testing.  Fact Sheet for Patients: BloggerCourse.com  Fact Sheet for Healthcare  Providers: SeriousBroker.it  This test is not yet approved or cleared by the [place]  FDA and has been authorized for detection and/or diagnosis of SARS-CoV-2 by FDA under an Emergency Use Authorization (EUA). This EUA will remain in effect (meaning this test can be used) for the duration of the COVID-19 declaration under Section 564(b)(1) of the Act, 21 U.S.C. section 360bbb-3(b)(1), unless the authorization is terminated or revoked.     Resp Syncytial Virus by PCR NEGATIVE NEGATIVE Final    Comment: (NOTE) Fact Sheet for Patients: BloggerCourse.com  Fact Sheet for Healthcare Providers: SeriousBroker.it  This test is not yet approved or cleared by the [place]  FDA and has been authorized for detection and/or diagnosis of SARS-CoV-2 by FDA under an Emergency Use Authorization (EUA). This EUA will remain in effect (meaning this test can be used) for the duration of the COVID-19 declaration under Section 564(b)(1) of the Act, 21 U.S.C. section 360bbb-3(b)(1), unless the authorization is terminated or revoked.  Performed at [facility], [address] [place], [place] [address]   Culture, blood (routine x 2)     Status: None (Preliminary result)   Collection Time: 06/06/24  9:07 AM   Specimen: BLOOD RIGHT HAND  Result Value Ref Range Status   Specimen Description BLOOD RIGHT HAND  Final   Special Requests AEROBIC BOTTLE ONLY Blood Culture adequate volume  Final   Culture   Final    NO GROWTH 3 DAYS Performed at [facility], [address], [place], [place] [address]    Report Status PENDING  Incomplete    RADIOLOGY STUDIES/RESULTS: No results found.    LOS: 3 days   Donalda Applebaum, MD  [facility]    To contact the attending provider between 7A-7P or the covering provider during after hours 7P-7A, please log into the web site [email or web address] and access using  universal [place] password for that web site. If you do not have the password, please call the hospital operator.  06/09/2024, 9:14 AM

## 2024-06-09 NOTE — PMR Pre-admission (Incomplete)
 PMR Admission Coordinator Pre-Admission Assessment  Patient: Kyle [name] is an 89 y.o., male MRN: [id number] DOB: 1935-01-26 Height: 5' 7 (170.2 cm) Weight: 70.3 kg              Insurance Information HMO: yes    PPO:      PCP:      IPA:      80/20:      OTHER:  PRIMARY: [employer] Medicare      Policy#: [id number]      Subscriber: patient CM Name: ***      Phone#: ***     Fax#: [phone] Pre-Cert#: [id number]      Employer:  Benefits:  Phone #: [phone]     Name:  Eff. Date: 09/12/23-09/10/24     Deduct: $0 (does not have deductible)      Out of Pocket Max: $3,900 ($60 met)      Life Max: NA  CIR: $300/day co-pay for days 1-5, 100% coverage for days 6+      SNF: 100% coverage for days 1-20, $203 co-pay/day for days 21-100 Outpatient: $20 co-pay/visit     Co-Pay:  Home Health: 100% coverage      Co-Pay:  DME: 80% coverage     Co-Pay: 20% co-insurance Providers: in-network SECONDARY:       Policy#:       Phone#:   Artist:       Phone#:   The Data processing manager" for patients in Inpatient Rehabilitation Facilities with attached "Privacy Act Statement-Health Care Records" was provided and verbally reviewed with: {CHL IP Patient Family WJ:[id number]}  Emergency Contact Information Contact Information     Name Relation Home Work Mobile   [name],[name] Daughter   [phone]   CUINN, WESTERHOLD   [phone]      Other Contacts     Name Relation Home Work Mobile   Eckard,Mark Son   [phone]      Current Medical History  Patient Admitting Diagnosis: CVA History of Present Illness: Pt is an 89 year old male with medical hx significant for: BPH, CAD, HTN, HLD, mitral valve repair (2006), CVA (2007). Pt presented to [facility] on 06/05/24 d/t fall. Pt reported pain in area below right ribs. Imaging revealed acute/subacute right basal ganglia infarct. Left should x-ray showed nondisplaced fracture of left distal clavicle.  Placed in sling immobilizer. Neurology consulted. EKG showed A-fib and right bundle blanch block. CTA negative for LVO. MRI showed acute infarct in right basal ganglia with associated petechial hemorrhage. Also showed small acute embolic infarcts in right parietal cortex. CT C-spine negative for acute findings. CT chest/abdomen/pelvis negative for acute findings. Therapy evaluations completed and CIR recommended d/t pt's deficits in functional mobility. Complete NIHSS TOTAL: 1 Glasgow Coma Scale Score: 15  Patient's medical record from [facility] has been reviewed by the rehabilitation admission coordinator and physician.  Past Medical History  Past Medical History:  Diagnosis Date   Atrial fibrillation [facility])    Post operative, maintaining normal sinus rhythm on amiodarone   Benign prostatic hypertrophy    CAD (coronary artery disease)    Non obstructive by catheterization Feb 2007 prior to his surgery revealing a 40-50% LAD lesion, good LV function with EF 55-60% by last echocardiogram prior to his surgery   Hyperlipidemia    Treated   Mitral regurgitation    S/P mitral valve repair Oct 17, 2005 by Dr. Dusty   Skin cancer     Has the patient had  major surgery during 100 days prior to admission? No  Family History  family history includes COPD in his father; Cancer in his brother.   Current Medications   Current Facility-Administered Medications:    acetaminophen  (TYLENOL ) tablet 650 mg, 650 mg, Oral, Q6H PRN, 650 mg at 06/08/24 0101 **OR** acetaminophen  (TYLENOL ) 160 MG/5ML solution 650 mg, 650 mg, Per Tube, Q6H PRN **OR** acetaminophen  (TYLENOL ) suppository 650 mg, 650 mg, Rectal, Q6H PRN, Rathore, Vasundhra, MD   apixaban (ELIQUIS) tablet 5 mg, 5 mg, Oral, BID, Dodson, Andrew J, RPH, 5 mg at 06/09/24 9167   atorvastatin  (LIPITOR) tablet 40 mg, 40 mg, Oral, Daily, Jerri Pfeiffer, MD, 40 mg at 06/09/24 0831   famotidine (PEPCID) tablet 20 mg, 20 mg, Oral, Daily, Ghimire, Shanker  M, MD, 20 mg at 06/09/24 0831   guaiFENesin -dextromethorphan  (ROBITUSSIN DM) 100-10 MG/5ML syrup 15 mL, 15 mL, Oral, Q4H PRN, Rathore, Vasundhra, MD, 15 mL at 06/08/24 0945   haloperidol lactate (HALDOL) injection 2 mg, 2 mg, Intravenous, Q6H PRN, Ghimire, Donalda HERO, MD   Influenza vac split trivalent PF (FLUZONE HIGH-DOSE) injection 0.5 mL, 0.5 mL, Intramuscular, Tomorrow-1000, Ghimire, Shanker M, MD   melatonin tablet 5 mg, 5 mg, Oral, QHS, Ghimire, Shanker M, MD, 5 mg at 06/08/24 2101   omega-3 acid ethyl esters (LOVAZA ) capsule 1 g, 1 g, Oral, BID, Rathore, Vasundhra, MD, 1 g at 06/09/24 0831  Patients Current Diet:  Diet Order             Diet Heart Room service appropriate? Yes; Fluid consistency: Thin  Diet effective now                   Precautions / Restrictions Precautions Precautions: Fall Restrictions Weight Bearing Restrictions Per Provider Order: No   Has the patient had 2 or more falls or a fall with injury in the past year?Yes  Prior Activity Level Community (5-7x/wk): drives, gets out of house often  Prior Functional Level Prior Function Prior Level of Function : Independent/Modified Independent, History of Falls (last six months), Driving Mobility Comments: Ind without an AD ADLs Comments: Ind with ADLs and IADLs; drives; enjoys doing yard work  Self Care: Did the patient need help bathing, dressing, using the toilet or eating?  Independent  Indoor Mobility: Did the patient need assistance with walking from room to room (with or without device)? Independent  Stairs: Did the patient need assistance with internal or external stairs (with or without device)? Independent  Functional Cognition: Did the patient need help planning regular tasks such as shopping or remembering to take medications? Independent  Patient Information Are you of Hispanic, Latino/a,or Spanish origin?: A. No, not of Hispanic, Latino/a, or Spanish origin What is your race?: A.  White Do you need or want an interpreter to communicate with a doctor or health care staff?: 0. No  Patient's Response To:  Health Literacy and Transportation Is the patient able to respond to health literacy and transportation needs?: Yes Health Literacy - How often do you need to have someone help you when you read instructions, pamphlets, or other written material from your doctor or pharmacy?: Never In the past 12 months, has lack of transportation kept you from medical appointments or from getting medications?: No In the past 12 months, has lack of transportation kept you from meetings, work, or from getting things needed for daily living?: No  Home Assistive Devices / Equipment Home Equipment: None  Prior Device Use: Indicate devices/aids used by the  patient prior to current illness, exacerbation or injury? None of the above  Current Functional Level Cognition  Overall Cognitive Status: Impaired/Different from baseline Orientation Level: Oriented X4    Extremity Assessment (includes Sensation/Coordination)  Upper Extremity Assessment: Right hand dominant, LUE deficits/detail (R UE overall WFL) LUE Deficits / Details: L shoulder fx; AROM of elbow, wrist, and hand WFL; decreased coordination; decreased proprioception; shoulder ROM not tested secondary to awaiting Ortho consult; pt with no WB restrictions per MD; pt with L UE sling LUE Sensation: decreased proprioception LUE Coordination: decreased fine motor, decreased gross motor  Lower Extremity Assessment: Defer to PT evaluation    ADLs  Overall ADL's : Needs assistance/impaired Eating/Feeding: Set up, Sitting Grooming: Minimal assistance, Sitting, Cueing for compensatory techniques Upper Body Bathing: Moderate assistance, Maximal assistance, Cueing for sequencing, Cueing for compensatory techniques Lower Body Bathing: Maximal assistance, Cueing for compensatory techniques, Sitting/lateral leans, Sit to/from stand, Cueing for  sequencing, Cueing for safety Upper Body Dressing : Minimal assistance, Moderate assistance, Sitting, Cueing for sequencing, Cueing for compensatory techniques Lower Body Dressing: Moderate assistance, Maximal assistance, Sitting/lateral leans, Sit to/from stand, Cueing for compensatory techniques, Cueing for safety    Mobility  Overal bed mobility: Needs Assistance Bed Mobility: Supine to Sit, Sit to Supine Supine to sit: Min assist, +2 for physical assistance, +2 for safety/equipment Sit to supine: Min assist, +2 for physical assistance, +2 for safety/equipment General bed mobility comments: Assist to manage truck/support L shoulder; cue for hand placement/technique and sequencing    Transfers  Overall transfer level: Needs assistance Equipment used: 1 person hand held assist Transfers: Sit to/from Stand Sit to Stand: Min assist General transfer comment: Pt declined functional transfers this session with no reason given. Pt requiring Min assist for transfers with PT on 9/27.    Ambulation / Gait / Stairs / Wheelchair Mobility  Ambulation/Gait Ambulation/Gait assistance: Editor, commissioning (Feet): 75 Feet Assistive device: 1 person hand held assist Gait Pattern/deviations: Ataxic, Narrow base of support, Decreased stride length, Step-through pattern General Gait Details: Pt noted with very narrow ataxic gait pattern. no LOB noted. Gait velocity: decreased    Posture / Balance Balance Overall balance assessment: Needs assistance Sitting-balance support: Single extremity supported, Feet supported Sitting balance-Leahy Scale: Good Standing balance support: Bilateral upper extremity supported, During functional activity Standing balance-Leahy Scale: Poor Standing balance comment: Reliant on HHA    Special considerations/ Life events Skin Abrasion: arm, head/left; Ecchymosis: arm, head/bilateral; Scratch Marks: arm/bilateral and Bladder incontinence     Previous Home  Environment (from acute therapy documentation) Living Arrangements: Alone  Lives With: Alone (wife died 2 years ago) Available Help at Discharge: Family, Available 24 hours/day Type of Home: House Home Layout: One level Home Access: Stairs to enter Entrance Stairs-Rails: None Entrance Stairs-Number of Steps: 1 Bathroom Shower/Tub: Psychologist, counselling, Engineer, manufacturing systems: Standard Bathroom Accessibility: Yes How Accessible: Accessible via walker Home Care Services: No Additional Comments: Family reports they can coordinate 24/7 care for pt post discharge temporarily.  Discharge Living Setting Plans for Discharge Living Setting: Patient's home Type of Home at Discharge: House Discharge Home Layout: One level Discharge Home Access: Stairs to enter Entrance Stairs-Rails: None Entrance Stairs-Number of Steps: 1 Discharge Bathroom Shower/Tub: Walk-in shower, Tub/shower unit Discharge Bathroom Toilet: Standard Discharge Bathroom Accessibility: Yes How Accessible: Accessible via walker Does the patient have any problems obtaining your medications?: No  Social/Family/Support Systems Anticipated Caregiver: Marval Brazen (daughter) and other family Anticipated Caregiver's Contact Information: [name]: [phone] Caregiver Availability:  24/7 Discharge Plan Discussed with Primary Caregiver: Yes Is Caregiver In Agreement with Plan?: Yes Does Caregiver/Family have Issues with Lodging/Transportation while Pt is in Rehab?: No   Goals Patient/Family Goal for Rehab: *** Expected length of stay: *** Pt/Family Agrees to Admission and willing to participate: Yes Program Orientation Provided & Reviewed with Pt/Caregiver Including Roles  & Responsibilities: Yes   Decrease burden of Care through IP rehab admission: NA   Possible need for SNF placement upon discharge:Not anticipated   Patient Condition: {PATIENT'S CONDITION:22832}  Preadmission Screen Completed By:  Tinnie SHAUNNA Yvone Delayne, CCC-SLP, 06/09/2024 12:44 PM ______________________________________________________________________   Discussed status with Dr. PIERRETTEon***at *** and received approval for admission today.  Admission Coordinator:  Tinnie SHAUNNA Yvone Delayne, time***/Date***

## 2024-06-09 NOTE — Plan of Care (Signed)

## 2024-06-10 ENCOUNTER — Inpatient Hospital Stay (HOSPITAL_COMMUNITY)
Admission: AD | Admit: 2024-06-10 | Discharge: 2024-06-21 | DRG: 057 | Disposition: A | Discharge status: Home Health | Source: Intra-hospital | Attending: Physical Medicine & Rehabilitation | Admitting: Physical Medicine & Rehabilitation

## 2024-06-10 ENCOUNTER — Other Ambulatory Visit: Payer: Self-pay

## 2024-06-10 ENCOUNTER — Encounter (HOSPITAL_COMMUNITY): Payer: Self-pay | Admitting: Physical Medicine & Rehabilitation

## 2024-06-10 DIAGNOSIS — S42035D Nondisplaced fracture of lateral end of left clavicle, subsequent encounter for fracture with routine healing: Secondary | ICD-10-CM

## 2024-06-10 DIAGNOSIS — Z85828 Personal history of other malignant neoplasm of skin: Secondary | ICD-10-CM | POA: Diagnosis not present

## 2024-06-10 DIAGNOSIS — Z880 Allergy status to penicillin: Secondary | ICD-10-CM

## 2024-06-10 DIAGNOSIS — Z808 Family history of malignant neoplasm of other organs or systems: Secondary | ICD-10-CM

## 2024-06-10 DIAGNOSIS — I4891 Unspecified atrial fibrillation: Secondary | ICD-10-CM | POA: Diagnosis not present

## 2024-06-10 DIAGNOSIS — I639 Cerebral infarction, unspecified: Secondary | ICD-10-CM | POA: Diagnosis not present

## 2024-06-10 DIAGNOSIS — Z7982 Long term (current) use of aspirin: Secondary | ICD-10-CM

## 2024-06-10 DIAGNOSIS — N400 Enlarged prostate without lower urinary tract symptoms: Secondary | ICD-10-CM | POA: Diagnosis present

## 2024-06-10 DIAGNOSIS — E871 Hypo-osmolality and hyponatremia: Secondary | ICD-10-CM | POA: Diagnosis not present

## 2024-06-10 DIAGNOSIS — I69398 Other sequelae of cerebral infarction: Secondary | ICD-10-CM

## 2024-06-10 DIAGNOSIS — M19012 Primary osteoarthritis, left shoulder: Secondary | ICD-10-CM | POA: Diagnosis not present

## 2024-06-10 DIAGNOSIS — D696 Thrombocytopenia, unspecified: Secondary | ICD-10-CM | POA: Diagnosis present

## 2024-06-10 DIAGNOSIS — I480 Paroxysmal atrial fibrillation: Secondary | ICD-10-CM | POA: Diagnosis not present

## 2024-06-10 DIAGNOSIS — E782 Mixed hyperlipidemia: Secondary | ICD-10-CM | POA: Diagnosis not present

## 2024-06-10 DIAGNOSIS — R2689 Other abnormalities of gait and mobility: Secondary | ICD-10-CM | POA: Diagnosis present

## 2024-06-10 DIAGNOSIS — Z87891 Personal history of nicotine dependence: Secondary | ICD-10-CM | POA: Diagnosis not present

## 2024-06-10 DIAGNOSIS — I69354 Hemiplegia and hemiparesis following cerebral infarction affecting left non-dominant side: Secondary | ICD-10-CM | POA: Diagnosis not present

## 2024-06-10 DIAGNOSIS — D649 Anemia, unspecified: Secondary | ICD-10-CM | POA: Diagnosis not present

## 2024-06-10 DIAGNOSIS — H510 Palsy (spasm) of conjugate gaze: Secondary | ICD-10-CM | POA: Diagnosis present

## 2024-06-10 DIAGNOSIS — I2510 Atherosclerotic heart disease of native coronary artery without angina pectoris: Secondary | ICD-10-CM | POA: Diagnosis present

## 2024-06-10 DIAGNOSIS — S42009A Fracture of unspecified part of unspecified clavicle, initial encounter for closed fracture: Secondary | ICD-10-CM | POA: Diagnosis present

## 2024-06-10 DIAGNOSIS — E785 Hyperlipidemia, unspecified: Secondary | ICD-10-CM | POA: Diagnosis not present

## 2024-06-10 DIAGNOSIS — R7301 Impaired fasting glucose: Secondary | ICD-10-CM | POA: Diagnosis present

## 2024-06-10 DIAGNOSIS — Z860100 Personal history of colon polyps, unspecified: Secondary | ICD-10-CM | POA: Diagnosis not present

## 2024-06-10 DIAGNOSIS — I69392 Facial weakness following cerebral infarction: Secondary | ICD-10-CM | POA: Diagnosis not present

## 2024-06-10 DIAGNOSIS — R7989 Other specified abnormal findings of blood chemistry: Secondary | ICD-10-CM | POA: Diagnosis present

## 2024-06-10 DIAGNOSIS — Z79899 Other long term (current) drug therapy: Secondary | ICD-10-CM | POA: Diagnosis not present

## 2024-06-10 DIAGNOSIS — I69393 Ataxia following cerebral infarction: Secondary | ICD-10-CM

## 2024-06-10 DIAGNOSIS — Z952 Presence of prosthetic heart valve: Secondary | ICD-10-CM | POA: Diagnosis not present

## 2024-06-10 DIAGNOSIS — H9190 Unspecified hearing loss, unspecified ear: Secondary | ICD-10-CM | POA: Diagnosis not present

## 2024-06-10 DIAGNOSIS — Z825 Family history of asthma and other chronic lower respiratory diseases: Secondary | ICD-10-CM | POA: Diagnosis not present

## 2024-06-10 DIAGNOSIS — H538 Other visual disturbances: Secondary | ICD-10-CM | POA: Diagnosis present

## 2024-06-10 DIAGNOSIS — I10 Essential (primary) hypertension: Secondary | ICD-10-CM | POA: Diagnosis present

## 2024-06-10 DIAGNOSIS — Z7901 Long term (current) use of anticoagulants: Secondary | ICD-10-CM

## 2024-06-10 DIAGNOSIS — I6381 Other cerebral infarction due to occlusion or stenosis of small artery: Secondary | ICD-10-CM | POA: Diagnosis not present

## 2024-06-10 LAB — CULTURE, BLOOD (ROUTINE X 2)
Culture: NO GROWTH
Special Requests: ADEQUATE

## 2024-06-10 MED ORDER — PROCHLORPERAZINE MALEATE 5 MG PO TABS: 5.0000 mg | ORAL_TABLET | Freq: Four times a day (QID) | ORAL | Status: DC | PRN

## 2024-06-10 MED ORDER — PROCHLORPERAZINE 25 MG RE SUPP: 12.5000 mg | Freq: Four times a day (QID) | RECTAL | Status: DC | PRN

## 2024-06-10 MED ORDER — TRAZODONE HCL 50 MG PO TABS
25.0000 mg | ORAL_TABLET | Freq: Every evening | ORAL | Status: DC | PRN
  Administered 2024-06-11 – 2024-06-12 (×2): 50 mg via ORAL
  Administered 2024-06-14 – 2024-06-15 (×2): 25 mg via ORAL
  Administered 2024-06-16 – 2024-06-20 (×5): 50 mg via ORAL
  Filled 2024-06-10 (×10): qty 1

## 2024-06-10 MED ORDER — MELATONIN 5 MG PO TABS: 5.0000 mg | ORAL_TABLET | Freq: Every day | ORAL | Status: DC

## 2024-06-10 MED ORDER — FLEET ENEMA RE ENEM: 1.0000 | ENEMA | Freq: Once | RECTAL | Status: DC | PRN

## 2024-06-10 MED ORDER — FAMOTIDINE 20 MG PO TABS: 20.0000 mg | ORAL_TABLET | Freq: Every day | ORAL | Status: DC

## 2024-06-10 MED ORDER — ACETAMINOPHEN 325 MG PO TABS
325.0000 mg | ORAL_TABLET | ORAL | Status: DC | PRN
  Administered 2024-06-11: 325 mg via ORAL
  Administered 2024-06-14: 650 mg via ORAL
  Filled 2024-06-10: qty 1
  Filled 2024-06-10: qty 2

## 2024-06-10 MED ORDER — ATORVASTATIN CALCIUM 40 MG PO TABS: 40.0000 mg | ORAL_TABLET | Freq: Every day | ORAL | Status: DC

## 2024-06-10 MED ORDER — BISACODYL 10 MG RE SUPP: 10.0000 mg | Freq: Every day | RECTAL | Status: DC | PRN

## 2024-06-10 MED ORDER — MELATONIN 5 MG PO TABS
5.0000 mg | ORAL_TABLET | Freq: Every day | ORAL | Status: DC
  Administered 2024-06-10 – 2024-06-20 (×10): 5 mg via ORAL
  Filled 2024-06-10 (×11): qty 1

## 2024-06-10 MED ORDER — ATORVASTATIN CALCIUM 40 MG PO TABS
40.0000 mg | ORAL_TABLET | Freq: Every day | ORAL | Status: DC
  Administered 2024-06-11 – 2024-06-21 (×11): 40 mg via ORAL
  Filled 2024-06-10 (×12): qty 1

## 2024-06-10 MED ORDER — ALUM & MAG HYDROXIDE-SIMETH 200-200-20 MG/5ML PO SUSP
30.0000 mL | ORAL | Status: DC | PRN
  Administered 2024-06-14 – 2024-06-15 (×2): 30 mL via ORAL
  Filled 2024-06-10 (×2): qty 30

## 2024-06-10 MED ORDER — DIPHENHYDRAMINE HCL 25 MG PO CAPS: 25.0000 mg | ORAL_CAPSULE | Freq: Four times a day (QID) | ORAL | Status: DC | PRN

## 2024-06-10 MED ORDER — GUAIFENESIN-DM 100-10 MG/5ML PO SYRP
5.0000 mL | ORAL_SOLUTION | Freq: Four times a day (QID) | ORAL | Status: DC | PRN
  Administered 2024-06-10: 10 mL via ORAL
  Filled 2024-06-10: qty 10

## 2024-06-10 MED ORDER — PROCHLORPERAZINE EDISYLATE 10 MG/2ML IJ SOLN: 5.0000 mg | Freq: Four times a day (QID) | INTRAMUSCULAR | Status: DC | PRN

## 2024-06-10 MED ORDER — APIXABAN 5 MG PO TABS
5.0000 mg | ORAL_TABLET | Freq: Two times a day (BID) | ORAL | Status: DC
  Administered 2024-06-10 – 2024-06-21 (×22): 5 mg via ORAL
  Filled 2024-06-10 (×22): qty 1

## 2024-06-10 MED ORDER — APIXABAN 5 MG PO TABS: 5.0000 mg | ORAL_TABLET | Freq: Two times a day (BID) | ORAL | Status: DC

## 2024-06-10 MED ORDER — FAMOTIDINE 20 MG PO TABS
20.0000 mg | ORAL_TABLET | Freq: Every day | ORAL | Status: DC
Start: 2024-06-11 — End: 2024-06-21
  Administered 2024-06-11 – 2024-06-21 (×11): 20 mg via ORAL
  Filled 2024-06-10 (×11): qty 1

## 2024-06-10 MED ORDER — OMEGA-3-ACID ETHYL ESTERS 1 G PO CAPS
1.0000 g | ORAL_CAPSULE | Freq: Two times a day (BID) | ORAL | Status: DC
  Administered 2024-06-10 – 2024-06-21 (×21): 1 g via ORAL
  Filled 2024-06-10 (×22): qty 1

## 2024-06-10 NOTE — PMR Pre-admission (Signed)
 PMR Admission Coordinator Pre-Admission Assessment  Patient: Kyle [name] is an 89 y.o., male MRN: [id number] DOB: [birth year] Height: 5' 7 (170.2 cm) Weight: 70.3 kg  Insurance Information HMO: yes    PPO:      PCP:      IPA:      80/20:      OTHER:  PRIMARY: [employer] Medicare      Policy#: [id number]      Subscriber: patient CM Name: TBD      Phone#: TBD     Fax#: [phone] Pre-Cert#: [id number]  Received insurance authorization via voicemail from [place] on 06/09/24. Pt approved from 06/09/24-06/16/24. Next review date is 06/16/24.   Employer:  Benefits:  Phone #: [phone]     Name:  Eff. Date: 09/12/23-09/10/24     Deduct: $0 (does not have deductible)      Out of Pocket Max: $3,900 ($60 met)      Life Max: NA CIR: $300/day co-pay for days 1-5, 100% coverage for days 6+      SNF: 100% coverage for days 1-20, $203 co-pay/day for days 21-100 Outpatient: $20 co-pay/visit     Co-Pay:  Home Health: 100% coverage      Co-Pay:  DME: 80% coverage     Co-Pay: 20% co-insurance Providers: in-network SECONDARY:       Policy#:      Phone#:   Artist:       Phone#:   The Data processing manager" for patients in Inpatient Rehabilitation Facilities with attached "Privacy Act Statement-Health Care Records" was provided and verbally reviewed with: Patient and Family  Emergency Contact Information Contact Information     Name Relation Home Work Mobile   [name],[name] Daughter   [phone]   [name], [name]   [phone]      Other Contacts     Name Relation Home Work Mobile   Sanjuan,[name] Son   [phone]       Current Medical History  Patient Admitting Diagnosis: CVA History of Present Illness: Pt is an 89 year old male with medical hx significant for: BPH, CAD, HTN, HLD, mitral valve repair (2006), CVA (2007). Pt [facility] to [facility] on [facility]24 d/t fall. Pt reported pain in area below right ribs. Imaging revealed  acute/subacute right basal ganglia infarct. Left should x-ray showed nondisplaced fracture of left distal clavicle. Placed in sling immobilizer. Neurology consulted. EKG showed A-fib and right bundle blanch block. CTA negative for LVO. MRI showed acute infarct in right basal ganglia with associated petechial hemorrhage. Also showed small acute embolic infarcts in right parietal cortex. CT C-spine negative for acute findings. CT chest/abdomen/pelvis negative for acute findings. Therapy evaluations completed and CIR recommended d/t pt's deficits in functional mobility.  Complete NIHSS TOTAL: 1  Patient's medical record from [facility] has been reviewed by the rehabilitation admission coordinator and physician.  Past Medical History  Past Medical History:  Diagnosis Date   Atrial fibrillation [facility])    Post operative, maintaining normal sinus rhythm on amiodarone   Benign prostatic hypertrophy    CAD (coronary artery disease)    Non obstructive by catheterization Feb 2007 prior to his surgery revealing a 40-50% LAD lesion, good LV function with EF 55-60% by last echocardiogram prior to his surgery   Hyperlipidemia    Treated   Mitral regurgitation    S/P mitral valve repair Oct 17, 2005 by Dr. Dusty   Skin cancer     Has the patient had major surgery during  100 days prior to admission? No  Family History   family history includes COPD in his father; Cancer in his brother.  Current Medications  Current Facility-Administered Medications:    acetaminophen  (TYLENOL ) tablet 650 mg, 650 mg, Oral, Q6H PRN, 650 mg at 06/08/24 0101 **OR** acetaminophen  (TYLENOL ) 160 MG/5ML solution 650 mg, 650 mg, Per Tube, Q6H PRN **OR** acetaminophen  (TYLENOL ) suppository 650 mg, 650 mg, Rectal, Q6H PRN, Rathore, Vasundhra, MD   apixaban (ELIQUIS) tablet 5 mg, 5 mg, Oral, BID, Dodson, Andrew J, RPH, 5 mg at 06/10/24 1037   atorvastatin  (LIPITOR) tablet 40 mg, 40 mg, Oral, Daily, Jerri Pfeiffer, MD, 40 mg at  06/10/24 1037   famotidine (PEPCID) tablet 20 mg, 20 mg, Oral, Daily, Ghimire, Shanker M, MD, 20 mg at 06/10/24 1037   guaiFENesin -dextromethorphan  (ROBITUSSIN DM) 100-10 MG/5ML syrup 15 mL, 15 mL, Oral, Q4H PRN, Rathore, Vasundhra, MD, 15 mL at 06/10/24 1050   haloperidol lactate (HALDOL) injection 2 mg, 2 mg, Intravenous, Q6H PRN, Ghimire, Donalda HERO, MD   melatonin tablet 5 mg, 5 mg, Oral, QHS, Ghimire, Shanker M, MD, 5 mg at 06/09/24 1958   omega-3 acid ethyl esters (LOVAZA ) capsule 1 g, 1 g, Oral, BID, Rathore, Vasundhra, MD, 1 g at 06/10/24 1037  Patients Current Diet:  Diet Order             Diet Heart Room service appropriate? Yes; Fluid consistency: Thin  Diet effective now                   Precautions / Restrictions Precautions Precautions: Fall Restrictions Weight Bearing Restrictions Per Provider Order: No   Has the patient had 2 or more falls or a fall with injury in the past year? Yes  Prior Activity Level Community (5-7x/wk): drives, gets out of house often  Prior Functional Level Self Care: Did the patient need help bathing, dressing, using the toilet or eating? Independent  Indoor Mobility: Did the patient need assistance with walking from room to room (with or without device)? Independent  Stairs: Did the patient need assistance with internal or external stairs (with or without device)? Independent  Functional Cognition: Did the patient need help planning regular tasks such as shopping or remembering to take medications? Independent  Patient Information Are you of Hispanic, Latino/a,or Spanish origin?: A. No, not of Hispanic, Latino/a, or Spanish origin What is your race?: A. White Do you need or want an interpreter to communicate with a doctor or health care staff?: 0. No  Patient's Response To:  Health Literacy and Transportation Is the patient able to respond to health literacy and transportation needs?: Yes Health Literacy - How often do you need  to have someone help you when you read instructions, pamphlets, or other written material from your doctor or pharmacy?: Never In the past 12 months, has lack of transportation kept you from medical appointments or from getting medications?: No In the past 12 months, has lack of transportation kept you from meetings, work, or from getting things needed for daily living?: No  Home Assistive Devices / Equipment Home Equipment: None  Prior Device Use: Indicate devices/aids used by the patient prior to current illness, exacerbation or injury? None of the above  Current Functional Level Cognition  Overall Cognitive Status: Impaired/Different from baseline Orientation Level: Oriented to situation    Extremity Assessment (includes Sensation/Coordination)  Upper Extremity Assessment: Right hand dominant, LUE deficits/detail (R UE overall WFL) LUE Deficits / Details: L shoulder fx; AROM of elbow,  wrist, and hand WFL; decreased coordination; decreased proprioception; shoulder ROM not tested secondary to awaiting Ortho consult; pt with no WB restrictions per MD; pt with L UE sling LUE Sensation: decreased proprioception LUE Coordination: decreased fine motor, decreased gross motor  Lower Extremity Assessment: Defer to PT evaluation    ADLs  Overall ADL's : Needs assistance/impaired Eating/Feeding: Set up, Sitting Grooming: Minimal assistance, Sitting, Cueing for compensatory techniques Upper Body Bathing: Moderate assistance, Maximal assistance, Cueing for sequencing, Cueing for compensatory techniques Lower Body Bathing: Maximal assistance, Cueing for compensatory techniques, Sitting/lateral leans, Sit to/from stand, Cueing for sequencing, Cueing for safety Upper Body Dressing : Minimal assistance, Moderate assistance, Sitting, Cueing for sequencing, Cueing for compensatory techniques Lower Body Dressing: Moderate assistance, Maximal assistance, Sitting/lateral leans, Sit to/from stand, Cueing for  compensatory techniques, Cueing for safety    Mobility  Overal bed mobility: Needs Assistance Bed Mobility: Supine to Sit, Sit to Supine Supine to sit: Min assist, +2 for physical assistance, +2 for safety/equipment Sit to supine: Min assist, +2 for physical assistance, +2 for safety/equipment General bed mobility comments: pt received sitting up in chair, returned to chair    Transfers  Overall transfer level: Needs assistance Equipment used: 1 person hand held assist Transfers: Sit to/from Stand Sit to Stand: Min assist General transfer comment: minA to power up as pt unable to use L UE functionally at this time    Ambulation / Gait / Stairs / Wheelchair Mobility  Ambulation/Gait Ambulation/Gait assistance: Editor, commissioning (Feet): 150 Feet Assistive device: 1 person hand held assist Gait Pattern/deviations: Ataxic, Narrow base of support, Decreased stride length, Step-through pattern General Gait Details: Pt with improved width of base of support and consistent step height and length compared to saturday. Pt with progressive R UE dependency with increased ambulation distance. Gait velocity: decreased    Posture / Balance Balance Overall balance assessment: Needs assistance Sitting-balance support: Single extremity supported, Feet supported Sitting balance-Leahy Scale: Good Standing balance support: Bilateral upper extremity supported, During functional activity Standing balance-Leahy Scale: Poor Standing balance comment: Reliant on HHA    Special considerations/life events  Skin Abrasion: arm, head/left; Ecchymosis: arm, head/bilateral; Scratch Marks: arm/bilateral and Bladder incontinence   Previous Home Environment (from acute therapy documentation) Living Arrangements: Alone  Lives With: Alone (wife died 2 years ago) Available Help at Discharge: Family, Available 24 hours/day Type of Home: House Home Layout: One level Home Access: Stairs to enter Entrance  Stairs-Rails: None Entrance Stairs-Number of Steps: 1 Bathroom Shower/Tub: Psychologist, counselling, Engineer, manufacturing systems: Standard Bathroom Accessibility: Yes How Accessible: Accessible via walker Home Care Services: No Additional Comments: Family reports they can coordinate 24/7 care for pt post discharge temporarily.  Discharge Living Setting Plans for Discharge Living Setting: Patient's home Type of Home at Discharge: House Discharge Home Layout: One level Discharge Home Access: Stairs to enter Entrance Stairs-Rails: None Entrance Stairs-Number of Steps: 1 Discharge Bathroom Shower/Tub: Walk-in shower, Tub/shower unit Discharge Bathroom Toilet: Standard Discharge Bathroom Accessibility: Yes How Accessible: Accessible via walker Does the patient have any problems obtaining your medications?: No  Social/Family/Support Systems Anticipated Caregiver: Marval Brazen (daughter) and other family Anticipated Caregiver's Contact Information: [name]: [phone] Caregiver Availability: 24/7 Discharge Plan Discussed with Primary Caregiver: Yes Is Caregiver In Agreement with Plan?: Yes Does Caregiver/Family have Issues with Lodging/Transportation while Pt is in Rehab?: No  Goals Patient/Family Goal for Rehab: Supervision: PT, Supervision-Min A:OT, Mod I: ST Expected length of stay: 10-14 days Pt/Family Agrees to Admission and willing  to participate: Yes Program Orientation Provided & Reviewed with Pt/Caregiver Including Roles  & Responsibilities: Yes  Decrease burden of Care through IP rehab admission: NA  Possible need for SNF placement upon discharge: Not anticipated  Patient Condition: This patient's condition remains as documented in the consult dated 06/09/24, in which the Rehabilitation Physician determined and documented that the patient's condition is appropriate for intensive rehabilitative care in an inpatient rehabilitation facility. Will admit to inpatient rehab  today.  Preadmission Screen Completed By:  Tinnie SHAUNNA Yvone Delayne, 06/10/2024 11:53 AM ______________________________________________________________________   Discussed status with Dr. Rashid Whitenight on 06/10/24  at 11:53 AM and received approval for admission today.  Admission Coordinator:  Tinnie SHAUNNA Yvone Delayne, CCC-SLP, time 11:53 AM/Date 06/10/24    Assessment/Plan: Diagnosis: R basal gabglia stroke with petechial hemorrhage Does the need for close, 24 hr/day Medical supervision in concert with the patient's rehab needs make it unreasonable for this patient to be served in a less intensive setting? Yes Co-Morbidities requiring supervision/potential complications: Afib, prior CVA, MVR 2006; HTN, HLD; L shoulder/humeral fx NWB LUE, delirium Due to bladder management, bowel management, safety, skin/wound care, disease management, medication administration, pain management, and patient education, does the patient require 24 hr/day rehab nursing? Yes Does the patient require coordinated care of a physician, rehab nurse, PT, OT, and SLP to address physical and functional deficits in the context of the above medical diagnosis(es)? Yes Addressing deficits in the following areas: balance, endurance, locomotion, strength, transferring, bowel/bladder control, bathing, dressing, feeding, grooming, toileting, and cognition Can the patient actively participate in an intensive therapy program of at least 3 hrs of therapy 5 days a week? Yes The potential for patient to make measurable gains while on inpatient rehab is good Anticipated functional outcomes upon discharge from inpatient rehab: supervision PT, modified independent and supervision OT, modified independent SLP Estimated rehab length of stay to reach the above functional goals is: 10-14 days Anticipated discharge destination: Home 10. Overall Rehab/Functional Prognosis: good   MD Signature:

## 2024-06-10 NOTE — Plan of Care (Signed)
  Problem: Education: Goal: Knowledge of disease or condition will improve 06/10/2024 1000 by Con Marylen SAUNDERS, RN Outcome: Progressing 06/10/2024 0927 by Con Marylen SAUNDERS, RN Outcome: Progressing Goal: Knowledge of secondary prevention will improve (MUST DOCUMENT ALL) 06/10/2024 1000 by Con Marylen SAUNDERS, RN Outcome: Progressing 06/10/2024 0927 by Con Marylen SAUNDERS, RN Outcome: Progressing Goal: Knowledge of patient specific risk factors will improve (DELETE if not current risk factor) 06/10/2024 1000 by Con Marylen SAUNDERS, RN Outcome: Progressing 06/10/2024 0927 by Con Marylen SAUNDERS, RN Outcome: Progressing   Problem: Ischemic Stroke/TIA Tissue Perfusion: Goal: Complications of ischemic stroke/TIA will be minimized 06/10/2024 1000 by Con Marylen SAUNDERS, RN Outcome: Progressing 06/10/2024 0927 by Con Marylen SAUNDERS, RN Outcome: Progressing   Problem: Coping: Goal: Will verbalize positive feelings about self 06/10/2024 1000 by Con Marylen SAUNDERS, RN Outcome: Progressing 06/10/2024 0927 by Con Marylen SAUNDERS, RN Outcome: Progressing Goal: Will identify appropriate support needs 06/10/2024 1000 by Con Marylen SAUNDERS, RN Outcome: Progressing 06/10/2024 0927 by Con Marylen SAUNDERS, RN Outcome: Progressing   Problem: Health Behavior/Discharge Planning: Goal: Ability to manage health-related needs will improve 06/10/2024 1000 by Con Marylen SAUNDERS, RN Outcome: Progressing 06/10/2024 0927 by Con Marylen SAUNDERS, RN Outcome: Progressing Goal: Goals will be collaboratively established with patient/family 06/10/2024 1000 by Con Marylen SAUNDERS, RN Outcome: Progressing 06/10/2024 0927 by Con Marylen SAUNDERS, RN Outcome: Progressing   Problem: Self-Care: Goal: Ability to participate in self-care as condition permits will improve Outcome: Progressing Goal: Verbalization of feelings and concerns over difficulty with self-care will improve Outcome: Progressing Goal: Ability to communicate  needs accurately will improve Outcome: Progressing   Problem: Nutrition: Goal: Risk of aspiration will decrease Outcome: Progressing Goal: Dietary intake will improve Outcome: Progressing   Problem: Education: Goal: Knowledge of General Education information will improve Description: Including pain rating scale, medication(s)/side effects and non-pharmacologic comfort measures Outcome: Progressing   Problem: Health Behavior/Discharge Planning: Goal: Ability to manage health-related needs will improve Outcome: Progressing   Problem: Clinical Measurements: Goal: Ability to maintain clinical measurements within normal limits will improve Outcome: Progressing Goal: Will remain free from infection Outcome: Progressing Goal: Diagnostic test results will improve Outcome: Progressing Goal: Respiratory complications will improve Outcome: Progressing Goal: Cardiovascular complication will be avoided Outcome: Progressing   Problem: Activity: Goal: Risk for activity intolerance will decrease Outcome: Progressing   Problem: Nutrition: Goal: Adequate nutrition will be maintained Outcome: Progressing   Problem: Coping: Goal: Level of anxiety will decrease Outcome: Progressing   Problem: Elimination: Goal: Will not experience complications related to bowel motility Outcome: Progressing Goal: Will not experience complications related to urinary retention Outcome: Progressing   Problem: Pain Managment: Goal: General experience of comfort will improve and/or be controlled Outcome: Progressing   Problem: Safety: Goal: Ability to remain free from injury will improve Outcome: Progressing   Problem: Skin Integrity: Goal: Risk for impaired skin integrity will decrease Outcome: Progressing

## 2024-06-10 NOTE — Progress Notes (Signed)
 Signed     Expand All Collapse All          Physical Medicine and Rehabilitation Consult Reason for Consult:left sided weakness after fall Referring Physician: Jerri     HPI: Kyle [name] is a 89 y.o. male with a hx of Mitral valve prolapse, mitral regurg, s/p repair in 2007 complicated by post-op a fib who was admitted after a fall on 9/25 with associated confusion and left facial weakness. He was found to be in atrial fibrillation. CTH revealed early/subacute right basal ganglia infarct. CTA head and neck without LVO or significant stenosis. MRI revealed acute infarct in the right basal ganglia with associated petechial hemorrhage, small acute infarct in the parietal lobe. Pt was on asa PTA was changed to eliquis per neurology recs for embolic stroke. + left distal clavicle fx, consvt mgt recommended, sling. Pt was indpendent and driving prior to this admission. I actually took care of his wife for several years and know him.  He lives in one level home with ramped entrance. With therapy he has been min assist for sit-std transfers  and walked 75' min HH assist. He has been min to max assist with ADL's.        Home: Home Living Family/patient expects to be discharged to:: Private residence Living Arrangements: Alone Available Help at Discharge: Family, Friend(s), Available PRN/intermittently, Available 24 hours/day Type of Home: House Home Access: Ramped entrance Home Layout: One level Bathroom Shower/Tub: Psychologist, counselling, Engineer, manufacturing systems: Standard Home Equipment: None Additional Comments: Family reports they can coordinate 24/7 care for pt post discharge temporarily.  Lives With: Alone (wife died 2 years ago)  Functional History: Prior Function Prior Level of Function : Independent/Modified Independent, History of Falls (last six months), Driving Mobility Comments: Ind without an AD ADLs Comments: Ind with ADLs and IADLs; drives; enjoys doing yard work Functional  Status:  Mobility: Bed Mobility Overal bed mobility: Needs Assistance Bed Mobility: Supine to Sit, Sit to Supine Supine to sit: Min assist, +2 for physical assistance, +2 for safety/equipment Sit to supine: Min assist, +2 for physical assistance, +2 for safety/equipment General bed mobility comments: Assist to manage truck/support L shoulder; cue for hand placement/technique and sequencing Transfers Overall transfer level: Needs assistance Equipment used: 1 person hand held assist Transfers: Sit to/from Stand Sit to Stand: Min assist General transfer comment: Pt declined functional transfers this session with no reason given. Pt requiring Min assist for transfers with PT on 9/27. Ambulation/Gait Ambulation/Gait assistance: Min assist Gait Distance (Feet): 75 Feet Assistive device: 1 person hand held assist Gait Pattern/deviations: Ataxic, Narrow base of support, Decreased stride length, Step-through pattern General Gait Details: Pt noted with very narrow ataxic gait pattern. no LOB noted. Gait velocity: decreased   ADL: ADL Overall ADL's : Needs assistance/impaired Eating/Feeding: Set up, Sitting Grooming: Minimal assistance, Sitting, Cueing for compensatory techniques Upper Body Bathing: Moderate assistance, Maximal assistance, Cueing for sequencing, Cueing for compensatory techniques Lower Body Bathing: Maximal assistance, Cueing for compensatory techniques, Sitting/lateral leans, Sit to/from stand, Cueing for sequencing, Cueing for safety Upper Body Dressing : Minimal assistance, Moderate assistance, Sitting, Cueing for sequencing, Cueing for compensatory techniques Lower Body Dressing: Moderate assistance, Maximal assistance, Sitting/lateral leans, Sit to/from stand, Cueing for compensatory techniques, Cueing for safety   Cognition: Cognition Overall Cognitive Status: Impaired/Different from baseline Orientation Level: Oriented to person, Oriented to place, Oriented to  time Cognition Arousal: Alert Behavior During Therapy: [facility] for tasks assessed/performed Overall Cognitive Status: Impaired/Different from baseline  Review of Systems  Unable to perform ROS: Mental acuity       Past Medical History:  Diagnosis Date   Atrial fibrillation [facility])      Post operative, maintaining normal sinus rhythm on amiodarone   Benign prostatic hypertrophy     CAD (coronary artery disease)      Non obstructive by catheterization Feb 2007 prior to his surgery revealing a 40-50% LAD lesion, good LV function with EF 55-60% by last echocardiogram prior to his surgery   Hyperlipidemia      Treated   Mitral regurgitation      S/P mitral valve repair Oct 17, 2005 by Dr. Dusty   Skin cancer               Past Surgical History:  Procedure Laterality Date   CARDIAC CATHETERIZATION   10/2005   COLONOSCOPY       MITRAL VALVE REPAIR   10/17/2005    Dr. Dusty   POLYPECTOMY       SKIN CANCER EXCISION Left      cheek             Family History  Problem Relation Age of Onset   COPD Father     Cancer Brother          melanoma   Heart disease Neg Hx     Colon cancer Neg Hx          Social History:  reports that he has quit smoking. His smoking use included cigarettes. He has never used smokeless tobacco. He reports that he does not drink alcohol and does not use drugs. Allergies:  Allergies       Allergies  Allergen Reactions   Penicillins        REACTION: dizziness/syncope            Medications Prior to Admission  Medication Sig Dispense Refill   amLODipine  (NORVASC ) 5 MG tablet Take 1 tablet (5 mg total) by mouth daily. 90 tablet 3   aspirin  81 MG tablet Take 81 mg by mouth daily.         atorvastatin  (LIPITOR) 20 MG tablet Take 1 tablet (20 mg total) by mouth daily. 90 tablet 3   fish oil-omega-3 fatty acids  1000 MG capsule Take 1 capsule by mouth 2 (two) times daily.       Glucosamine-Chondroitin (OSTEO BI-FLEX REGULAR STRENGTH PO) Take 1 tablet by  mouth in the morning and at bedtime.       Aspirin -Acetaminophen -Caffeine (GOODYS EXTRA STRENGTH PO) Take by mouth as needed. Arthritis                Blood pressure 121/84, pulse 77, temperature 97.7 F (36.5 C), temperature source Oral, resp. rate (!) 24, height 5' 7 (1.702 m), weight 70.3 kg, SpO2 94%. Physical Exam Constitutional:      General: He is not in acute distress. HENT:     Head: Normocephalic and atraumatic.     Nose: Nose normal.     Mouth/Throat:     Mouth: Mucous membranes are moist.  Eyes:     Conjunctiva/sclera: Conjunctivae normal.  Cardiovascular:     Rate and Rhythm: Normal rate.  Pulmonary:     Effort: Pulmonary effort is normal.  Abdominal:     Palpations: Abdomen is soft.  Musculoskeletal:        General: Swelling, tenderness (left shoulder, in sling) and deformity present.     Cervical back: Normal range of motion.  Skin:  General: Skin is warm.     Findings: Bruising (left shoulder/clavlicular area) present.  Neurological:     Mental Status: He is alert.     Comments: Pt is alert and oriented to person, self, hospital, knows he had a stroke. Refers to his wife still being alive at times during conversation. Able to follow basic commands. Speech and language normal. Mild left central VII. LUE difficult to assess d/t fx and pain. Grip and rist at least 3+ to 4/5. LLE 3+ HF, 4- KE and 4/5 ADF/PF. Moves RUE and RLE freely and 4 to 4+/5. Sensory exam normal for light touch and pain in all 4 limbs. No limb ataxia or cerebellar signs. No abnormal tone appreciated.    Psychiatric:     Comments: Pleasant and cooperative. Delusional at times about wife       Lab Results Last 24 Hours  No results found for this or any previous visit (from the past 24 hours).   Imaging Results (Last 48 hours)  No results found.     Assessment/Plan: Diagnosis: 89 yo male with large left basal ganglia infarct, likely embolic d/t atrial fibrillation. Left distal clavicle  fx.  Does the need for close, 24 hr/day medical supervision in concert with the patient's rehab needs make it unreasonable for this patient to be served in a less intensive setting? Yes Co-Morbidities requiring supervision/potential complications:  -HTN -MVR/MG Due to bladder management, bowel management, safety, skin/wound care, disease management, medication administration, pain management, and patient education, does the patient require 24 hr/day rehab nursing? Yes Does the patient require coordinated care of a physician, rehab nurse, therapy disciplines of PT, OT, and likely SLP to address physical and functional deficits in the context of the above medical diagnosis(es)? Yes Addressing deficits in the following areas: balance, endurance, locomotion, strength, transferring, bowel/bladder control, bathing, dressing, feeding, grooming, toileting, cognition, and psychosocial support Can the patient actively participate in an intensive therapy program of at least 3 hrs of therapy per day at least 5 days per week? Yes The potential for patient to make measurable gains while on inpatient rehab is excellent Anticipated functional outcomes upon discharge from inpatient rehab are supervision  with PT, supervision and min assist with OT, modified independent with SLP. Estimated rehab length of stay to reach the above functional goals is: 10-14 days Anticipated discharge destination: Home Overall Rehab/Functional Prognosis: excellent   POST ACUTE RECOMMENDATIONS: This patient's condition is appropriate for continued rehabilitative care in the following setting: CIR Patient has agreed to participate in recommended program. Potentially Note that insurance prior authorization may be required for reimbursement for recommended care.   Comment: Need to confirm family's ability to provide assist at home. Rehab Admissions Coordinator to follow up.       I have personally performed a face to face diagnostic  evaluation of this patient. Additionally, I have examined the patient's medical record including any pertinent labs and radiographic images.     Thanks,   Arthea ONEIDA Gunther, MD 06/09/2024

## 2024-06-10 NOTE — H&P (Signed)
 Physical Medicine and Rehabilitation Admission H&P    Chief Complaint  Patient presents with   Functional decline due to stroke and left clavicle Fx    HPI:  Kyle [name] is an 89 year old male with history of BPH, non-obst CAD s/p MVR, colon polyps, post op A fib who was admitted on 06/05/24 after found down with mental status changes, left facial droop and left shoulder pain. Family had seen patient few hours earlier. WBC elevated at 13.4 and Sepsis work up w/respiratory panel negative. He was found to have left transverse non-displaced distal clavicle Fx and acute infarct in right basal ganglia with associated petechial hemorrhage as well as small acute embolic infarcts in right parietal cortex. CTA negative for LVO.  2 D echo showed EF 60-65% with nor wall abnormality and annuloplasty ring in mitral position. He was noted to be in A fib at admission and stroke felt to be embolic in setting of A fib.   AC held due to petechial hemorrhage. Sling ordered for clavicle Fx and made NWB LUE.  As neurologically stable, ASA changed to Eliquis on 09/28 and Lipitor increased to 40 mg due to LDL-127. He continues to have mild left sided weakness with facial droop and right gaze preference.  PT/OT consulted and patient noted to have mild confusion with difficulty following multi-step commands, has ataxic gait and requires min assist with ADLs and mobility. He was independent and driving PTA. CIR recommended due to functional decline.    Review of Systems  Constitutional: Negative.   HENT:  Positive for hearing loss. Negative for congestion.   Eyes: Negative.   Respiratory:  Positive for cough. Negative for sputum production and shortness of breath.   Cardiovascular:  Negative for chest pain and leg swelling.  Gastrointestinal:  Negative for constipation, diarrhea, nausea and vomiting.  Genitourinary:  Negative for dysuria, frequency and urgency.  Musculoskeletal:  Positive for joint pain.       L  shoulder pain  Skin: Negative.   Neurological:  Positive for sensory change, focal weakness and weakness.  Endo/Heme/Allergies: Negative.   Psychiatric/Behavioral:         Pt reports talks a lot- and sounds anxious?  All other systems reviewed and are negative.    Past Medical History:  Diagnosis Date   Atrial fibrillation [facility])    Post operative, maintaining normal sinus rhythm on amiodarone   Benign prostatic hypertrophy    CAD (coronary artery disease)    Non obstructive by catheterization Feb 2007 prior to his surgery revealing a 40-50% LAD lesion, good LV function with EF 55-60% by last echocardiogram prior to his surgery   Hyperlipidemia    Treated   Mitral regurgitation    S/P mitral valve repair Oct 17, 2005 by Dr. Dusty   Skin cancer     Past Surgical History:  Procedure Laterality Date   CARDIAC CATHETERIZATION  10/2005   COLONOSCOPY     MITRAL VALVE REPAIR  10/17/2005   Dr. Dusty   POLYPECTOMY     SKIN CANCER EXCISION Left    cheek    Family History  Problem Relation Age of Onset   COPD Father    Cancer Brother        melanoma   Heart disease Neg Hx    Colon cancer Neg Hx     Social History:  Widowed 2 years ago. Independent without AD.  reports that he has quit smoking. His smoking use included cigarettes. He  has never used smokeless tobacco. He reports that he does not drink alcohol and does not use drugs.    Allergies  Allergen Reactions   Penicillins     REACTION: dizziness/syncope    Medications Prior to Admission  Medication Sig Dispense Refill   amLODipine  (NORVASC ) 5 MG tablet Take 1 tablet (5 mg total) by mouth daily. 90 tablet 3   aspirin  81 MG tablet Take 81 mg by mouth daily.       atorvastatin  (LIPITOR) 20 MG tablet Take 1 tablet (20 mg total) by mouth daily. 90 tablet 3   fish oil-omega-3 fatty acids  1000 MG capsule Take 1 capsule by mouth 2 (two) times daily.     Glucosamine-Chondroitin (OSTEO BI-FLEX REGULAR STRENGTH PO) Take 1 tablet  by mouth in the morning and at bedtime.     Aspirin -Acetaminophen -Caffeine (GOODYS EXTRA STRENGTH PO) Take by mouth as needed. Arthritis      Home: Home Living Family/patient expects to be discharged to:: Private residence Living Arrangements: Alone Available Help at Discharge: Family, Available 24 hours/day Type of Home: House Home Access: Stairs to enter [employer] of Steps: 1 Entrance Stairs-Rails: None Home Layout: One level Bathroom Shower/Tub: Psychologist, counselling, Engineer, manufacturing systems: Standard Bathroom Accessibility: Yes Home Equipment: None Additional Comments: Family reports they can coordinate 24/7 care for pt post discharge temporarily.  Lives With: Alone (wife died 2 years ago)   Functional History: Prior Function Prior Level of Function : Independent/Modified Independent, History of Falls (last six months), Driving Mobility Comments: Ind without an AD ADLs Comments: Ind with ADLs and IADLs; drives; enjoys doing yard work  Functional Status:  Mobility: Bed Mobility Overal bed mobility: Needs Assistance Bed Mobility: Supine to Sit, Sit to Supine Supine to sit: Min assist, +2 for physical assistance, +2 for safety/equipment Sit to supine: Min assist, +2 for physical assistance, +2 for safety/equipment General bed mobility comments: pt received sitting up in chair, returned to chair Transfers Overall transfer level: Needs assistance Equipment used: 1 person hand held assist Transfers: Sit to/from Stand Sit to Stand: Min assist General transfer comment: minA to power up as pt unable to use L UE functionally at this time Ambulation/Gait Ambulation/Gait assistance: Min assist Gait Distance (Feet): 150 Feet Assistive device: 1 person hand held assist Gait Pattern/deviations: Ataxic, Narrow base of support, Decreased stride length, Step-through pattern General Gait Details: Pt with improved width of base of support and consistent step height and  length compared to saturday. Pt with progressive R UE dependency with increased ambulation distance. Gait velocity: decreased    ADL: ADL Overall ADL's : Needs assistance/impaired Eating/Feeding: Set up, Sitting Grooming: Minimal assistance, Sitting, Cueing for compensatory techniques Upper Body Bathing: Moderate assistance, Maximal assistance, Cueing for sequencing, Cueing for compensatory techniques Lower Body Bathing: Maximal assistance, Cueing for compensatory techniques, Sitting/lateral leans, Sit to/from stand, Cueing for sequencing, Cueing for safety Upper Body Dressing : Minimal assistance, Moderate assistance, Sitting, Cueing for sequencing, Cueing for compensatory techniques Lower Body Dressing: Moderate assistance, Maximal assistance, Sitting/lateral leans, Sit to/from stand, Cueing for compensatory techniques, Cueing for safety  Cognition: Cognition Overall Cognitive Status: Impaired/Different from baseline Orientation Level: Oriented to situation Cognition Arousal: Alert Behavior During Therapy: [facility] for tasks assessed/performed Overall Cognitive Status: Impaired/Different from baseline   Blood pressure 125/79, pulse 74, temperature 98.6 F (37 C), temperature source Oral, resp. rate 19, height 5' 7 (1.702 m), weight 70.3 kg, SpO2 99%. Physical Exam Vitals and nursing note reviewed.  Constitutional:  Appearance: Normal appearance. He is normal weight.     Comments: Pt sitting up in bed; family at bedside; extremely verbose, awake, alert, appropriate, NAD  HENT:     Head: Normocephalic and atraumatic.     Comments:  Mild L facial droop    Right Ear: External ear normal.     Left Ear: External ear normal.     Nose: Nose normal. No congestion.     Mouth/Throat:     Mouth: Mucous membranes are dry.     Pharynx: Oropharynx is clear. No oropharyngeal exudate.  Eyes:     General:        Right eye: No discharge.        Left eye: No discharge.     Extraocular  Movements: Extraocular movements intact.  Cardiovascular:     Rate and Rhythm: Normal rate. Rhythm irregular.     Heart sounds: Normal heart sounds. No murmur heard.    No gallop.  Pulmonary:     Breath sounds: Normal breath sounds.     Comments: Intermittent cough- doesn't sound chest-y CTA B/L- no W/R/R Abdominal:     General: Bowel sounds are normal. There is no distension.     Palpations: Abdomen is soft.     Tenderness: There is no abdominal tenderness.  Musculoskeletal:     Cervical back: Neck supple.     Comments: LUE- sling for UE due to humeral fx WE 4+/5; Grip 4+/5; and FA 4+/5 RUE- 5/5 RLE 5/5 LLE- 4/5 except distally is 4+/5  Skin:    General: Skin is warm and dry.     Comments: R distal triceps area appears swollen- mild erythema- looks infiltrated, but maybe slightly warmer Had 2 IV's= asked to pull one/upper Other IV on R forearm looks OK  Neurological:     Mental Status: He is alert.     Comments: No searching pattern with finger to nose Intact to light touch in UE and LE's  No increased tone Naming intact Ox3- know exact day/date, etc  Psychiatric:        Mood and Affect: Mood normal.        Behavior: Behavior normal.     Comments: Extremely verbose     No results found for this or any previous visit (from the past 48 hours). No results found.    Blood pressure 125/79, pulse 74, temperature 98.6 F (37 C), temperature source Oral, resp. rate 19, height 5' 7 (1.702 m), weight 70.3 kg, SpO2 99%.  Medical Problem List and Plan: 1. Functional deficits secondary to R Basal ganglia infarct and R parietal cortex  -patient may  shower  -ELOS/Goals: supervison to min A- 10-14 days  Admit to CIR 2.  Antithrombotics: -DVT/anticoagulation:  Pharmaceutical: Eliquis  -antiplatelet therapy: N/A 3. Pain Management: Tylenol  prn  4. Mood/Behavior/Sleep: LCSW to follow for evaluation and support.   --continue Melatonin at bedtime.   -antipsychotic agents:  N/A 5. Neuropsych/cognition: This patient is capable of making decisions on his own behalf. 6. Skin/Wound Care: Routine pressure relief measures.  7. Fluids/Electrolytes/Nutrition: Monitor I/O. Check CMET in am.  8.  A fib rate controlled: Monitor HR TID. Now on Eliquis.  9. Thrombocytopenia: Platelets down to 102.  --Recheck in ma. 10. Abnormal LFTs: AST now WNL but TB 108-->1.3  --Recheck in am 11.  Mildly comminuted distal left clavicle Fx:  Sling prn for comfort.  12. HTN: Monitor BP TID--off amlodipine  currently.   --Avoid hypotension.   Dyslipidemia: LDL- 127.  Now on Lipitor 40 mg daily.  13.  Impaired fasting glucose: Hgb A1C- 5.4.      Sharlet GORMAN Schmitz, PA-C 06/10/2024   I have personally performed a face to face diagnostic evaluation of this patient and formulated the key components of the plan.  Additionally, I have personally reviewed laboratory data, imaging studies, as well as relevant notes and concur with the physician assistant's documentation above.   The patient's status has not changed from the original H&P.  Any changes in documentation from the acute care chart have been noted above.

## 2024-06-10 NOTE — Progress Notes (Signed)
 Inpatient Rehabilitation Admission Medication Review by a Pharmacist  A complete drug regimen review was completed for this patient to identify any potential clinically significant medication issues.  High Risk Drug Classes Is patient taking? Indication by Medication  Antipsychotic Yes, as an intravenous medication PRN Prochlorperazine (PO, PR or IV) - nausea  Anticoagulant Yes Apixaban - atrial fibrillation and CVA prophylaxis  Antibiotic No   Opioid No   Antiplatelet No   Hypoglycemics/insulin No   Vasoactive Medication No   Chemotherapy No   Other No Atrovastatin, omega 3 - hyperlipidemia Famotidine - GI prophylaxis Melatonin - sleep  PRNs: Acetaminophen  - mild pain Maalox - indigestion Diphenhydramine - itching Guaifenesin -dextromethorphan  - cough Trazodone - sleep Bisacodyl PR, Fleets enema - constipation     Type of Medication Issue Identified Description of Issue Recommendation(s)  Drug Interaction(s) (clinically significant)     Duplicate Therapy     Allergy     No Medication Administration End Date     Incorrect Dose     Additional Drug Therapy Needed     Significant med changes from prior encounter (inform family/care partners about these prior to discharge). New Apixaban, Famotidine, Melatonin.  Atorvastatin  dose increased.  Aspirin  81 mg discontinued with start of Apixaban.  Holding Amlodipine . Communicate changes with patient/family prior to discharge.   Would also stay off PRN Goody Powders for arthritisi.  To monitor blood pressures for any need to resume.  Other  Also off Osteo Bi-Flex. Resume after discharge.    Clinically significant medication issues were identified that warrant physician communication and completion of prescribed/recommended actions by midnight of the next day:  No  Pharmacist comments:  - would stay off PRN Goody Powders while on anticoagulation  Time spent performing this drug regimen review (minutes):  20   Genaro Zebedee Calin, [place] 06/10/2024 5:26 PM

## 2024-06-10 NOTE — TOC Transition Note (Signed)
 Transition of Care [facility]) - Discharge Note   Patient Details  Name: Kyle [name] MRN: [id number] Date of Birth: August 05, 1935  Transition of Care [facility]) CM/SW Contact:  Inocente GORMAN Kindle, LCSW Phone Number: 06/10/2024, 12:06 PM   Clinical Narrative:    Patient discharging to CIR today. No further Inpatient Care Management needs identified.    Final next level of care: IP Rehab Facility Barriers to Discharge: Barriers Resolved   Patient Goals and CMS Choice Patient states their goals for this hospitalization and ongoing recovery are:: Rehab CMS Medicare.gov Compare Post Acute Care list provided to:: Patient Choice offered to / list presented to : Patient [place] ownership interest in [facility].provided to:: Patient    Discharge Placement                    Patient and family notified of of transfer: 06/10/24  Discharge Plan and Services Additional resources added to the After Visit Summary for                                       Social Drivers of Health (SDOH) Interventions SDOH Screenings   Food Insecurity: No Food Insecurity (06/06/2024)  Housing: Low Risk  (06/06/2024)  Transportation Needs: No Transportation Needs (06/06/2024)  Utilities: Not At Risk (06/06/2024)  Alcohol Screen: Low Risk  (09/06/2023)  Depression (PHQ2-9): Low Risk  (12/05/2023)  Financial Resource Strain: Low Risk  (09/06/2023)  Physical Activity: Insufficiently Active (09/06/2023)  Social Connections: Moderately Integrated (06/06/2024)  Stress: No Stress Concern Present (09/06/2023)  Tobacco Use: Medium Risk (06/05/2024)  Health Literacy: Adequate Health Literacy (09/06/2023)     Readmission Risk Interventions     No data to display

## 2024-06-10 NOTE — H&P (Signed)
 Physical Medicine and Rehabilitation Admission H&P        Chief Complaint  Patient presents with   Functional decline due to stroke and left clavicle Fx      HPI:  Kyle [name] is an 89 year old male with history of BPH, non-obst CAD s/p MVR, colon polyps, post op A fib who was admitted on 06/05/24 after found down with mental status changes, left facial droop and left shoulder pain. Family had seen patient few hours earlier. WBC elevated at 13.4 and Sepsis work up w/respiratory panel negative. He was found to have left transverse non-displaced distal clavicle Fx and acute infarct in right basal ganglia with associated petechial hemorrhage as well as small acute embolic infarcts in right parietal cortex. CTA negative for LVO.  2 D echo showed EF 60-65% with nor wall abnormality and annuloplasty ring in mitral position. He was noted to be in A fib at admission and stroke felt to be embolic in setting of A fib.    AC held due to petechial hemorrhage. Sling ordered for clavicle Fx and made NWB LUE.  As neurologically stable, ASA changed to Eliquis on 09/28 and Lipitor increased to 40 mg due to LDL-127. He continues to have mild left sided weakness with facial droop and right gaze preference.  PT/OT consulted and patient noted to have mild confusion with difficulty following multi-step commands, has ataxic gait and requires min assist with ADLs and mobility. He was independent and driving PTA. CIR recommended due to functional decline.      Review of Systems  Constitutional: Negative.   HENT:  Positive for hearing loss. Negative for congestion.   Eyes: Negative.   Respiratory:  Positive for cough. Negative for sputum production and shortness of breath.   Cardiovascular:  Negative for chest pain and leg swelling.  Gastrointestinal:  Negative for constipation, diarrhea, nausea and vomiting.  Genitourinary:  Negative for dysuria, frequency and urgency.  Musculoskeletal:  Positive for joint  pain.       L shoulder pain  Skin: Negative.   Neurological:  Positive for sensory change, focal weakness and weakness.  Endo/Heme/Allergies: Negative.   Psychiatric/Behavioral:         Pt reports talks a lot- and sounds anxious?  All other systems reviewed and are negative.          Past Medical History:  Diagnosis Date   Atrial fibrillation [facility])      Post operative, maintaining normal sinus rhythm on amiodarone   Benign prostatic hypertrophy     CAD (coronary artery disease)      Non obstructive by catheterization Feb 2007 prior to his surgery revealing a 40-50% LAD lesion, good LV function with EF 55-60% by last echocardiogram prior to his surgery   Hyperlipidemia      Treated   Mitral regurgitation      S/P mitral valve repair Oct 17, 2005 by Dr. Dusty   Skin cancer                 Past Surgical History:  Procedure Laterality Date   CARDIAC CATHETERIZATION   10/2005   COLONOSCOPY       MITRAL VALVE REPAIR   10/17/2005    Dr. Dusty   POLYPECTOMY       SKIN CANCER EXCISION Left      cheek               Family History  Problem Relation Age of  Onset   COPD Father     Cancer Brother          melanoma   Heart disease Neg Hx     Colon cancer Neg Hx            Social History:  Widowed 2 years ago. Independent without AD.  reports that he has quit smoking. His smoking use included cigarettes. He has never used smokeless tobacco. He reports that he does not drink alcohol and does not use drugs.     Allergies       Allergies  Allergen Reactions   Penicillins        REACTION: dizziness/syncope              Medications Prior to Admission  Medication Sig Dispense Refill   amLODipine  (NORVASC ) 5 MG tablet Take 1 tablet (5 mg total) by mouth daily. 90 tablet 3   aspirin  81 MG tablet Take 81 mg by mouth daily.         atorvastatin  (LIPITOR) 20 MG tablet Take 1 tablet (20 mg total) by mouth daily. 90 tablet 3   fish oil-omega-3 fatty acids  1000 MG capsule Take  1 capsule by mouth 2 (two) times daily.       Glucosamine-Chondroitin (OSTEO BI-FLEX REGULAR STRENGTH PO) Take 1 tablet by mouth in the morning and at bedtime.       Aspirin -Acetaminophen -Caffeine (GOODYS EXTRA STRENGTH PO) Take by mouth as needed. Arthritis              Home: Home Living Family/patient expects to be discharged to:: Private residence Living Arrangements: Alone Available Help at Discharge: Family, Available 24 hours/day Type of Home: House Home Access: Stairs to enter [employer] of Steps: 1 Entrance Stairs-Rails: None Home Layout: One level Bathroom Shower/Tub: Psychologist, counselling, Engineer, manufacturing systems: Standard Bathroom Accessibility: Yes Home Equipment: None Additional Comments: Family reports they can coordinate 24/7 care for pt post discharge temporarily.  Lives With: Alone (wife died 2 years ago)   Functional History: Prior Function Prior Level of Function : Independent/Modified Independent, History of Falls (last six months), Driving Mobility Comments: Ind without an AD ADLs Comments: Ind with ADLs and IADLs; drives; enjoys doing yard work   Functional Status:  Mobility: Bed Mobility Overal bed mobility: Needs Assistance Bed Mobility: Supine to Sit, Sit to Supine Supine to sit: Min assist, +2 for physical assistance, +2 for safety/equipment Sit to supine: Min assist, +2 for physical assistance, +2 for safety/equipment General bed mobility comments: pt received sitting up in chair, returned to chair Transfers Overall transfer level: Needs assistance Equipment used: 1 person hand held assist Transfers: Sit to/from Stand Sit to Stand: Min assist General transfer comment: minA to power up as pt unable to use L UE functionally at this time Ambulation/Gait Ambulation/Gait assistance: Min assist Gait Distance (Feet): 150 Feet Assistive device: 1 person hand held assist Gait Pattern/deviations: Ataxic, Narrow base of support, Decreased  stride length, Step-through pattern General Gait Details: Pt with improved width of base of support and consistent step height and length compared to saturday. Pt with progressive R UE dependency with increased ambulation distance. Gait velocity: decreased   ADL: ADL Overall ADL's : Needs assistance/impaired Eating/Feeding: Set up, Sitting Grooming: Minimal assistance, Sitting, Cueing for compensatory techniques Upper Body Bathing: Moderate assistance, Maximal assistance, Cueing for sequencing, Cueing for compensatory techniques Lower Body Bathing: Maximal assistance, Cueing for compensatory techniques, Sitting/lateral leans, Sit to/from stand, Cueing for sequencing, Cueing for safety Upper  Body Dressing : Minimal assistance, Moderate assistance, Sitting, Cueing for sequencing, Cueing for compensatory techniques Lower Body Dressing: Moderate assistance, Maximal assistance, Sitting/lateral leans, Sit to/from stand, Cueing for compensatory techniques, Cueing for safety   Cognition: Cognition Overall Cognitive Status: Impaired/Different from baseline Orientation Level: Oriented to situation Cognition Arousal: Alert Behavior During Therapy: [facility] for tasks assessed/performed Overall Cognitive Status: Impaired/Different from baseline     Blood pressure 125/79, pulse 74, temperature 98.6 F (37 C), temperature source Oral, resp. rate 19, height 5' 7 (1.702 m), weight 70.3 kg, SpO2 99%. Physical Exam Vitals and nursing note reviewed.  Constitutional:      Appearance: Normal appearance. He is normal weight.     Comments: Pt sitting up in bed; family at bedside; extremely verbose, awake, alert, appropriate, NAD  HENT:     Head: Normocephalic and atraumatic.     Comments:  Mild L facial droop    Right Ear: External ear normal.     Left Ear: External ear normal.     Nose: Nose normal. No congestion.     Mouth/Throat:     Mouth: Mucous membranes are dry.     Pharynx: Oropharynx is clear. No  oropharyngeal exudate.  Eyes:     General:        Right eye: No discharge.        Left eye: No discharge.     Extraocular Movements: Extraocular movements intact.  Cardiovascular:     Rate and Rhythm: Normal rate. Rhythm irregular.     Heart sounds: Normal heart sounds. No murmur heard.    No gallop.  Pulmonary:     Breath sounds: Normal breath sounds.     Comments: Intermittent cough- doesn't sound chest-y CTA B/L- no W/R/R Abdominal:     General: Bowel sounds are normal. There is no distension.     Palpations: Abdomen is soft.     Tenderness: There is no abdominal tenderness.  Musculoskeletal:     Cervical back: Neck supple.     Comments: LUE- sling for UE due to humeral fx WE 4+/5; Grip 4+/5; and FA 4+/5 RUE- 5/5 RLE 5/5 LLE- 4/5 except distally is 4+/5  Skin:    General: Skin is warm and dry.     Comments: R distal triceps area appears swollen- mild erythema- looks infiltrated, but maybe slightly warmer Had 2 IV's= asked to pull one/upper Other IV on R forearm looks OK  Neurological:     Mental Status: He is alert.     Comments: No searching pattern with finger to nose Intact to light touch in UE and LE's  No increased tone Naming intact Ox3- know exact day/date, etc  Psychiatric:        Mood and Affect: Mood normal.        Behavior: Behavior normal.     Comments: Extremely verbose       Lab Results Last 48 Hours  No results found for this or any previous visit (from the past 48 hours).   Imaging Results (Last 48 hours)  No results found.         Blood pressure 125/79, pulse 74, temperature 98.6 F (37 C), temperature source Oral, resp. rate 19, height 5' 7 (1.702 m), weight 70.3 kg, SpO2 99%.   Medical Problem List and Plan: 1. Functional deficits secondary to R Basal ganglia infarct and R parietal cortex             -patient may  shower             -  ELOS/Goals: supervison to min A- 10-14 days             Admit to CIR 2.   Antithrombotics: -DVT/anticoagulation:  Pharmaceutical: Eliquis             -antiplatelet therapy: N/A 3. Pain Management: Tylenol  prn  4. Mood/Behavior/Sleep: LCSW to follow for evaluation and support.              --continue Melatonin at bedtime.              -antipsychotic agents: N/A 5. Neuropsych/cognition: This patient is capable of making decisions on his own behalf. 6. Skin/Wound Care: Routine pressure relief measures.  7. Fluids/Electrolytes/Nutrition: Monitor I/O. Check CMET in am.  8.  A fib rate controlled: Monitor HR TID. Now on Eliquis.  9. Thrombocytopenia: Platelets down to 102.  --Recheck in ma. 10. Abnormal LFTs: AST now WNL but TB 108-->1.3  --Recheck in am 11.  Mildly comminuted distal left clavicle Fx:  Sling prn for comfort.  12. HTN: Monitor BP TID--off amlodipine  currently.              --Avoid hypotension.   Dyslipidemia: LDL- 127. Now on Lipitor 40 mg daily.  13.  Impaired fasting glucose: Hgb A1C- 5.4.          Sharlet GORMAN Schmitz, PA-C 06/10/2024     I have personally performed a face to face diagnostic evaluation of this patient and formulated the key components of the plan.  Additionally, I have personally reviewed laboratory data, imaging studies, as well as relevant notes and concur with the physician assistant's documentation above.   The patient's status has not changed from the original H&P.  Any changes in documentation from the acute care chart have been noted above.

## 2024-06-10 NOTE — Progress Notes (Signed)
 Gave Report to  [facility] Nurse.

## 2024-06-10 NOTE — Progress Notes (Signed)
 Inpatient Rehab Admissions Coordinator:  Received insurance authorization. There is a bed available in CIR today. Dr. Raenelle aware and in agreement. Pt, pt's daughter Marval, pt's brother, TOC and NSG made aware.   Tinnie Yvone Cohens, MS, CCC-SLP Admissions Coordinator [phone]

## 2024-06-10 NOTE — Progress Notes (Signed)
 PROGRESS NOTE        PATIENT DETAILS Name: Kyle [name] Age: 89 y.o. Sex: male Date of Birth: 08/02/1935 Admit Date: 06/05/2024 Admitting Physician Editha Ram, MD ERE:Izuupwhzm, Fonda LABOR, MD  Brief Summary: Patient is a 89 y.o.  male with history of mitral valve repair with postoperative A-fib in 2006, CVA in 2007, CAD, HTN, HLD who presented with a fall-subsequent imaging studies demonstrated a left shoulder fracture and a acute CVA.  Significant events: 9/26>> admit to [facility]  Significant studies: 9/25>> x-ray left shoulder: Transverse nondisplaced fracture of the distal left clavicle. 9/25>> CT head: Subacute right basal ganglia infarct. 9/25>> CT chest/abdomen/pelvis: No acute findings. 9/26>> CT angio head/neck: No LVO/aneurysm in head/neck. 9/26>> MRI brain: Acute infarct right basal ganglia with associated petechial hemorrhage, small acute infarcts in the right parietal cortex. 9/26>> echo: EF 60-65% 9/26>> A1c: 5.4 9/26>> LDL: 127  Significant microbiology data: 9/25>> COVID/influenza/RSV PCR: Negative  Procedures: None  Consults: Neurology  Subjective: No complaints-lying comfortably in bed.  Objective: Vitals: Blood pressure 125/79, pulse 74, temperature 98.6 F (37 C), temperature source Oral, resp. rate 19, height 5' 7 (1.702 m), weight 70.3 kg, SpO2 99%.   Exam: Gen Exam:Alert awake-not in any distress HEENT:atraumatic, normocephalic Chest: B/L clear to auscultation anteriorly CVS:S1S2 regular Abdomen:soft non tender, non distended Extremities:no edema Neurology: Non focal Skin: no rash  Pertinent Labs/Radiology:    Latest Ref Rng & Units 06/06/2024    9:07 AM 06/05/2024    7:00 PM 12/05/2023    8:27 AM  CBC  WBC 4.0 - 10.5 K/uL 9.1  13.4  7.2   Hemoglobin 13.0 - 17.0 g/dL 86.5  85.4  86.2   Hematocrit 39.0 - 52.0 % 39.6  42.9  40.9   Platelets 150 - 400 K/uL 102  272  257     Lab Results  Component Value Date    NA 138 06/06/2024   K 3.6 06/06/2024   CL 103 06/06/2024   CO2 19 (L) 06/06/2024      Assessment/Plan: Acute CVA with a small potential hemorrhage in basal ganglia Embolic-in the setting of New onset A-fib Workup as above Exam stable-unchanged Eliquis started 9/28-seems to be tolerating well Awaiting insurance authorization to go to CIR.  PAF Remains in A-fib Rate controlled Telemetry monitoring Now on Eliquis.  Left distal clavicle fracture Sling/immobilizer in place No pain Outpatient orthopedic follow-up  HLD Lipitor dosage increased to 40 mg-this will be continued on discharge Continue Lovaza   History of mitral valve repair Valve appears stable on echo Outpatient cardiology follow-up.  HTN BP stable-amlodipine  on hold  Hospital delirium Appears mild Supportive care Add melatonin nightly Delirium precautions.  Code status:   Code Status: Limited: Do not attempt resuscitation (DNR) -DNR-LIMITED -Do Not Intubate/DNI    DVT Prophylaxis: SCD's Start: 06/06/24 0724-hold off on pharmacological prophylaxis due to petechial hemorrhage in the basal ganglia. apixaban (ELIQUIS) tablet 5 mg    Family Communication: Daughter-[name] Lynnea [phone] called 9/28   Disposition Plan: Status is: Inpatient Remains inpatient appropriate because: Severity of illness   Planned Discharge Destination:CIR   Diet: Diet Order             Diet Heart Room service appropriate? Yes; Fluid consistency: Thin  Diet effective now  Antimicrobial agents: Anti-infectives (From admission, onward)    Start     Dose/Rate Route Frequency Ordered Stop   06/05/24 1945  cefTRIAXone  (ROCEPHIN ) 1 g in sodium chloride  0.9 % 100 mL IVPB        1 g 200 mL/hr over 30 Minutes Intravenous  Once 06/05/24 1941 06/05/24 2248   06/05/24 1945  azithromycin  (ZITHROMAX ) 500 mg in sodium chloride  0.9 % 250 mL IVPB        500 mg 250 mL/hr over 60 Minutes Intravenous   Once 06/05/24 1941 06/06/24 0257        MEDICATIONS: Scheduled Meds:  apixaban  5 mg Oral BID   atorvastatin   40 mg Oral Daily   famotidine  20 mg Oral Daily   melatonin  5 mg Oral QHS   omega-3 acid ethyl esters  1 g Oral BID   Continuous Infusions: PRN Meds:.acetaminophen  **OR** acetaminophen  (TYLENOL ) oral liquid 160 mg/5 mL **OR** acetaminophen , guaiFENesin -dextromethorphan , haloperidol lactate   I have personally reviewed following labs and imaging studies  LABORATORY DATA: CBC: Recent Labs  Lab 06/05/24 1900 06/06/24 0907  WBC 13.4* 9.1  NEUTROABS 9.9*  --   HGB 14.5 13.4  HCT 42.9 39.6  MCV 89.7 91.9  PLT 272 102*    Basic Metabolic Panel: Recent Labs  Lab 06/05/24 1900 06/06/24 0907  NA 137 138  K 3.9 3.6  CL 99 103  CO2 24 19*  GLUCOSE 112* 97  BUN 13 8  CREATININE 0.76 0.65  CALCIUM  9.4 8.6*    GFR: Estimated Creatinine Clearance: 60.8 mL/min (by C-G formula based on SCr of 0.65 mg/dL).  Liver Function Tests: Recent Labs  Lab 06/05/24 1900 06/06/24 0907  AST 53* 34  ALT 23 22  ALKPHOS 45 40  BILITOT 1.8* 1.3*  PROT 7.2 6.3*  ALBUMIN 4.2 3.5   No results for input(s): LIPASE, AMYLASE in the last 168 hours. No results for input(s): AMMONIA in the last 168 hours.  Coagulation Profile: No results for input(s): INR, PROTIME in the last 168 hours.  Cardiac Enzymes: No results for input(s): CKTOTAL, CKMB, CKMBINDEX, TROPONINI in the last 168 hours.  BNP (last 3 results) No results for input(s): PROBNP in the last 8760 hours.  Lipid Profile: No results for input(s): CHOL, HDL, LDLCALC, TRIG, CHOLHDL, LDLDIRECT in the last 72 hours.   Thyroid Function Tests: No results for input(s): TSH, T4TOTAL, FREET4, T3FREE, THYROIDAB in the last 72 hours.  Anemia Panel: No results for input(s): VITAMINB12, FOLATE, FERRITIN, TIBC, IRON, RETICCTPCT in the last 72 hours.  Urine analysis:     Component Value Date/Time   COLORURINE YELLOW 06/05/2024 1819   APPEARANCEUR CLEAR 06/05/2024 1819   LABSPEC 1.021 06/05/2024 1819   PHURINE 5.0 06/05/2024 1819   GLUCOSEU NEGATIVE 06/05/2024 1819   HGBUR NEGATIVE 06/05/2024 1819   BILIRUBINUR NEGATIVE 06/05/2024 1819   KETONESUR 20 (A) 06/05/2024 1819   PROTEINUR NEGATIVE 06/05/2024 1819   NITRITE NEGATIVE 06/05/2024 1819   LEUKOCYTESUR TRACE (A) 06/05/2024 1819    Sepsis Labs: Lactic Acid, Venous    Component Value Date/Time   LATICACIDVEN 1.5 06/05/2024 1907    MICROBIOLOGY: Recent Results (from the past 240 hours)  Culture, blood (routine x 2)     Status: None   Collection Time: 06/05/24  7:00 PM   Specimen: BLOOD  Result Value Ref Range Status   Specimen Description BLOOD LEFT ANTECUBITAL  Final   Special Requests   Final    BOTTLES DRAWN AEROBIC  ONLY Blood Culture adequate volume   Culture   Final    NO GROWTH 5 DAYS Performed at [facility], [address], [place], [place] [address]    Report Status 06/10/2024 FINAL  Final  Resp panel by RT-PCR (RSV, Flu A&B, Covid) Anterior Nasal Swab     Status: None   Collection Time: 06/05/24  7:43 PM   Specimen: Anterior Nasal Swab  Result Value Ref Range Status   SARS Coronavirus 2 by RT PCR NEGATIVE NEGATIVE Final   Influenza A by PCR NEGATIVE NEGATIVE Final   Influenza B by PCR NEGATIVE NEGATIVE Final    Comment: (NOTE) The Xpert Xpress SARS-CoV-2/FLU/RSV plus assay is intended as an aid in the diagnosis of influenza from Nasopharyngeal swab specimens and should not be used as a sole basis for treatment. Nasal washings and aspirates are unacceptable for Xpert Xpress SARS-CoV-2/FLU/RSV testing.  Fact Sheet for Patients: BloggerCourse.com  Fact Sheet for Healthcare Providers: SeriousBroker.it  This test is not yet approved or cleared by the [place]  FDA and has been authorized for detection and/or  diagnosis of SARS-CoV-2 by FDA under an Emergency Use Authorization (EUA). This EUA will remain in effect (meaning this test can be used) for the duration of the COVID-19 declaration under Section 564(b)(1) of the Act, 21 U.S.C. section 360bbb-3(b)(1), unless the authorization is terminated or revoked.     Resp Syncytial Virus by PCR NEGATIVE NEGATIVE Final    Comment: (NOTE) Fact Sheet for Patients: BloggerCourse.com  Fact Sheet for Healthcare Providers: SeriousBroker.it  This test is not yet approved or cleared by the [place]  FDA and has been authorized for detection and/or diagnosis of SARS-CoV-2 by FDA under an Emergency Use Authorization (EUA). This EUA will remain in effect (meaning this test can be used) for the duration of the COVID-19 declaration under Section 564(b)(1) of the Act, 21 U.S.C. section 360bbb-3(b)(1), unless the authorization is terminated or revoked.  Performed at [facility], [address], [place], [place] [address]   Culture, blood (routine x 2)     Status: None (Preliminary result)   Collection Time: 06/06/24  9:07 AM   Specimen: BLOOD RIGHT HAND  Result Value Ref Range Status   Specimen Description BLOOD RIGHT HAND  Final   Special Requests AEROBIC BOTTLE ONLY Blood Culture adequate volume  Final   Culture   Final    NO GROWTH 4 DAYS Performed at [facility], [address] [place], [place] [address]    Report Status PENDING  Incomplete    RADIOLOGY STUDIES/RESULTS: No results found.    LOS: 4 days   Donalda Applebaum, MD  [facility]    To contact the attending provider between 7A-7P or the covering provider during after hours 7P-7A, please log into the web site [email or web address] and access using universal [place] password for that web site. If you do not have the password, please call the hospital operator.  06/10/2024, 10:00 AM

## 2024-06-10 NOTE — Progress Notes (Signed)
   06/10/24 1001  Mobility  Activity Ambulated with assistance  Level of Assistance Minimal assist, patient does 75% or more  Assistive Device Other (Comment) (HHA)  Distance Ambulated (ft) 300 ft  Activity Response Tolerated fair  Mobility Referral Yes  Mobility visit 1 Mobility  Mobility Specialist Start Time (ACUTE ONLY) 1001  Mobility Specialist Stop Time (ACUTE ONLY) 1015  Mobility Specialist Time Calculation (min) (ACUTE ONLY) 14 min   Mobility Specialist: Progress Note  Pt agreeable to mobility session - received in chair. C/o pain LUE. Returned to chair with all needs met - call bell within reach. Chair alarm on.   Virgle Boards, BS Mobility Specialist Please contact via SecureChat or  Rehab office at [phone].

## 2024-06-10 NOTE — Plan of Care (Signed)

## 2024-06-10 NOTE — Progress Notes (Signed)
 Signed     Expand All Collapse All PMR Admission Coordinator Pre-Admission Assessment   Patient: Kyle [name] is an 89 y.o., male MRN: [id number] DOB: 08-Jan-1935 Height: 5' 7 (170.2 cm) Weight: 70.3 kg   Insurance Information HMO: yes    PPO:      PCP:      IPA:      80/20:      OTHER:  PRIMARY: [employer] Medicare      Policy#: [id number]      Subscriber: patient CM Name: TBD      Phone#: TBD     Fax#: [phone] Pre-Cert#: [id number]  Received insurance authorization via voicemail from [employer] on 06/09/24. Pt approved from 06/09/24-06/16/24. Next review date is 06/16/24.   Employer:  Benefits:  Phone #: [phone]     Name:  Eff. Date: 09/12/23-09/10/24     Deduct: $0 (does not have deductible)      Out of Pocket Max: $3,900 ($60 met)      Life Max: NA CIR: $300/day co-pay for days 1-5, 100% coverage for days 6+      SNF: 100% coverage for days 1-20, $203 co-pay/day for days 21-100 Outpatient: $20 co-pay/visit     Co-Pay:  Home Health: 100% coverage      Co-Pay:  DME: 80% coverage     Co-Pay: 20% co-insurance Providers: in-network SECONDARY:       Policy#:      Phone#:    Artist:       Phone#:    The Data processing manager" for patients in Inpatient Rehabilitation Facilities with attached "Privacy Act Statement-Health Care Records" was provided and verbally reviewed with: Patient and Family   Emergency Contact Information Contact Information       Name Relation Home Work Mobile    [name],[name] Daughter     [phone]    JASAN, DOUGHTIE     [phone]         Other Contacts       Name Relation Home Work Mobile    [name],[name] Son     [phone]           Current Medical History  Patient Admitting Diagnosis: CVA History of Present Illness: Pt is an 89 year old male with medical hx significant for: BPH, CAD, HTN, HLD, mitral valve repair (2006), CVA (2007). Pt presented to [facility] on 06/[facility]24 d/t fall. Pt  reported pain in area below right ribs. Imaging revealed acute/subacute right basal ganglia infarct. Left should x-ray showed nondisplaced fracture of left distal clavicle. Placed in sling immobilizer. Neurology consulted. EKG showed A-fib and right bundle blanch block. CTA negative for LVO. MRI showed acute infarct in right basal ganglia with associated petechial hemorrhage. Also showed small acute embolic infarcts in right parietal cortex. CT C-spine negative for acute findings. CT chest/abdomen/pelvis negative for acute findings. Therapy evaluations completed and CIR recommended d/t pt's deficits in functional mobility.  Complete NIHSS TOTAL: 1   Patient's medical record from [facility] has been reviewed by the rehabilitation admission coordinator and physician.   Past Medical History      Past Medical History:  Diagnosis Date   Atrial fibrillation [facility])      Post operative, maintaining normal sinus rhythm on amiodarone   Benign prostatic hypertrophy     CAD (coronary artery disease)      Non obstructive by catheterization Feb 2007 prior to his surgery revealing a 40-50% LAD lesion, good LV function with EF 55-60% by  last echocardiogram prior to his surgery   Hyperlipidemia      Treated   Mitral regurgitation      S/P mitral valve repair Oct 17, 2005 by Dr. Dusty   Skin cancer            Has the patient had major surgery during 100 days prior to admission? No   Family History   family history includes COPD in his father; Cancer in his brother.   Current Medications  Current Medications    Current Facility-Administered Medications:    acetaminophen  (TYLENOL ) tablet 650 mg, 650 mg, Oral, Q6H PRN, 650 mg at 06/08/24 0101 **OR** acetaminophen  (TYLENOL ) 160 MG/5ML solution 650 mg, 650 mg, Per Tube, Q6H PRN **OR** acetaminophen  (TYLENOL ) suppository 650 mg, 650 mg, Rectal, Q6H PRN, Rathore, Vasundhra, MD   apixaban (ELIQUIS) tablet 5 mg, 5 mg, Oral, BID, Dodson, Andrew J, RPH, 5  mg at 06/10/24 1037   atorvastatin  (LIPITOR) tablet 40 mg, 40 mg, Oral, Daily, Jerri Pfeiffer, MD, 40 mg at 06/10/24 1037   famotidine (PEPCID) tablet 20 mg, 20 mg, Oral, Daily, Ghimire, Shanker M, MD, 20 mg at 06/10/24 1037   guaiFENesin -dextromethorphan  (ROBITUSSIN DM) 100-10 MG/5ML syrup 15 mL, 15 mL, Oral, Q4H PRN, Rathore, Vasundhra, MD, 15 mL at 06/10/24 1050   haloperidol lactate (HALDOL) injection 2 mg, 2 mg, Intravenous, Q6H PRN, Ghimire, Donalda HERO, MD   melatonin tablet 5 mg, 5 mg, Oral, QHS, Ghimire, Shanker M, MD, 5 mg at 06/09/24 1958   omega-3 acid ethyl esters (LOVAZA ) capsule 1 g, 1 g, Oral, BID, Rathore, Vasundhra, MD, 1 g at 06/10/24 1037     Patients Current Diet:  Diet Order                  Diet Heart Room service appropriate? Yes; Fluid consistency: Thin  Diet effective now                         Precautions / Restrictions Precautions Precautions: Fall Restrictions Weight Bearing Restrictions Per Provider Order: No    Has the patient had 2 or more falls or a fall with injury in the past year? Yes   Prior Activity Level Community (5-7x/wk): drives, gets out of house often   Prior Functional Level Self Care: Did the patient need help bathing, dressing, using the toilet or eating? Independent   Indoor Mobility: Did the patient need assistance with walking from room to room (with or without device)? Independent   Stairs: Did the patient need assistance with internal or external stairs (with or without device)? Independent   Functional Cognition: Did the patient need help planning regular tasks such as shopping or remembering to take medications? Independent   Patient Information Are you of Hispanic, Latino/a,or Spanish origin?: A. No, not of Hispanic, Latino/a, or Spanish origin What is your race?: A. White Do you need or want an interpreter to communicate with a doctor or health care staff?: 0. No   Patient's Response To:  Health Literacy and  Transportation Is the patient able to respond to health literacy and transportation needs?: Yes Health Literacy - How often do you need to have someone help you when you read instructions, pamphlets, or other written material from your doctor or pharmacy?: Never In the past 12 months, has lack of transportation kept you from medical appointments or from getting medications?: No In the past 12 months, has lack of transportation kept you from meetings, work, or  from getting things needed for daily living?: No   Home Assistive Devices / Equipment Home Equipment: None   Prior Device Use: Indicate devices/aids used by the patient prior to current illness, exacerbation or injury? None of the above   Current Functional Level Cognition   Overall Cognitive Status: Impaired/Different from baseline Orientation Level: Oriented to situation    Extremity Assessment (includes Sensation/Coordination)   Upper Extremity Assessment: Right hand dominant, LUE deficits/detail (R UE overall WFL) LUE Deficits / Details: L shoulder fx; AROM of elbow, wrist, and hand WFL; decreased coordination; decreased proprioception; shoulder ROM not tested secondary to awaiting Ortho consult; pt with no WB restrictions per MD; pt with L UE sling LUE Sensation: decreased proprioception LUE Coordination: decreased fine motor, decreased gross motor  Lower Extremity Assessment: Defer to PT evaluation     ADLs   Overall ADL's : Needs assistance/impaired Eating/Feeding: Set up, Sitting Grooming: Minimal assistance, Sitting, Cueing for compensatory techniques Upper Body Bathing: Moderate assistance, Maximal assistance, Cueing for sequencing, Cueing for compensatory techniques Lower Body Bathing: Maximal assistance, Cueing for compensatory techniques, Sitting/lateral leans, Sit to/from stand, Cueing for sequencing, Cueing for safety Upper Body Dressing : Minimal assistance, Moderate assistance, Sitting, Cueing for sequencing,  Cueing for compensatory techniques Lower Body Dressing: Moderate assistance, Maximal assistance, Sitting/lateral leans, Sit to/from stand, Cueing for compensatory techniques, Cueing for safety     Mobility   Overal bed mobility: Needs Assistance Bed Mobility: Supine to Sit, Sit to Supine Supine to sit: Min assist, +2 for physical assistance, +2 for safety/equipment Sit to supine: Min assist, +2 for physical assistance, +2 for safety/equipment General bed mobility comments: pt received sitting up in chair, returned to chair     Transfers   Overall transfer level: Needs assistance Equipment used: 1 person hand held assist Transfers: Sit to/from Stand Sit to Stand: Min assist General transfer comment: minA to power up as pt unable to use L UE functionally at this time     Ambulation / Gait / Stairs / Wheelchair Mobility   Ambulation/Gait Ambulation/Gait assistance: Editor, commissioning (Feet): 150 Feet Assistive device: 1 person hand held assist Gait Pattern/deviations: Ataxic, Narrow base of support, Decreased stride length, Step-through pattern General Gait Details: Pt with improved width of base of support and consistent step height and length compared to saturday. Pt with progressive R UE dependency with increased ambulation distance. Gait velocity: decreased     Posture / Balance Balance Overall balance assessment: Needs assistance Sitting-balance support: Single extremity supported, Feet supported Sitting balance-Leahy Scale: Good Standing balance support: Bilateral upper extremity supported, During functional activity Standing balance-Leahy Scale: Poor Standing balance comment: Reliant on HHA     Special considerations/life events  Skin Abrasion: arm, head/left; Ecchymosis: arm, head/bilateral; Scratch Marks: arm/bilateral and Bladder incontinence    Previous Home Environment (from acute therapy documentation) Living Arrangements: Alone  Lives With: Alone (wife died 2  years ago) Available Help at Discharge: Family, Available 24 hours/day Type of Home: House Home Layout: One level Home Access: Stairs to enter Entrance Stairs-Rails: None Entrance Stairs-Number of Steps: 1 Bathroom Shower/Tub: Psychologist, counselling, Engineer, manufacturing systems: Standard Bathroom Accessibility: Yes How Accessible: Accessible via walker Home Care Services: No Additional Comments: Family reports they can coordinate 24/7 care for pt post discharge temporarily.   Discharge Living Setting Plans for Discharge Living Setting: Patient's home Type of Home at Discharge: House Discharge Home Layout: One level Discharge Home Access: Stairs to enter Entrance Stairs-Rails: None Entrance Stairs-Number of Steps:  1 Discharge Bathroom Shower/Tub: Walk-in shower, Tub/shower unit Discharge Bathroom Toilet: Standard Discharge Bathroom Accessibility: Yes How Accessible: Accessible via walker Does the patient have any problems obtaining your medications?: No   Social/Family/Support Systems Anticipated Caregiver: Marval Brazen (daughter) and other family Anticipated Caregiver's Contact Information: [name]: [phone] Caregiver Availability: 24/7 Discharge Plan Discussed with Primary Caregiver: Yes Is Caregiver In Agreement with Plan?: Yes Does Caregiver/Family have Issues with Lodging/Transportation while Pt is in Rehab?: No   Goals Patient/Family Goal for Rehab: Supervision: PT, Supervision-Min A:OT, Mod I: ST Expected length of stay: 10-14 days Pt/Family Agrees to Admission and willing to participate: Yes Program Orientation Provided & Reviewed with Pt/Caregiver Including Roles  & Responsibilities: Yes   Decrease burden of Care through IP rehab admission: NA   Possible need for SNF placement upon discharge: Not anticipated   Patient Condition: This patient's condition remains as documented in the consult dated 06/09/24, in which the Rehabilitation Physician determined and  documented that the patient's condition is appropriate for intensive rehabilitative care in an inpatient rehabilitation facility. Will admit to inpatient rehab today.   Preadmission Screen Completed By:  Tinnie SHAUNNA Yvone Delayne, 06/10/2024 11:53 AM ______________________________________________________________________   Discussed status with Dr. Lovorn on 06/10/24  at 11:53 AM and received approval for admission today.   Admission Coordinator:  Tinnie SHAUNNA Yvone Delayne, CCC-SLP, time 11:53 AM/Date 06/10/24     Assessment/Plan: Diagnosis: R basal gabglia stroke with petechial hemorrhage Does the need for close, 24 hr/day Medical supervision in concert with the patient's rehab needs make it unreasonable for this patient to be served in a less intensive setting? Yes Co-Morbidities requiring supervision/potential complications: Afib, prior CVA, MVR 2006; HTN, HLD; L shoulder/humeral fx NWB LUE, delirium Due to bladder management, bowel management, safety, skin/wound care, disease management, medication administration, pain management, and patient education, does the patient require 24 hr/day rehab nursing? Yes Does the patient require coordinated care of a physician, rehab nurse, PT, OT, and SLP to address physical and functional deficits in the context of the above medical diagnosis(es)? Yes Addressing deficits in the following areas: balance, endurance, locomotion, strength, transferring, bowel/bladder control, bathing, dressing, feeding, grooming, toileting, and cognition Can the patient actively participate in an intensive therapy program of at least 3 hrs of therapy 5 days a week? Yes The potential for patient to make measurable gains while on inpatient rehab is good Anticipated functional outcomes upon discharge from inpatient rehab: supervision PT, modified independent and supervision OT, modified independent SLP Estimated rehab length of stay to reach the above functional goals is: 10-14  days Anticipated discharge destination: Home 10. Overall Rehab/Functional Prognosis: good     MD Signature:

## 2024-06-10 NOTE — Discharge Summary (Signed)
 PATIENT DETAILS Name: Kyle [name] Age: 89 y.o. Sex: male Date of Birth: October 29, 1934 MRN: [id number]. Admitting Physician: Editha Ram, MD ERE:Izuupwhzm, Fonda LABOR, MD  Admit Date: 06/05/2024 Discharge date: 06/10/2024  Recommendations for Outpatient Follow-up:  Follow up with PCP in 1-2 weeks Please obtain CMP/CBC in one week Please ensure follow-up with neurology/orthopedics  Admitted From:  Home  Disposition: Rehabilitation facility-CIR   Discharge Condition: good  CODE STATUS:   Code Status: Limited: Do not attempt resuscitation (DNR) -DNR-LIMITED -Do Not Intubate/DNI    Diet recommendation:  Diet Order             Diet - low sodium heart healthy           Diet Heart Room service appropriate? Yes; Fluid consistency: Thin  Diet effective now                    Brief Summary: Patient is a 89 y.o.  male with history of mitral valve repair with postoperative A-fib in 2006, CVA in 2007, CAD, HTN, HLD who presented with a fall-subsequent imaging studies demonstrated a left shoulder fracture and a acute CVA.   Significant events: 9/26>> admit to [facility]   Significant studies: 9/25>> x-ray left shoulder: Transverse nondisplaced fracture of the distal left clavicle. 9/25>> CT head: Subacute right basal ganglia infarct. 9/25>> CT chest/abdomen/pelvis: No acute findings. 9/26>> CT angio head/neck: No LVO/aneurysm in head/neck. 9/26>> MRI brain: Acute infarct right basal ganglia with associated petechial hemorrhage, small acute infarcts in the right parietal cortex. 9/26>> echo: EF 60-65% 9/26>> A1c: 5.4 9/26>> LDL: 127   Significant microbiology data: 9/25>> COVID/influenza/RSV PCR: Negative   Procedures: None   Consults: Neurology  Brief Hospital Course: Acute CVA with a small potential hemorrhage in basal ganglia Embolic-in the setting of New onset A-fib Workup as above Exam stable-unchanged Eliquis started 9/28-seems to be tolerating  well Assessed by PT/OT-being discharged to CIR.   PAF Remains in A-fib Rate controlled Telemetry monitoring Now on Eliquis.   Left distal clavicle fracture Sling/immobilizer in place No pain Outpatient orthopedic follow-up   HLD Lipitor dosage increased to 40 mg-this will be continued on discharge Continue Lovaza    History of mitral valve repair Valve appears stable on echo Outpatient cardiology follow-up.   HTN BP stable-amlodipine  on hold   Hospital delirium Appears mild Supportive care Add melatonin nightly Delirium precautions.    Discharge Diagnoses:  Principal Problem:   Acute CVA (cerebrovascular accident) [facility]) Active Problems:   Hyperlipidemia   Atrial fibrillation (HCC)   Hypertension   Clavicle fracture   Discharge Instructions:  Activity:  As tolerated with Full fall precautions use walker/cane & assistance as needed  Discharge Instructions     Ambulatory referral to Neurology   Complete by: As directed    Follow up with stroke clinic NP at [facility] in about 4-6 weeks. Thanks.   Diet - low sodium heart healthy   Complete by: As directed    Discharge instructions   Complete by: As directed    Follow with Primary MD  Dettinger, Fonda LABOR, MD in 1-2 weeks  Please get a complete blood count and chemistry panel checked by your Primary MD at your next visit, and again as instructed by your Primary MD.  Get Medicines reviewed and adjusted: Please take all your medications with you for your next visit with your Primary MD  Laboratory/radiological data: Please request your Primary MD to go over all hospital tests and procedure/radiological results at  the follow up, please ask your Primary MD to get all Hospital records sent to his/her office.  In some cases, they will be blood work, cultures and biopsy results pending at the time of your discharge. Please request that your primary care [name] follows up on these results.  Also Note the following: If you  experience worsening of your admission symptoms, develop shortness of breath, life threatening emergency, suicidal or homicidal thoughts you must seek medical attention immediately by calling 911 or calling your MD immediately  if symptoms less severe.  You must read complete instructions/literature along with all the possible adverse reactions/side effects for all the Medicines you take and that have been prescribed to you. Take any new Medicines after you have completely understood and accpet all the possible adverse reactions/side effects.   Do not drive when taking Pain medications or sleeping medications (Benzodaizepines)  Do not take more than prescribed Pain, Sleep and Anxiety Medications. It is not advisable to combine anxiety,sleep and pain medications without talking with your primary care practitioner  Special Instructions: If you have smoked or chewed Tobacco  in the last 2 yrs please stop smoking, stop any regular Alcohol  and or any Recreational drug use.  Wear Seat belts while driving.  Please note: You were cared for by a hospitalist during your hospital stay. Once you are discharged, your primary care physician will handle any further medical issues. Please note that NO REFILLS for any discharge medications will be authorized once you are discharged, as it is imperative that you return to your primary care physician (or establish a relationship with a primary care physician if you do not have one) for your post hospital discharge needs so that they can reassess your need for medications and monitor your lab values.   Increase activity slowly   Complete by: As directed       Allergies as of 06/10/2024       Reactions   Penicillins    REACTION: dizziness/syncope        Medication List     PAUSE taking these medications    amLODipine  5 MG tablet Wait to take this until your doctor or other care provider tells you to start again. Commonly known as: NORVASC  Take 1  tablet (5 mg total) by mouth daily.       STOP taking these medications    aspirin  81 MG tablet       TAKE these medications    apixaban 5 MG Tabs tablet Commonly known as: ELIQUIS Take 1 tablet (5 mg total) by mouth 2 (two) times daily.   atorvastatin  40 MG tablet Commonly known as: LIPITOR Take 1 tablet (40 mg total) by mouth daily. What changed:  medication strength how much to take   famotidine 20 MG tablet Commonly known as: PEPCID Take 1 tablet (20 mg total) by mouth daily. Start taking on: June 11, 2024   fish oil-omega-3 fatty acids  1000 MG capsule Take 1 capsule by mouth 2 (two) times daily.   GOODYS EXTRA STRENGTH PO Take by mouth as needed. Arthritis   melatonin 5 MG Tabs Take 1 tablet (5 mg total) by mouth at bedtime.   OSTEO BI-FLEX REGULAR STRENGTH PO Take 1 tablet by mouth in the morning and at bedtime.        Follow-up Information     [facility]. Schedule an appointment as soon as possible for a visit in 1 month(s).   Specialty: Neurology Why: stroke  clinic Contact information: [address] [place]  [address] [phone]        Dettinger, Fonda LABOR, MD. Schedule an appointment as soon as possible for a visit in 1 week(s).   Specialties: Family Medicine, Cardiology Contact information: [address] [place] [address] [phone]                Allergies  Allergen Reactions   Penicillins     REACTION: dizziness/syncope     Other Procedures/Studies: ECHOCARDIOGRAM COMPLETE Result Date: 06/06/2024    ECHOCARDIOGRAM REPORT   Patient Name:   YUYA VANWINGERDEN Date of Exam: 06/06/2024 Medical Rec #:  [id number]     Height:       67.0 in Accession #:    [id number]    Weight:       155.0 lb Date of Birth:  23-Jul-1935      BSA:          1.815 m Patient Age:    89 years      BP:           132/71 mmHg Patient Gender: M             HR:           84 bpm. Exam Location:   Inpatient Procedure: 2D Echo, Cardiac Doppler and Color Doppler (Both Spectral and Color            Flow Doppler were utilized during procedure). Indications:    Stroke  History:        Patient has no prior history of Echocardiogram examinations.                 Risk Factors:Dyslipidemia and Hypertension.                  Mitral Valve: prosthetic annuloplasty ring valve is present in                 the mitral position. Procedure Date: 10/17/2005.  Sonographer:    Philomena Daring Referring Phys: [id number] [facility] KHALIQDINA IMPRESSIONS  1. Left ventricular ejection fraction, by estimation, is 60 to 65%. The left ventricle has normal function. The left ventricle has no regional wall motion abnormalities.  2. Right ventricular systolic function is normal. The right ventricular size is normal.  3. Left atrial size was mildly dilated.  4. Right atrial size was mildly dilated.  5. The mitral valve has been repaired/replaced. No evidence of mitral valve regurgitation. No evidence of mitral stenosis. There is a prosthetic annuloplasty ring present in the mitral position. Procedure Date: 10/17/2005.  6. The aortic valve is normal in structure. Aortic valve regurgitation is mild. No aortic stenosis is present.  7. The inferior vena cava is normal in size with greater than 50% respiratory variability, suggesting right atrial pressure of 3 mmHg. Conclusion(s)/Recommendation(s): No intracardiac source of embolism detected on this transthoracic study. Consider a transesophageal echocardiogram to exclude cardiac source of embolism if clinically indicated. FINDINGS  Left Ventricle: Left ventricular ejection fraction, by estimation, is 60 to 65%. The left ventricle has normal function. The left ventricle has no regional wall motion abnormalities. The left ventricular internal cavity size was normal in size. There is  no left ventricular hypertrophy. Right Ventricle: The right ventricular size is normal. No increase in right ventricular wall  thickness. Right ventricular systolic function is normal. Left Atrium: Left atrial size was mildly dilated. Right Atrium: Right atrial size was mildly dilated. Pericardium: There  is no evidence of pericardial effusion. Mitral Valve: The mitral valve has been repaired/replaced. No evidence of mitral valve regurgitation. There is a prosthetic annuloplasty ring present in the mitral position. Procedure Date: 10/17/2005. No evidence of mitral valve stenosis. Tricuspid Valve: The tricuspid valve is normal in structure. Tricuspid valve regurgitation is mild . No evidence of tricuspid stenosis. Aortic Valve: The aortic valve is normal in structure. Aortic valve regurgitation is mild. No aortic stenosis is present. Pulmonic Valve: The pulmonic valve was normal in structure. Pulmonic valve regurgitation is not visualized. No evidence of pulmonic stenosis. Aorta: The aortic root is normal in size and structure. Venous: The inferior vena cava is normal in size with greater than 50% respiratory variability, suggesting right atrial pressure of 3 mmHg. IAS/Shunts: No atrial level shunt detected by color flow Doppler.  LEFT VENTRICLE PLAX 2D LVIDd:         4.90 cm   Diastology LVIDs:         3.50 cm   LV e' medial:    6.64 cm/s LV PW:         1.10 cm   LV E/e' medial:  22.3 LV IVS:        1.40 cm   LV e' lateral:   7.07 cm/s LVOT diam:     2.20 cm   LV E/e' lateral: 20.9 LV SV:         52 LV SV Index:   29 LVOT Area:     3.80 cm  RIGHT VENTRICLE RV S prime:     9.46 cm/s TAPSE (M-mode): 1.6 cm LEFT ATRIUM             Index        RIGHT ATRIUM           Index LA Vol (A2C):   60.1 ml 33.12 ml/m  RA Area:     22.30 cm LA Vol (A4C):   64.3 ml 35.44 ml/m  RA Volume:   64.20 ml  35.38 ml/m LA Biplane Vol: 62.8 ml 34.61 ml/m  AORTIC VALVE LVOT Vmax:   81.60 cm/s LVOT Vmean:  50.700 cm/s LVOT VTI:    0.137 m  AORTA Ao Root diam: 2.80 cm Ao Asc diam:  2.90 cm MITRAL VALVE                TRICUSPID VALVE MV Area (PHT): 2.21 cm     TR  Peak grad:   14.4 mmHg MV Decel Time: 344 msec     TR Vmax:        190.00 cm/s MV E velocity: 148.00 cm/s                             SHUNTS                             Systemic VTI:  0.14 m                             Systemic Diam: 2.20 cm Oneil Parchment MD Electronically signed by Oneil Parchment MD Signature Date/Time: 06/06/2024/1:20:44 PM    Final    MR BRAIN WO CONTRAST Result Date: 06/06/2024 EXAM: MRI BRAIN WITHOUT CONTRAST 06/06/2024 05:21:59 AM TECHNIQUE: Multiplanar multisequence MRI of the head/brain was performed without the administration of intravenous contrast. COMPARISON: CT of the head dated 06/05/2024  and MRI of the head dated 11/10/2005. CLINICAL HISTORY: Neuro deficit, acute, stroke suspected. FINDINGS: BRAIN AND VENTRICLES: Restricted diffusion present within the right basal ganglia compatible with acute nonhemorrhagic infarct. Restricted diffusion present within the right parietal lobe cortex compatible with small embolic infarcts. Blooming artifact present within the right basal ganglia, suggesting petechial hemorrhage. Focus of hemosiderin staining present laterally within the left cerebellar hemisphere. Moderate periventricular white matter disease. No mass. No midline shift. No hydrocephalus. No significant cerebral swelling or mass effect. The sella is unremarkable. Normal flow voids. ORBITS: No acute abnormality. SINUSES AND MASTOIDS: No acute abnormality. BONES AND SOFT TISSUES: Normal marrow signal. No acute soft tissue abnormality. IMPRESSION: 1. Acute infarct in the right basal ganglia with associated petechial hemorrhage. 2. Small acute embolic infarcts in the right parietal cortex. 3. No significant cerebral swelling or mass effect. 4. Moderate chronic periventricular white matter disease. Electronically signed by: Evalene Coho MD 06/06/2024 05:59 AM EDT RP Workstation: [id number]   CT ANGIO HEAD NECK W WO CM Result Date: 06/06/2024 EXAM: CTA HEAD AND NECK WITH AND WITHOUT  06/06/2024 04:41:43 AM TECHNIQUE: CTA of the head and neck was performed with and without the administration of 75 mL iohexol  (OMNIPAQUE ) 350 MG/ML injection. Multiplanar 2D and/or 3D reformatted images are provided for review. Automated exposure control, iterative reconstruction, and/or weight based adjustment of the mA/kV was utilized to reduce the radiation dose to as low as reasonably achievable. Stenosis of the internal carotid arteries measured using NASCET criteria. COMPARISON: None available CLINICAL HISTORY: Neuro deficit, acute, stroke suspected. FINDINGS: CTA NECK: AORTIC ARCH AND ARCH VESSELS: There is mild-to-moderate calcific atheromatous disease within the aortic arch. No dissection or arterial injury. No significant stenosis of the brachiocephalic or subclavian arteries. CERVICAL CAROTID ARTERIES: There is calcific atheromatous disease within the carotid bulbs bilaterally, with less than 20% luminal stenosis bilaterally. No dissection or arterial injury. CERVICAL VERTEBRAL ARTERIES: The vertebral arteries are codominant and normal in caliber throughout the respective courses. No dissection, arterial injury, or significant stenosis. LUNGS AND MEDIASTINUM: Unremarkable. SOFT TISSUES: No acute abnormality. BONES: No acute abnormality. CTA HEAD: ANTERIOR CIRCULATION: No significant stenosis of the internal carotid arteries. No significant stenosis of the anterior cerebral arteries. No significant stenosis of the middle cerebral arteries. No aneurysm. POSTERIOR CIRCULATION: There is fetal type origin of the posterior cerebral arteries bilaterally. No significant stenosis of the posterior cerebral arteries. No significant stenosis of the basilar artery. No significant stenosis of the vertebral arteries. No aneurysm. OTHER: No dural venous sinus thrombosis on this non-dedicated study. IMPRESSION: 1. No large vessel occlusion or aneurysm in the head or neck. 2. Bilateral carotid bulb atherosclerosis with  less than 20% luminal stenosis. 3. Fetal-type origin of both posterior cerebral arteries (anatomic variant). Electronically signed by: Evalene Coho MD 06/06/2024 05:54 AM EDT RP Workstation: [id number]   CT Head Wo Contrast Addendum Date: 06/05/2024 ADDENDUM REPORT: 06/05/2024 21:46 ADDENDUM: These results were called by telephone at the time of interpretation on 06/05/2024 at 9:46 pm to provider [facility] , who verbally acknowledged these results. Electronically Signed   By: Morgane  Naveau [name]   On: 06/05/2024 21:46   Result Date: 06/05/2024 CLINICAL DATA:  Head trauma, minor (Age >= 65y); Neck trauma (Age >= 65y). Altered mental status. EXAM: CT HEAD WITHOUT CONTRAST CT CERVICAL SPINE WITHOUT CONTRAST TECHNIQUE: Multidetector CT imaging of the head and cervical spine was performed following the standard protocol without intravenous contrast. Multiplanar CT image reconstructions of the cervical spine were also  generated. RADIATION DOSE REDUCTION: This exam was performed according to the departmental dose-optimization program which includes automated exposure control, adjustment of the mA and/or kV according to patient size and/or use of iterative reconstruction technique. COMPARISON:  None Available. FINDINGS: CT HEAD FINDINGS Brain: Cerebral ventricle sizes are concordant with the degree of cerebral volume loss. Patchy and confluent areas of decreased attenuation are noted throughout the deep and periventricular white matter of the cerebral hemispheres bilaterally, compatible with chronic microvascular ischemic disease. Loss of gray-white matter differentiation of the right basal ganglia with associated vasogenic edema and mild mass effect. No parenchymal hemorrhage. Few scattered 1-3 mm cerebral and cerebellar calcifications. No mass lesion. No extra-axial collection. No midline shift. No hydrocephalus. Basilar cisterns are patent. Vascular: No hyperdense vessel. Skull: No acute fracture or focal  lesion. Sinuses/Orbits: Paranasal sinuses and mastoid air cells are clear. The. Otherwise the orbits are unremarkable. Other: None. CT CERVICAL SPINE FINDINGS Alignment: Normal. Skull base and vertebrae: Diffusely decreased bone density. Multilevel mild degenerative changes of the spine. No acute fracture. No aggressive appearing focal osseous lesion or focal pathologic process. Soft tissues and spinal canal: No prevertebral fluid or swelling. No visible canal hematoma. Upper chest: A paraseptal and centrilobular emphysematous changes. Other: Atherosclerotic plaque of the aortic arch and its main branches. Atherosclerotic plaque of the carotid arteries within the neck. IMPRESSION: 1. Acute to early subacute right basal ganglia infarction. 2. No acute intracranial hemorrhage. 3. No acute displaced fracture or traumatic listhesis of the cervical spine. 4. Aortic Atherosclerosis (ICD10-I70.0) and Emphysema (ICD10-J43.9). Electronically Signed: By: Morgane  Naveau [name] On: 06/05/2024 21:42   CT Cervical Spine Wo Contrast Addendum Date: 06/05/2024 ADDENDUM REPORT: 06/05/2024 21:46 ADDENDUM: These results were called by telephone at the time of interpretation on 06/05/2024 at 9:46 pm to provider [facility] , who verbally acknowledged these results. Electronically Signed   By: Morgane  Naveau [name]   On: 06/05/2024 21:46   Result Date: 06/05/2024 CLINICAL DATA:  Head trauma, minor (Age >= 65y); Neck trauma (Age >= 65y). Altered mental status. EXAM: CT HEAD WITHOUT CONTRAST CT CERVICAL SPINE WITHOUT CONTRAST TECHNIQUE: Multidetector CT imaging of the head and cervical spine was performed following the standard protocol without intravenous contrast. Multiplanar CT image reconstructions of the cervical spine were also generated. RADIATION DOSE REDUCTION: This exam was performed according to the departmental dose-optimization program which includes automated exposure control, adjustment of the mA and/or kV according to  patient size and/or use of iterative reconstruction technique. COMPARISON:  None Available. FINDINGS: CT HEAD FINDINGS Brain: Cerebral ventricle sizes are concordant with the degree of cerebral volume loss. Patchy and confluent areas of decreased attenuation are noted throughout the deep and periventricular white matter of the cerebral hemispheres bilaterally, compatible with chronic microvascular ischemic disease. Loss of gray-white matter differentiation of the right basal ganglia with associated vasogenic edema and mild mass effect. No parenchymal hemorrhage. Few scattered 1-3 mm cerebral and cerebellar calcifications. No mass lesion. No extra-axial collection. No midline shift. No hydrocephalus. Basilar cisterns are patent. Vascular: No hyperdense vessel. Skull: No acute fracture or focal lesion. Sinuses/Orbits: Paranasal sinuses and mastoid air cells are clear. The. Otherwise the orbits are unremarkable. Other: None. CT CERVICAL SPINE FINDINGS Alignment: Normal. Skull base and vertebrae: Diffusely decreased bone density. Multilevel mild degenerative changes of the spine. No acute fracture. No aggressive appearing focal osseous lesion or focal pathologic process. Soft tissues and spinal canal: No prevertebral fluid or swelling. No visible canal hematoma. Upper chest: A paraseptal  and centrilobular emphysematous changes. Other: Atherosclerotic plaque of the aortic arch and its main branches. Atherosclerotic plaque of the carotid arteries within the neck. IMPRESSION: 1. Acute to early subacute right basal ganglia infarction. 2. No acute intracranial hemorrhage. 3. No acute displaced fracture or traumatic listhesis of the cervical spine. 4. Aortic Atherosclerosis (ICD10-I70.0) and Emphysema (ICD10-J43.9). Electronically Signed: By: Morgane  Naveau [name] On: 06/05/2024 21:42   CT CHEST ABDOMEN PELVIS W CONTRAST Result Date: 06/05/2024 CLINICAL DATA:  Polytrauma, blunt AMS, ?sepsis. question sepsis. EXAM: CT CHEST,  ABDOMEN, AND PELVIS WITH CONTRAST TECHNIQUE: Multidetector CT imaging of the chest, abdomen and pelvis was performed following the standard protocol during bolus administration of intravenous contrast. RADIATION DOSE REDUCTION: This exam was performed according to the departmental dose-optimization program which includes automated exposure control, adjustment of the mA and/or kV according to patient size and/or use of iterative reconstruction technique. CONTRAST:  65mL OMNIPAQUE  IOHEXOL  350 MG/ML SOLN COMPARISON:  11/10/2005 FINDINGS: CT CHEST FINDINGS Cardiovascular: Heart is borderline in size. Aorta normal caliber. Aortic atherosclerosis. Mediastinum/Nodes: No mediastinal, hilar, or axillary adenopathy. Trachea and esophagus are unremarkable. Thyroid unremarkable. Lungs/Pleura: No confluent airspace opacities or effusions. No pneumothorax. Musculoskeletal: Chest wall soft tissues are unremarkable. No acute bony abnormality. CT ABDOMEN PELVIS FINDINGS Hepatobiliary: No focal hepatic abnormality. Gallbladder unremarkable. Pancreas: No focal abnormality or ductal dilatation. Spleen: No focal abnormality.  Normal size. Adrenals/Urinary Tract: Stable left adrenal nodule compatible with adenoma. Right adrenal gland normal. 5.3 cm cyst in the upper pole of the right kidney appears simple. No follow-up imaging recommended. No hydronephrosis. Stomach/Bowel: Left colonic diverticulosis. No active diverticulitis. Stomach and small bowel decompressed. No bowel obstruction or inflammatory process. Vascular/Lymphatic: Aortic atherosclerosis. No evidence of aneurysm or adenopathy. Reproductive: No visible focal abnormality. Other: No free fluid or free air. Musculoskeletal: No acute bony abnormality. IMPRESSION: No acute findings or significant traumatic injury in the chest, abdomen or pelvis. Aortic atherosclerosis. Left colonic diverticulosis. Electronically Signed   By: Franky Crease [name]   On: 06/05/2024 21:40   DG  Shoulder Left Result Date: 06/05/2024 CLINICAL DATA:  Left shoulder pain after a fall.  Abrasions. EXAM: DG SHOULDER 2+V*L* COMPARISON:  None Available. FINDINGS: Transverse mildly comminuted fracture of the distal left clavicle. Fracture does not appear to involve the acromioclavicular joint. No significant displacement. The glenohumeral joint appears intact without additional fracture or dislocation. Degenerative changes in the glenohumeral and acromioclavicular joints. No focal bone lesions identified. Soft tissues are unremarkable. IMPRESSION: Transverse nondisplaced fracture of the distal left clavicle. Electronically Signed   By: Elsie Gravely [name]   On: 06/05/2024 19:31     TODAY-DAY OF DISCHARGE:  Subjective:   Fairy Redo today has no headache,no chest abdominal pain,no new weakness tingling or numbness, feels much better wants to go home today.   Objective:   Blood pressure 125/79, pulse 74, temperature 98.6 F (37 C), temperature source Oral, resp. rate 19, height 5' 7 (1.702 m), weight 70.3 kg, SpO2 99%.  Intake/Output Summary (Last 24 hours) at 06/10/2024 1155 Last data filed at 06/10/2024 0900 Gross per 24 hour  Intake 240 ml  Output 200 ml  Net 40 ml   Filed Weights   06/05/24 1746  Weight: 70.3 kg    Exam: Awake Alert, Oriented *3, No new F.N deficits, Normal affect [place].AT,PERRAL Supple Neck,No JVD, No cervical lymphadenopathy appriciated.  Symmetrical Chest wall movement, Good air movement bilaterally, CTAB RRR,No Gallops,Rubs or new Murmurs, No Parasternal Heave +ve B.Sounds, Abd Soft, Non tender,  No organomegaly appriciated, No rebound -guarding or rigidity. No Cyanosis, Clubbing or edema, No new Rash or bruise   PERTINENT RADIOLOGIC STUDIES: No results found.   PERTINENT LAB RESULTS: CBC: No results for input(s): WBC, HGB, HCT, PLT in the last 72 hours. CMET CMP     Component Value Date/Time   NA 138 06/06/2024 0907   NA 138 12/05/2023  0827   K 3.6 06/06/2024 0907   CL 103 06/06/2024 0907   CO2 19 (L) 06/06/2024 0907   GLUCOSE 97 06/06/2024 0907   BUN 8 06/06/2024 0907   BUN 10 12/05/2023 0827   CREATININE 0.65 06/06/2024 0907   CREATININE 0.81 02/17/2013 1230   CALCIUM  8.6 (L) 06/06/2024 0907   PROT 6.3 (L) 06/06/2024 0907   PROT 6.4 12/05/2023 0827   ALBUMIN 3.5 06/06/2024 0907   ALBUMIN 4.3 12/05/2023 0827   AST 34 06/06/2024 0907   ALT 22 06/06/2024 0907   ALKPHOS 40 06/06/2024 0907   BILITOT 1.3 (H) 06/06/2024 0907   BILITOT 0.5 12/05/2023 0827   EGFR 87 12/05/2023 0827   GFRNONAA >60 06/06/2024 0907   GFRNONAA 86 02/17/2013 1230    GFR Estimated Creatinine Clearance: 60.8 mL/min (by C-G formula based on SCr of 0.65 mg/dL). No results for input(s): LIPASE, AMYLASE in the last 72 hours. No results for input(s): CKTOTAL, CKMB, CKMBINDEX, TROPONINI in the last 72 hours. Invalid input(s): POCBNP No results for input(s): DDIMER in the last 72 hours. No results for input(s): HGBA1C in the last 72 hours. No results for input(s): CHOL, HDL, LDLCALC, TRIG, CHOLHDL, LDLDIRECT in the last 72 hours. No results for input(s): TSH, T4TOTAL, T3FREE, THYROIDAB in the last 72 hours.  Invalid input(s): FREET3 No results for input(s): VITAMINB12, FOLATE, FERRITIN, TIBC, IRON, RETICCTPCT in the last 72 hours. Coags: No results for input(s): INR in the last 72 hours.  Invalid input(s): PT Microbiology: Recent Results (from the past 240 hours)  Culture, blood (routine x 2)     Status: None   Collection Time: 06/05/24  7:00 PM   Specimen: BLOOD  Result Value Ref Range Status   Specimen Description BLOOD LEFT ANTECUBITAL  Final   Special Requests   Final    BOTTLES DRAWN AEROBIC ONLY Blood Culture adequate volume   Culture   Final    NO GROWTH 5 DAYS Performed at [facility], [address], [place], [place] [address]    Report Status 06/10/2024 FINAL   Final  Resp panel by RT-PCR (RSV, Flu A&B, Covid) Anterior Nasal Swab     Status: None   Collection Time: 06/05/24  7:43 PM   Specimen: Anterior Nasal Swab  Result Value Ref Range Status   SARS Coronavirus 2 by RT PCR NEGATIVE NEGATIVE Final   Influenza A by PCR NEGATIVE NEGATIVE Final   Influenza B by PCR NEGATIVE NEGATIVE Final    Comment: (NOTE) The Xpert Xpress SARS-CoV-2/FLU/RSV plus assay is intended as an aid in the diagnosis of influenza from Nasopharyngeal swab specimens and should not be used as a sole basis for treatment. Nasal washings and aspirates are unacceptable for Xpert Xpress SARS-CoV-2/FLU/RSV testing.  Fact Sheet for Patients: BloggerCourse.com  Fact Sheet for Healthcare Providers: SeriousBroker.it  This test is not yet approved or cleared by the [place]  FDA and has been authorized for detection and/or diagnosis of SARS-CoV-2 by FDA under an Emergency Use Authorization (EUA). This EUA will remain in effect (meaning this test can be used) for the duration of  the COVID-19 declaration under Section 564(b)(1) of the Act, 21 U.S.C. section 360bbb-3(b)(1), unless the authorization is terminated or revoked.     Resp Syncytial Virus by PCR NEGATIVE NEGATIVE Final    Comment: (NOTE) Fact Sheet for Patients: BloggerCourse.com  Fact Sheet for Healthcare Providers: SeriousBroker.it  This test is not yet approved or cleared by the [place]  FDA and has been authorized for detection and/or diagnosis of SARS-CoV-2 by FDA under an Emergency Use Authorization (EUA). This EUA will remain in effect (meaning this test can be used) for the duration of the COVID-19 declaration under Section 564(b)(1) of the Act, 21 U.S.C. section 360bbb-3(b)(1), unless the authorization is terminated or revoked.  Performed at [facility], [address] [place],  [place] [address]   Culture, blood (routine x 2)     Status: None (Preliminary result)   Collection Time: 06/06/24  9:07 AM   Specimen: BLOOD RIGHT HAND  Result Value Ref Range Status   Specimen Description BLOOD RIGHT HAND  Final   Special Requests AEROBIC BOTTLE ONLY Blood Culture adequate volume  Final   Culture   Final    NO GROWTH 4 DAYS Performed at [facility], [address], [place], [place] [address]    Report Status PENDING  Incomplete    FURTHER DISCHARGE INSTRUCTIONS:  Get Medicines reviewed and adjusted: Please take all your medications with you for your next visit with your Primary MD  Laboratory/radiological data: Please request your Primary MD to go over all hospital tests and procedure/radiological results at the follow up, please ask your Primary MD to get all Hospital records sent to his/her office.  In some cases, they will be blood work, cultures and biopsy results pending at the time of your discharge. Please request that your primary care [name] goes through all the records of your hospital data and follows up on these results.  Also Note the following: If you experience worsening of your admission symptoms, develop shortness of breath, life threatening emergency, suicidal or homicidal thoughts you must seek medical attention immediately by calling 911 or calling your MD immediately  if symptoms less severe.  You must read complete instructions/literature along with all the possible adverse reactions/side effects for all the Medicines you take and that have been prescribed to you. Take any new Medicines after you have completely understood and accpet all the possible adverse reactions/side effects.   Do not drive when taking Pain medications or sleeping medications (Benzodaizepines)  Do not take more than prescribed Pain, Sleep and Anxiety Medications. It is not advisable to combine anxiety,sleep and pain medications without talking with your primary care  practitioner  Special Instructions: If you have smoked or chewed Tobacco  in the last 2 yrs please stop smoking, stop any regular Alcohol  and or any Recreational drug use.  Wear Seat belts while driving.  Please note: You were cared for by a hospitalist during your hospital stay. Once you are discharged, your primary care physician will handle any further medical issues. Please note that NO REFILLS for any discharge medications will be authorized once you are discharged, as it is imperative that you return to your primary care physician (or establish a relationship with a primary care physician if you do not have one) for your post hospital discharge needs so that they can reassess your need for medications and monitor your lab values.  Total Time spent coordinating discharge including counseling, education and face to face time equals greater  than 30 minutes.  SignedBETHA Donalda Applebaum 06/10/2024 11:55 AM

## 2024-06-11 DIAGNOSIS — I6381 Other cerebral infarction due to occlusion or stenosis of small artery: Secondary | ICD-10-CM | POA: Diagnosis not present

## 2024-06-11 LAB — COMPREHENSIVE METABOLIC PANEL WITH GFR
ALT: 14 U/L (ref 0–44)
AST: 17 U/L (ref 15–41)
Albumin: 3 g/dL — ABNORMAL LOW (ref 3.5–5.0)
Alkaline Phosphatase: 44 U/L (ref 38–126)
Anion gap: 9 (ref 5–15)
BUN: 11 mg/dL (ref 8–23)
CO2: 25 mmol/L (ref 22–32)
Calcium: 8.5 mg/dL — ABNORMAL LOW (ref 8.9–10.3)
Chloride: 101 mmol/L (ref 98–111)
Creatinine, Ser: 0.71 mg/dL (ref 0.61–1.24)
GFR, Estimated: >60 mL/min (ref >60)
Glucose, Bld: 117 mg/dL — ABNORMAL HIGH (ref 70–99)
Potassium: 4.1 mmol/L (ref 3.5–5.1)
Sodium: 135 mmol/L (ref 135–145)
Total Bilirubin: 0.9 mg/dL (ref 0.0–1.2)
Total Protein: 5.7 g/dL — ABNORMAL LOW (ref 6.5–8.1)

## 2024-06-11 LAB — CBC WITH DIFFERENTIAL/PLATELET
Abs Immature Granulocytes: 0.08 K/uL — ABNORMAL HIGH (ref 0.00–0.07)
Basophils Absolute: 0.1 K/uL (ref 0.0–0.1)
Basophils Relative: 1 %
Eosinophils Absolute: 0.3 K/uL (ref 0.0–0.5)
Eosinophils Relative: 3 %
HCT: 37.9 % — ABNORMAL LOW (ref 39.0–52.0)
Hemoglobin: 12.9 g/dL — ABNORMAL LOW (ref 13.0–17.0)
Immature Granulocytes: 1 %
Lymphocytes Relative: 26 %
Lymphs Abs: 2.2 K/uL (ref 0.7–4.0)
MCH: 30.3 pg (ref 26.0–34.0)
MCHC: 34 g/dL (ref 30.0–36.0)
MCV: 89 fL (ref 80.0–100.0)
Monocytes Absolute: 0.7 K/uL (ref 0.1–1.0)
Monocytes Relative: 9 %
Neutro Abs: 5 K/uL (ref 1.7–7.7)
Neutrophils Relative %: 60 %
Platelets: 251 K/uL (ref 150–400)
RBC: 4.26 MIL/uL (ref 4.22–5.81)
RDW: 13.2 % (ref 11.5–15.5)
WBC: 8.3 K/uL (ref 4.0–10.5)
nRBC: 0 % (ref 0.0–0.2)

## 2024-06-11 LAB — CULTURE, BLOOD (ROUTINE X 2)
Culture: NO GROWTH
Special Requests: ADEQUATE

## 2024-06-11 NOTE — Progress Notes (Addendum)
 Occupational Therapy Assessment and Plan  Patient Details  Name: Kyle [name] MRN: [id number] Date of Birth: December 26, 1934  OT Diagnosis: acute pain and muscle weakness (generalized) Rehab Potential: Rehab Potential (ACUTE ONLY): Excellent ELOS: 7 days   Today's Date: 06/11/2024 OT Individual Time: 1102-1212 OT Individual Time Calculation (min): 70 min     Pt seen for initial evaluation and ADL training with a focus on pt learning adaptive strategies to compensate for limited use of LUE.   Pt showered and dressed.  Without AE or cues, pt needs min A overall, but with use of a long sponge he was able to bathe self with supervision and only needed A to fasten buttons and tying pajama pants.  Pt will do better with a pullover shirt and pull on gym shorts.  Pt ambulated around room with S and stood at sink with S to complete oral care and shaving.   When cued to sit down on various seats in room, pt tended to walk past the seat.  Unsure if this was visual or cognitive. Will continue to assess.  Reviewed role of OT, discussed POC and pt's goals, and ELOS. Pt resting in recliner With all needs met and belt alarm set.     Hospital Problem: Principal Problem:   Stroke of right basal ganglia [facility])   Past Medical History:  Past Medical History:  Diagnosis Date   Atrial fibrillation [facility])    Post operative, maintaining normal sinus rhythm on amiodarone   Benign prostatic hypertrophy    CAD (coronary artery disease)    Non obstructive by catheterization Feb 2007 prior to his surgery revealing a 40-50% LAD lesion, good LV function with EF 55-60% by last echocardiogram prior to his surgery   Hyperlipidemia    Treated   Mitral regurgitation    S/P mitral valve repair Oct 17, 2005 by Dr. Dusty   Skin cancer    Past Surgical History:  Past Surgical History:  Procedure Laterality Date   CARDIAC CATHETERIZATION  10/2005   COLONOSCOPY     MITRAL VALVE REPAIR  10/17/2005   Dr. Dusty   POLYPECTOMY      SKIN CANCER EXCISION Left    cheek    Assessment & Plan Clinical Impression: Kyle [name] is an 89 year old male with history of BPH, non-obst CAD s/p MVR, colon polyps, post op A fib who was admitted on 06/05/24 after found down with mental status changes, left facial droop and left shoulder pain. Family had seen patient few hours earlier. WBC elevated at 13.4 and Sepsis work up w/respiratory panel negative. He was found to have left transverse non-displaced distal clavicle Fx and acute infarct in right basal ganglia with associated petechial hemorrhage as well as small acute embolic infarcts in right parietal cortex. CTA negative for LVO.  2 D echo showed EF 60-65% with nor wall abnormality and annuloplasty ring in mitral position. He was noted to be in A fib at admission and stroke felt to be embolic in setting of A fib.    AC held due to petechial hemorrhage. Sling ordered for clavicle Fx and made NWB LUE.  As neurologically stable, ASA changed to Eliquis on 09/28 and Lipitor increased to 40 mg due to LDL-127. He continues to have mild left sided weakness with facial droop and right gaze preference.  PT/OT consulted and patient noted to have mild confusion with difficulty following multi-step commands, has ataxic gait and requires min assist with ADLs and mobility. He  was independent and driving PTA.   CIR recommended due to functional decline.     Patient transferred to CIR on 06/10/2024 .    Patient currently requires min with basic self-care skills secondary to muscle weakness, decreased visual motor skills, and decreased standing balance.  Prior to hospitalization, patient was fully independent.  Patient will benefit from skilled intervention to increase independence with basic self-care skills prior to discharge home with care partner.  Anticipate patient will require intermittent supervision and follow up home health.  OT - End of Session Activity Tolerance: Tolerates 30+ min activity with  multiple rests Endurance Deficit: Yes OT Assessment Rehab Potential (ACUTE ONLY): Excellent OT Barriers to Discharge: None OT Patient demonstrates impairments in the following area(s): Balance;Vision OT Basic ADL's Functional Problem(s): Bathing;Dressing;Toileting OT Advanced ADL's Functional Problem(s): Simple Meal Preparation OT Transfers Functional Problem(s): Toilet;Tub/Shower OT Additional Impairment(s): None OT Plan OT Intensity: Minimum of 1-2 x/day, 45 to 90 minutes OT Frequency: 5 out of 7 days OT Duration/Estimated Length of Stay: 7 days OT Treatment/Interventions: Balance/vestibular training;Discharge planning;Cognitive remediation/compensation;DME/adaptive equipment instruction;Functional mobility training;Patient/family education;Psychosocial support;Self Care/advanced ADL retraining;Therapeutic Activities;Therapeutic Exercise;UE/LE Strength taining/ROM;UE/LE Coordination activities;Visual/perceptual remediation/compensation;Pain management;Neuromuscular re-education OT Self Feeding Anticipated Outcome(s): independent OT Basic Self-Care Anticipated Outcome(s): mod I with dressing,  set up bathing OT Toileting Anticipated Outcome(s): mod I OT Bathroom Transfers Anticipated Outcome(s): mod I to toilet, supervision to walk in shower OT Recommendation Recommendations for Other Services: None Patient destination: Home Follow Up Recommendations: Home health OT Equipment Recommended: None recommended by OT   OT Evaluation Precautions/Restrictions    General   Vital Signs   Pain Pain Assessment Pain Scale: 0-10 Pain Score: 0-No pain Home Living/Prior Functioning Home Living Family/patient expects to be discharged to:: Private residence Living Arrangements: Alone Available Help at Discharge: Available PRN/intermittently (need to confirm level of assist from family. 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 Does his own yard work. Vision Baseline Vision/History: 1 Wears glasses Ability to See in Adequate Light: 0 Adequate Patient Visual Report: No change from baseline Vision Assessment?: No apparent visual deficits Eye Alignment: Within Functional Limits Tracking/Visual Pursuits: Decreased smoothness of horizontal tracking;Decreased smoothness of vertical tracking Saccades: Additional eye shifts occurred during testing;Additional head turns occurred during testing Visual Fields: Impaired-to be further tested in functional context;Other (comment) (pt appeared to miss items on his L during ambulation, will need to assess further) Perception  Perception: Within Functional Limits Praxis Praxis: WFL Cognition Cognition Overall Cognitive Status: No family/caregiver present to determine baseline cognitive functioning Arousal/Alertness: Awake/alert Memory: Impaired Memory Impairment: Decreased recall of new information Awareness: Appears intact Problem Solving: Appears intact Safety/Judgment: Appears intact Brief Interview for Mental Status (BIMS) Repetition of Three Words (First Attempt): 3 Temporal Orientation: Year: Correct Temporal Orientation: Month: Accurate within 5 days Temporal Orientation: Day: Incorrect Recall: Sock: Yes, after cueing (something to wear) Recall: Blue: Yes, no cue required Recall: Bed: Yes, no cue required BIMS Summary Score: 13 Sensation Sensation Light Touch: Appears Intact Hot/Cold: Appears Intact Proprioception:  Impaired by gross assessment Stereognosis: Appears Intact Coordination Gross  Motor Movements are Fluid and Coordinated: No Fine Motor Movements are Fluid and Coordinated: No Motor  Motor Motor: Other (comment) Motor - Skilled Clinical Observations: Generalized deconditoning and pain from clavicle fx  Trunk/Postural Assessment  Cervical Assessment Cervical Assessment: Exceptions to [facility] (forward head) Thoracic Assessment Thoracic Assessment: Exceptions to [facility] (B shlder elevation, rounded) Lumbar Assessment Lumbar Assessment: Exceptions to [facility] (tilted posteriorly) Postural Control Postural Control: Deficits on evaluation Trunk Control: Posterior bias - mild - able to self correct  Balance Balance Balance Assessed: Yes Static Sitting Balance Static Sitting - Balance Support: Feet supported;No upper extremity supported Static Sitting - Level of Assistance: 7: Independent Dynamic Sitting Balance Dynamic Sitting - Balance Support: Feet supported;No upper extremity supported Dynamic Sitting - Level of Assistance: 5: Stand by assistance Static Standing Balance Static Standing - Balance Support: No upper extremity supported Static Standing - Level of Assistance: 5: Stand by assistance Dynamic Standing Balance Dynamic Standing - Balance Support: No upper extremity supported;During functional activity Dynamic Standing - Level of Assistance: 4: Min assist Extremity/Trunk Assessment RUE Assessment RUE Assessment: Within Functional Limits LUE Assessment Active Range of Motion (AROM) Comments: no shoulder AROM due to clavicle fx, NWB, in a sling General Strength Comments: able to use elbow and hand functionally  Care Tool Care Tool Self Care Eating   Eating Assist Level: Set up assist    Oral Care    Oral Care Assist Level: Set up assist    Bathing   Body parts bathed by patient: Chest;Left arm;Abdomen;Front perineal area;Buttocks;Right upper leg;Left upper leg;Right lower  leg;Face;Left lower leg Body parts bathed by helper: Right arm   Assist Level: Minimal Assistance - Patient > 75%    Upper Body Dressing(including orthotics)   What is the patient wearing?: Button up shirt   Assist Level: Minimal Assistance - Patient > 75%    Lower Body Dressing (excluding footwear)   What is the patient wearing?: Underwear/pull up;Pants Assist for lower body dressing: Minimal Assistance - Patient > 75%    Putting on/Taking off footwear   What is the patient wearing?: Non-skid slipper socks Assist for footwear: Supervision/Verbal cueing       Care Tool Toileting Toileting activity   Assist for toileting: Minimal Assistance - Patient > 75%     Care Tool Bed Mobility Roll left and right activity   Roll left and right assist level: Minimal Assistance - Patient > 75%    Sit to lying activity   Sit to lying assist level: Minimal Assistance - Patient > 75%    Lying to sitting on side of bed activity   Lying to sitting on side of bed assist level: the ability to move from lying on the back to sitting on the side of the bed with no back support.: Minimal Assistance - Patient > 75%     Care Tool Transfers Sit to stand transfer   Sit to stand assist level: Minimal Assistance - Patient > 75%    Chair/bed transfer   Chair/bed transfer assist level: Minimal Assistance - Patient > 75%     Toilet transfer   Assist Level: Minimal Assistance - Patient > 75%     Care Tool Cognition  Expression of Ideas and Wants Expression of Ideas and Wants: 4. Without difficulty (complex and basic) - expresses complex messages without difficulty and with speech that is clear and easy to understand  Understanding Verbal and Non-Verbal Content Understanding Verbal and Non-Verbal Content: 3. Usually understands - understands most conversations, but  misses some part/intent of message. Requires cues at times to understand   Memory/Recall Ability Memory/Recall Ability : Current  season;That he or she is in a hospital/hospital unit   Refer to Care Plan for Long Term Goals  SHORT TERM GOAL WEEK 1 OT Short Term Goal 1 (Week 1): STGs = LTGs  Recommendations for other services: None    Skilled Therapeutic Intervention ADL ADL ADL Comments: pt requires min A with self care but with the use of a long sponge, he can bathe with supervision, he could also dress with supervision if he had a tshirt or pull on gym shorts vs pajamas with a tie Mobility  Bed Mobility Bed Mobility: Supine to Sit;Sit to Supine Supine to Sit: Minimal Assistance - Patient > 75% Sit to Supine: Minimal Assistance - Patient > 75% Transfers Sit to Stand: Minimal Assistance - Patient > 75% Stand to Sit: Minimal Assistance - Patient > 75%   Discharge Criteria: Patient will be discharged from OT if patient refuses treatment 3 consecutive times without medical reason, if treatment goals not met, if there is a change in medical status, if patient makes no progress towards goals or if patient is discharged from hospital.  The above assessment, treatment plan, treatment alternatives and goals were discussed and mutually agreed upon: by patient  [facility] 06/11/2024, 12:52 PM

## 2024-06-11 NOTE — Plan of Care (Signed)
  Problem: RH Balance Goal: LTG Patient will maintain dynamic standing with ADLs (OT) Description: LTG:  Patient will maintain dynamic standing balance with assist during activities of daily living (OT)  Flowsheets (Taken 06/11/2024 1306) LTG: Pt will maintain dynamic standing balance during ADLs with: Independent   Problem: Sit to Stand Goal: LTG:  Patient will perform sit to stand in prep for activites of daily living with assistance level (OT) Description: LTG:  Patient will perform sit to stand in prep for activites of daily living with assistance level (OT) Flowsheets (Taken 06/11/2024 1306) LTG: PT will perform sit to stand in prep for activites of daily living with assistance level: Independent   Problem: RH Bathing Goal: LTG Patient will bathe all body parts with assist levels (OT) Description: LTG: Patient will bathe all body parts with assist levels (OT) Flowsheets (Taken 06/11/2024 1306) LTG: Pt will perform bathing with assistance level/cueing: Set up assist    Problem: RH Dressing Goal: LTG Patient will perform upper body dressing (OT) Description: LTG Patient will perform upper body dressing with assist, with/without cues (OT). Flowsheets (Taken 06/11/2024 1306) LTG: Pt will perform upper body dressing with assistance level of: Independent Goal: LTG Patient will perform lower body dressing w/assist (OT) Description: LTG: Patient will perform lower body dressing with assist, with/without cues in positioning using equipment (OT) Flowsheets (Taken 06/11/2024 1306) LTG: Pt will perform lower body dressing with assistance level of: Independent with assistive device   Problem: RH Toileting Goal: LTG Patient will perform toileting task (3/3 steps) with assistance level (OT) Description: LTG: Patient will perform toileting task (3/3 steps) with assistance level (OT)  Flowsheets (Taken 06/11/2024 1306) LTG: Pt will perform toileting task (3/3 steps) with assistance level: Independent  with assistive device   Problem: RH Simple Meal Prep Goal: LTG Patient will perform simple meal prep w/assist (OT) Description: LTG: Patient will perform simple meal prep with assistance, with/without cues (OT). Flowsheets (Taken 06/11/2024 1306) LTG: Pt will perform simple meal prep with assistance level of: Supervision/Verbal cueing   Problem: RH Toilet Transfers Goal: LTG Patient will perform toilet transfers w/assist (OT) Description: LTG: Patient will perform toilet transfers with assist, with/without cues using equipment (OT) Flowsheets (Taken 06/11/2024 1306) LTG: Pt will perform toilet transfers with assistance level of: Independent   Problem: RH Tub/Shower Transfers Goal: LTG Patient will perform tub/shower transfers w/assist (OT) Description: LTG: Patient will perform tub/shower transfers with assist, with/without cues using equipment (OT) Flowsheets (Taken 06/11/2024 1306) LTG: Pt will perform tub/shower stall transfers with assistance level of: Supervision/Verbal cueing LTG: Pt will perform tub/shower transfers from: Walk in shower

## 2024-06-11 NOTE — Discharge Instructions (Addendum)
 Inpatient Rehab Discharge Instructions  Kyle [name] Discharge date and time: 06/21/24   Activities/Precautions/ Functional Status: Activity: no lifting, driving, or strenuous exercise till cleared by MD Diet: low fat, low cholesterol diet Wound Care:    Functional status:  ___ No restrictions      ___ Walk up steps independently ___ 24/7 supervision/assistance   ___ Walk up steps with assistance ___ Intermittent supervision/assistance  ___ Bathe/dress independently ___ Walk with walker     ___ Bathe/dress with assistance ___ Walk Independently     ___ Shower independently ___ Walk with assistance     ___ Shower with assistance _X__ No alcohol      ___ Return to work/school ________   Special Instructions: No weight on left arm.  2. Wear sling when mobile out of bed.   COMMUNITY REFERRALS UPON DISCHARGE:    Home Health:   PT  &   OT - NO SPEECH AVAILABLE IN HIS AREA                 Agency:[facility]   Phone: [phone]     Medical Equipment/Items Ordered:HAS ALL NEEDED EQUIPMENT FROM PREVIOUS ADMISSIONS                                                 Agency/Supplier:NA    My questions have been answered and I understand these instructions. I will adhere to these goals and the provided educational materials after my discharge from the hospital.  Patient/Caregiver Signature _______________________________ Date __________  Clinician Signature _______________________________________ Date __________  Please bring this form and your medication list with you to all your follow-up doctor's appointments. Information on my medicine - ELIQUIS (apixaban)  This medication education was reviewed with me or my healthcare representative as part of my discharge preparation.  Why was Eliquis prescribed for you? Eliquis was prescribed for you to reduce the risk of a blood clot forming that can cause a stroke if you have a medical condition called atrial fibrillation (a  type of irregular heartbeat).  What do You need to know about Eliquis ? Take your Eliquis TWICE DAILY - one tablet in the morning and one tablet in the evening with or without food. If you have difficulty swallowing the tablet whole please discuss with your pharmacist how to take the medication safely.  Take Eliquis exactly as prescribed by your doctor and DO NOT stop taking Eliquis without talking to the doctor who prescribed the medication.  Stopping may increase your risk of developing a stroke.  Refill your prescription before you run out.  After discharge, you should have regular check-up appointments with your healthcare provider that is prescribing your Eliquis.  In the future your dose may need to be changed if your kidney function or weight changes by a significant amount or as you get older.  What do you do if you miss a dose? If you miss a dose, take it as soon as you remember on the same day and resume taking twice daily.  Do not take more than one dose of ELIQUIS at the same time to make up a missed dose.  Important Safety Information A possible side effect of Eliquis is bleeding. You should call your healthcare provider right away if you experience any of the following: Bleeding from an injury or your nose that does  not stop. Unusual colored urine (red or dark brown) or unusual colored stools (red or black). Unusual bruising for unknown reasons. A serious fall or if you hit your head (even if there is no bleeding).  Some medicines may interact with Eliquis and might increase your risk of bleeding or clotting while on Eliquis. To help avoid this, consult your healthcare provider or pharmacist prior to using any new prescription or non-prescription medications, including herbals, vitamins, non-steroidal anti-inflammatory drugs (NSAIDs) and supplements.  This website has more information on Eliquis (apixaban): http://www.eliquis.com/eliquis/home

## 2024-06-11 NOTE — IPOC Note (Addendum)
 Overall Plan of Care [facility]) Patient Details Name: Kyle [name] MRN: [id number] DOB: [birth year]  Admitting Diagnosis: Stroke of right basal ganglia [facility])  Hospital Problems: Principal Problem:   Stroke of right basal ganglia (HCC)     Functional Problem List: Nursing Bladder, Safety, Endurance, Medication Management  PT Balance, Endurance, Motor, Pain, Safety  OT Balance, Vision  SLP Cognition  TR         Basic ADL's: OT Bathing, Dressing, Toileting     Advanced  ADL's: OT Simple Meal Preparation     Transfers: PT Bed Mobility, Bed to Chair, Car  OT Toilet, Tub/Shower     Locomotion: PT Ambulation, Stairs     Additional Impairments: OT None  SLP Social Cognition   Problem Solving, Memory, Attention, Awareness  TR      Anticipated Outcomes Item Anticipated Outcome  Self Feeding independent  Swallowing      Basic self-care  mod I with dressing,  set up bathing  Toileting  mod I   Bathroom Transfers mod I to toilet, supervision to walk in shower  Bowel/Bladder  manage bladder w mod I assist  Transfers  mod I  Locomotion  supervision  Communication     Cognition  modA  Pain  n/a  Safety/Judgment  manage safety w cues   Therapy Plan: PT Intensity: Minimum of 1-2 x/day ,45 to 90 minutes PT Frequency: 5 out of 7 days PT Duration Estimated Length of Stay: 1 week OT Intensity: Minimum of 1-2 x/day, 45 to 90 minutes OT Frequency: 5 out of 7 days OT Duration/Estimated Length of Stay: 7 days SLP Intensity: Minumum of 1-2 x/day, 30 to 90 minutes SLP Frequency: 3 to 5 out of 7 days SLP Duration/Estimated Length of Stay: ~7 days   Team Interventions: Nursing Interventions Patient/Family Education, Bladder Management, Medication Management, Disease Management/Prevention, Discharge Planning  PT interventions Ambulation/gait training, Discharge planning, Functional mobility training, Psychosocial support, Therapeutic Activities, Visual/perceptual  remediation/compensation, Therapeutic Exercise, Wheelchair propulsion/positioning, Skin care/wound management, Neuromuscular re-education, Disease management/prevention, Metallurgist training, Cognitive remediation/compensation, Pain management, DME/adaptive equipment instruction, Splinting/orthotics, UE/LE Strength taining/ROM, UE/LE Coordination activities, Stair training, Patient/family education, Functional electrical stimulation, Community reintegration  OT Interventions Warden/ranger, Discharge planning, Cognitive remediation/compensation, DME/adaptive equipment instruction, Functional mobility training, Patient/family education, Psychosocial support, Self Care/advanced ADL retraining, Therapeutic Activities, Therapeutic Exercise, UE/LE Strength taining/ROM, UE/LE Coordination activities, Visual/perceptual remediation/compensation, Pain management, Neuromuscular re-education  SLP Interventions Cognitive remediation/compensation, Functional tasks, Therapeutic Activities, Internal/external aids, Cueing hierarchy, Environmental controls, Patient/family education  TR Interventions    SW/CM Interventions Discharge Planning, Psychosocial Support, Patient/Family Education   Barriers to Discharge MD  Fracture related pain and NWB  Nursing Decreased caregiver support, Home environment access/layout 1 level 1 ste no rail; family will pull together 24/7 care  PT Decreased caregiver support, Lack of/limited family support, Home environment access/layout, Weight bearing restrictions    OT None    SLP      SW       Team Discharge Planning: Destination: PT-Home ,OT- Home , SLP-Home Projected Follow-up: PT-Home health PT, Outpatient PT, 24 hour supervision/assistance, OT-  Home health OT, SLP-24 hour supervision/assistance, Outpatient SLP, Home Health SLP Projected Equipment Needs: PT-To be determined, OT- None recommended by OT, SLP-None recommended by SLP Equipment Details: PT- ,  OT-  Patient/family involved in discharge planning: PT- Patient,  OT-Patient, SLP-Patient  MD ELOS: 7-10d Medical Rehab Prognosis:  Excellent Assessment: The patient has been admitted for CIR therapies with the diagnosis of right Basal ganglia stroke .  The team will be addressing functional mobility, strength, stamina, balance, safety, adaptive techniques and equipment, self-care, bowel and bladder mgt, patient and caregiver education, pain management . Goals have been set at Mod I/Sup. Anticipated discharge destination is Home .        See Team Conference Notes for weekly updates to the plan of care

## 2024-06-11 NOTE — Progress Notes (Signed)
 PROGRESS NOTE   Subjective/Complaints:  Left clavicular pain , no there concerns  ROS- neg CP , SOB, N/V/D  Objective:   No results found. Recent Labs    06/11/24 0555  WBC 8.3  HGB 12.9*  HCT 37.9*  PLT 251   Recent Labs    06/11/24 0555  NA 135  K 4.1  CL 101  CO2 25  GLUCOSE 117*  BUN 11  CREATININE 0.71  CALCIUM  8.5*    Intake/Output Summary (Last 24 hours) at 06/11/2024 0944 Last data filed at 06/11/2024 0700 Gross per 24 hour  Intake --  Output 1175 ml  Net -1175 ml        Physical Exam: Vital Signs Blood pressure 121/80, pulse 70, temperature 97.8 F (36.6 C), temperature source Oral, resp. rate 18, height 5' 7 (1.702 m), weight 63.7 kg, SpO2 99%.   General: No acute distress Mood and affect are appropriate Heart: Regular rate and rhythm no rubs murmurs or extra sounds Lungs: Clear to auscultation, breathing unlabored, no rales or wheezes Abdomen: Positive bowel sounds, soft nontender to palpation, nondistended Extremities: No clubbing, cyanosis, or edema Skin: No evidence of breakdown, no evidence of rash Neurologic: Cranial nerves II through XII intact, motor strength is 5/5 in RIght deltoid, bicep, tricep, grip,5/5 B hip flexor, knee extensors, ankle dorsiflexor and plantar flexor LUE 5/5 grip , proximal not tested due to clavicular fracture  Sensory exam normal sensation to light touch and proprioception in bilateral upper and lower extremities Cerebellar exam normal finger to nose to finger as well as heel to shin in bilateral upper and lower extremities Musculoskeletal:LUE in sling , good wrist and finger ROM, No swelling in hand wrist, no tenderness in shoulder  No joint swelling   Assessment/Plan: 1. Functional deficits which require 3+ hours per day of interdisciplinary therapy in a comprehensive inpatient rehab setting. Physiatrist is providing close team supervision and 24 hour  management of active medical problems listed below. Physiatrist and rehab team continue to assess barriers to discharge/monitor patient progress toward functional and medical goals  Care Tool:  Bathing              Bathing assist       Upper Body Dressing/Undressing Upper body dressing        Upper body assist      Lower Body Dressing/Undressing Lower body dressing            Lower body assist       Toileting Toileting    Toileting assist Assist for toileting: Moderate Assistance - Patient 50 - 74%     Transfers Chair/bed transfer  Transfers assist     Chair/bed transfer assist level: Minimal Assistance - Patient > 75%     Locomotion Ambulation   Ambulation assist      Assist level: Minimal Assistance - Patient > 75% Assistive device: No Device Max distance: 200'   Walk 10 feet activity   Assist     Assist level: Minimal Assistance - Patient > 75% Assistive device: No Device   Walk 50 feet activity   Assist    Assist level: Minimal Assistance - Patient > 75% Assistive  device: No Device    Walk 150 feet activity   Assist    Assist level: Minimal Assistance - Patient > 75% Assistive device: No Device    Walk 10 feet on uneven surface  activity   Assist     Assist level: Minimal Assistance - Patient > 75%     Wheelchair     Assist Is the patient using a wheelchair?: No             Wheelchair 50 feet with 2 turns activity    Assist            Wheelchair 150 feet activity     Assist          Blood pressure 121/80, pulse 70, temperature 97.8 F (36.6 C), temperature source Oral, resp. rate 18, height 5' 7 (1.702 m), weight 63.7 kg, SpO2 99%.  Medical Problem List and Plan: 1. Functional deficits secondary to R Basal ganglia infarct and R parietal cortex             -patient may  shower             -ELOS/Goals: supervison to min A- 10-14 days             Admit to CIR 2.   Antithrombotics: -DVT/anticoagulation:  Pharmaceutical: Eliquis             -antiplatelet therapy: N/A 3. Pain Management: Tylenol  prn  4. Mood/Behavior/Sleep: LCSW to follow for evaluation and support.              --continue Melatonin at bedtime.              -antipsychotic agents: N/A 5. Neuropsych/cognition: This patient is capable of making decisions on his own behalf. 6. Skin/Wound Care: Routine pressure relief measures.  7. Fluids/Electrolytes/Nutrition: Monitor I/O. Check CMET in am.     Latest Ref Rng & Units 06/11/2024    5:55 AM 06/06/2024    9:07 AM 06/05/2024    7:00 PM  BMP  Glucose 70 - 99 mg/dL 882  97  887   BUN 8 - 23 mg/dL 11  8  13    Creatinine 0.61 - 1.24 mg/dL 9.28  9.34  9.23   Sodium 135 - 145 mmol/L 135  138  137   Potassium 3.5 - 5.1 mmol/L 4.1  3.6  3.9   Chloride 98 - 111 mmol/L 101  103  99   CO2 22 - 32 mmol/L 25  19  24    Calcium  8.9 - 10.3 mg/dL 8.5  8.6  9.4     8.  A fib rate controlled: Monitor HR TID. Now on Eliquis.  9. Thrombocytopenia: Platelets down to 102.  --Recheck in ma. 10. Abnormal LFTs: resolved but alb dropping     Latest Ref Rng & Units 06/11/2024    5:55 AM 06/06/2024    9:07 AM 06/05/2024    7:00 PM  Hepatic Function  Total Protein 6.5 - 8.1 g/dL 5.7  6.3  7.2   Albumin 3.5 - 5.0 g/dL 3.0  3.5  4.2   AST 15 - 41 U/L 17  34  53   ALT 0 - 44 U/L 14  22  23    Alk Phosphatase 38 - 126 U/L 44  40  45   Total Bilirubin 0.0 - 1.2 mg/dL 0.9  1.3  1.8     11.  Mildly comminuted distal left clavicle Fx:  Sling prn for comfort.  12. HTN:  Monitor BP TID--off amlodipine  currently.              --Avoid hypotension.  Vitals:   06/10/24 2019 06/11/24 0513  BP: 120/69 121/80  Pulse: 90 70  Resp: 16 18  Temp: 98.7 F (37.1 C) 97.8 F (36.6 C)  SpO2: 97% 99%     Dyslipidemia: LDL- 127. Now on Lipitor 40 mg daily.  13.  Impaired fasting glucose: Hgb A1C- 5.4.           LOS: 1 days A FACE TO FACE EVALUATION WAS  PERFORMED  Prentice FORBES Compton 06/11/2024, 9:44 AM

## 2024-06-11 NOTE — Plan of Care (Signed)
  Problem: RH Problem Solving Goal: LTG Patient will demonstrate problem solving for (SLP) Description: LTG:  Patient will demonstrate problem solving for basic/complex daily situations with cues  (SLP) Flowsheets (Taken 06/11/2024 1533) LTG: Patient will demonstrate problem solving for (SLP): Basic daily situations LTG Patient will demonstrate problem solving for: Moderate Assistance - Patient 50 - 74%   Problem: RH Memory Goal: LTG Patient will use memory compensatory aids to (SLP) Description: LTG:  Patient will use memory compensatory aids to recall biographical/new, daily complex information with cues (SLP) Flowsheets (Taken 06/11/2024 1533) LTG: Patient will use memory compensatory aids to (SLP): Moderate Assistance - Patient 50 - 74%   Problem: RH Attention Goal: LTG Patient will demonstrate this level of attention during functional activites (SLP) Description: LTG:  Patient will will demonstrate this level of attention during functional activites (SLP) Flowsheets (Taken 06/11/2024 1533) Patient will demonstrate during cognitive/linguistic activities the attention type of: Sustained Patient will demonstrate this level of attention during cognitive/linguistic activities in: Controlled LTG: Patient will demonstrate this level of attention during cognitive/linguistic activities with assistance of (SLP): Moderate Assistance - Patient 50 - 74% Number of minutes patient will demonstrate attention during cognitive/linguistic activities: 10   Problem: RH Awareness Goal: LTG: Patient will demonstrate awareness during functional activites type of (SLP) Description: LTG: Patient will demonstrate awareness during functional activites type of (SLP) Flowsheets (Taken 06/11/2024 1533) Patient will demonstrate during cognitive/linguistic activities awareness type of: Intellectual LTG: Patient will demonstrate awareness during cognitive/linguistic activities with assistance of (SLP): Moderate Assistance  - Patient 50 - 74%

## 2024-06-11 NOTE — Plan of Care (Signed)
  Problem: RH Balance Goal: LTG Patient will maintain dynamic standing balance (PT) Description: LTG:  Patient will maintain dynamic standing balance with assistance during mobility activities (PT) Flowsheets (Taken 06/11/2024 0939) LTG: Pt will maintain dynamic standing balance during mobility activities with:: Independent with assistive device    Problem: Sit to Stand Goal: LTG:  Patient will perform sit to stand with assistance level (PT) Description: LTG:  Patient will perform sit to stand with assistance level (PT) Flowsheets (Taken 06/11/2024 0939) LTG: PT will perform sit to stand in preparation for functional mobility with assistance level: Independent with assistive device   Problem: RH Bed Mobility Goal: LTG Patient will perform bed mobility with assist (PT) Description: LTG: Patient will perform bed mobility with assistance, with/without cues (PT). Flowsheets (Taken 06/11/2024 0939) LTG: Pt will perform bed mobility with assistance level of: Independent with assistive device    Problem: RH Bed to Chair Transfers Goal: LTG Patient will perform bed/chair transfers w/assist (PT) Description: LTG: Patient will perform bed to chair transfers with assistance (PT). Flowsheets (Taken 06/11/2024 0939) LTG: Pt will perform Bed to Chair Transfers with assistance level: Independent with assistive device    Problem: RH Car Transfers Goal: LTG Patient will perform car transfers with assist (PT) Description: LTG: Patient will perform car transfers with assistance (PT). Flowsheets (Taken 06/11/2024 0939) LTG: Pt will perform car transfers with assist:: Supervision/Verbal cueing   Problem: RH Ambulation Goal: LTG Patient will ambulate in controlled environment (PT) Description: LTG: Patient will ambulate in a controlled environment, # of feet with assistance (PT). Flowsheets (Taken 06/11/2024 0939) LTG: Pt will ambulate in controlled environ  assist needed:: Independent with assistive  device LTG: Ambulation distance in controlled environment: 150' Goal: LTG Patient will ambulate in home environment (PT) Description: LTG: Patient will ambulate in home environment, # of feet with assistance (PT). Flowsheets (Taken 06/11/2024 0939) LTG: Pt will ambulate in home environ  assist needed:: Independent with assistive device LTG: Ambulation distance in home environment: 22'

## 2024-06-11 NOTE — Progress Notes (Signed)
 Inpatient Rehabilitation Center Individual Statement of Services  Patient Name:  Kyle [name]  Date:  06/11/2024  Welcome to the Inpatient Rehabilitation Center.  Our goal is to provide you with an individualized program based on your diagnosis and situation, designed to meet your specific needs.  With this comprehensive rehabilitation program, you will be expected to participate in at least 3 hours of rehabilitation therapies Monday-Friday, with modified therapy programming on the weekends.  Your rehabilitation program will include the following services:  Physical Therapy (PT), Occupational Therapy (OT), Speech Therapy (ST), 24 hour per day rehabilitation nursing, Therapeutic Recreaction (TR), Care Coordinator, Rehabilitation Medicine, Nutrition Services, and Pharmacy Services  Weekly team conferences will be held on Wednesday to discuss your progress.  Your Inpatient Rehabilitation Care Coordinator will talk with you frequently to get your input and to update you on team discussions.  Team conferences with you and your family in attendance may also be held.  Expected length of stay: 7 days  Overall anticipated outcome: supervision level  Depending on your progress and recovery, your program may change. Your Inpatient Rehabilitation Care Coordinator will coordinate services and will keep you informed of any changes. Your Inpatient Rehabilitation Care Coordinator's name and contact numbers are listed  below.  The following services may also be recommended but are not provided by the Inpatient Rehabilitation Center:  Driving Evaluations Home Health Rehabiltiation Services Outpatient Rehabilitation Services    Arrangements will be made to provide these services after discharge if needed.  Arrangements include referral to agencies that provide these services.  Your insurance has been verified to be:  [facility] Medicare Your primary doctor is:  Geologist, engineering  Pertinent information will be shared  with your doctor and your insurance company.  Inpatient Rehabilitation Care Coordinator:  Rhoda Clement, KEN [phone] or ELIGAH BASQUES  Information discussed with and copy given to patient by: Clement Asberry MATSU, 06/11/2024, 9:15 AM

## 2024-06-11 NOTE — Patient Care Conference (Signed)
 Inpatient RehabilitationTeam Conference and Plan of Care Update Date: 06/11/2024   Time: 10:22 AM    Patient Name: Kyle [name]      Medical Record Number: [id number]  Date of Birth: 10/07/1934 Sex: Male         Room/Bed: 4W03C/4W03C-01 Payor Info: Payor: Advertising copywriter MEDICARE / Plan: [facility] MEDICARE / Product Type: *No Product type* /    Admit Date/Time:  06/10/2024  2:33 PM  Primary Diagnosis:  Stroke of right basal ganglia [facility])  Hospital Problems: Principal Problem:   Stroke of right basal ganglia [facility])    Expected Discharge Date: Expected Discharge Date: 06/17/24 ([place] Pending)  Team Members Present: Physician leading conference: Dr. Prentice Compton Social Worker Present: Rhoda Clement, LCSW Nurse Present: Barnie Ronde, RN PT Present: Sherlean Perks, PT;Dominic Freddi, PTA OT Present: Recardo Maxwell, OT     Current Status/Progress Goal Weekly Team Focus  Bowel/Bladder   Continent B/B  LBM 09/30   Will remain continent with normal pattern   Assist with toilet needs qshift/prn    Swallow/Nutrition/ Hydration               ADL's                Mobility   minA bed mobility, CGA/minA transfers, CGA/minA gait 200' with no AD, CGA/minA x12 stairs using 1 hand rail, minA car transfer   mod I  compliance with NWB LUE restrictions, dynamic standing balance, dynamic gait training, LRAD (cane?) training if needed, family training/education    Communication                Safety/Cognition/ Behavioral Observations               Pain   Denies pain at this time   Will be free from pain   Assess pain qshift/prn    Skin   Abrasion to left elbow.(foamin place)   Will maintain skin intergrity with no breakdown  Assess skin qshift/prn and provide education to prevent breakdown      Discharge Planning:  New evaluation pt was living alone prior to admission will need to confirm with family if able to provide assist at discharge   Team  Discussion: Patient post right basal ganglia CVA with left clavicle fracture post fall with sling on arm. Continued Eliquis for A-fib. Limited by his attention and safety awareness is poor; using a telesitter is a good bet for now with poor insight and sundowning.   Patient on target to meet rehab goals: yes, currently needs CGA for bed mobility and once up, able to ambulate up to 150' without an assistive device with CGA. Can manage steps CGA - min assist. Needs supervision for self care except for buttons, stood at sink for 10-15 min for shaving and oral care. On visual assessment, tracking was poor and he seemed to walk Past things when cued to move to a particular chair.  *See Care Plan and progress notes for long and short-term goals.   Revisions to Treatment Plan:  N/a   Teaching Needs: Safety, medications, dietary modification, transfers, toileting, etc.   Current Barriers to Discharge: Decreased caregiver support, Home enviroment access/layout, and Behavior  Possible Resolutions to Barriers: Family education HH follow up services     Medical Summary Current Status: Left clavicular pain, No severe weakness noted  Barriers to Discharge: Medical stability;Uncontrolled Pain   Possible Resolutions to [employer] Focus: medication adjustment for Left clavicular pain ,   Continued Need for Acute  Rehabilitation Level of Care: The patient requires daily medical management by a physician with specialized training in physical medicine and rehabilitation for the following reasons: Direction of a multidisciplinary physical rehabilitation program to maximize functional independence : Yes Medical management of patient stability for increased activity during participation in an intensive rehabilitation regime.: Yes Analysis of laboratory values and/or radiology reports with any subsequent need for medication adjustment and/or medical intervention. : Yes   I attest that I was present,  lead the team conference, and concur with the assessment and plan of the team.   Fredericka Sober B 06/11/2024, 2:48 PM

## 2024-06-11 NOTE — Evaluation (Signed)
 Physical Therapy Assessment and Plan  Patient Details  Name: Kyle [name] MRN: [id number] Date of Birth: 05-02-1935  PT Diagnosis: Abnormal posture, Abnormality of gait, Cognitive deficits, Difficulty walking, Impaired cognition, and Muscle weakness Rehab Potential: Excellent ELOS: 1 week   Today's Date: 06/11/2024 PT Individual Time: 9154-9043 PT Individual Time Calculation (min): 71 min    Hospital Problem: Principal Problem:   Stroke of right basal ganglia [facility])   Past Medical History:  Past Medical History:  Diagnosis Date   Atrial fibrillation (HCC)    Post operative, maintaining normal sinus rhythm on amiodarone   Benign prostatic hypertrophy    CAD (coronary artery disease)    Non obstructive by catheterization Feb 2007 prior to his surgery revealing a 40-50% LAD lesion, good LV function with EF 55-60% by last echocardiogram prior to his surgery   Hyperlipidemia    Treated   Mitral regurgitation    S/P mitral valve repair Oct 17, 2005 by Dr. Dusty   Skin cancer    Past Surgical History:  Past Surgical History:  Procedure Laterality Date   CARDIAC CATHETERIZATION  10/2005   COLONOSCOPY     MITRAL VALVE REPAIR  10/17/2005   Dr. Dusty   POLYPECTOMY     SKIN CANCER EXCISION Left    cheek    Assessment & Plan Clinical Impression: Patient is a 89 year old male with history of BPH, non-obst CAD s/p MVR, colon polyps, post op A fib who was admitted on 06/05/24 after found down with mental status changes, left facial droop and left shoulder pain. Family had seen patient few hours earlier. WBC elevated at 13.4 and Sepsis work up w/respiratory panel negative. He was found to have left transverse non-displaced distal clavicle Fx and acute infarct in right basal ganglia with associated petechial hemorrhage as well as small acute embolic infarcts in right parietal cortex. CTA negative for LVO.  2 D echo showed EF 60-65% with nor wall abnormality and annuloplasty ring in mitral  position. He was noted to be in A fib at admission and stroke felt to be embolic in setting of A fib.    AC held due to petechial hemorrhage. Sling ordered for clavicle Fx and made NWB LUE.  As neurologically stable, ASA changed to Eliquis on 09/28 and Lipitor increased to 40 mg due to LDL-127. He continues to have mild left sided weakness with facial droop and right gaze preference.  PT/OT consulted and patient noted to have mild confusion with difficulty following multi-step commands, has ataxic gait and requires min assist with ADLs and mobility. He was independent and driving PTA. CIR recommended due to functional decline. Patient transferred to CIR on 06/10/2024 .   Patient currently requires min with mobility secondary to muscle weakness and muscle joint tightness, decreased cardiorespiratoy endurance, decreased problem solving and decreased safety awareness, and decreased standing balance, decreased postural control, and decreased balance strategies.  Prior to hospitalization, patient was independent  with mobility and lived with Alone in a House home.  Home access is  Ramped entrance.  Patient will benefit from skilled PT intervention to maximize safe functional mobility, minimize fall risk, and decrease caregiver burden for planned discharge home with intermittent assist.  Anticipate patient will benefit from follow up OP at discharge.  PT - End of Session Activity Tolerance: Tolerates < 10 min activity, no significant change in vital signs Endurance Deficit: Yes PT Assessment Rehab Potential (ACUTE/IP ONLY): Excellent PT Barriers to Discharge: Decreased caregiver support;Lack of/limited family  support;Home environment access/layout;Weight bearing restrictions PT Patient demonstrates impairments in the following area(s): Balance;Endurance;Motor;Pain;Safety PT Transfers Functional Problem(s): Bed Mobility;Bed to Chair;Car PT Locomotion Functional Problem(s): Ambulation;Stairs PT Plan PT  Intensity: Minimum of 1-2 x/day ,45 to 90 minutes PT Frequency: 5 out of 7 days PT Duration Estimated Length of Stay: 1 week PT Treatment/Interventions: Ambulation/gait training;Discharge planning;Functional mobility training;Psychosocial support;Therapeutic Activities;Visual/perceptual remediation/compensation;Therapeutic Exercise;Wheelchair propulsion/positioning;Skin care/wound management;Neuromuscular re-education;Disease management/prevention;Balance/vestibular training;Cognitive remediation/compensation;Pain management;DME/adaptive equipment instruction;Splinting/orthotics;UE/LE Strength taining/ROM;UE/LE Coordination activities;Stair training;Patient/family education;Functional electrical stimulation;Community reintegration PT Transfers Anticipated Outcome(s): mod I PT Locomotion Anticipated Outcome(s): supervision PT Recommendation Recommendations for Other Services: Neuropsych consult Follow Up Recommendations: Home health PT;Outpatient PT;24 hour supervision/assistance Patient destination: Home Equipment Recommended: To be determined   PT Evaluation Precautions/Restrictions Precautions Precautions: Fall Restrictions Weight Bearing Restrictions Per Provider Order: Yes LUE Weight Bearing Per Provider Order: Non weight bearing Other Position/Activity Restrictions: sling Pain Interference Pain Interference Pain Effect on Sleep: 3. Frequently Pain Interference with Therapy Activities: 3. Frequently Pain Interference with Day-to-Day Activities: 3. Frequently Home Living/Prior Functioning Home Living Living Arrangements: Alone Available Help at Discharge: Available PRN/intermittently (need to confirm level of assist from family. 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 Does his own yard work. Vision/Perception  Vision - History Ability to See in Adequate Light: 0 Adequate Vision - Assessment Eye Alignment: Within Functional Limits Perception Perception: Within Functional Limits Praxis Praxis: WFL  Cognition Overall Cognitive Status: No family/caregiver present to determine baseline cognitive functioning Arousal/Alertness: Awake/alert Orientation Level: Oriented to person;Oriented to place;Oriented to situation Sensation Sensation Light Touch: Appears Intact Hot/Cold: Appears Intact Proprioception: Impaired by gross assessment Stereognosis: Not tested Coordination Gross Motor Movements are Fluid and Coordinated: No Fine Motor Movements are Fluid and Coordinated: No Motor  Motor Motor: Other (comment) Motor - Skilled Clinical Observations: Generalized deconditoning and pain from clavicle fx   Trunk/Postural Assessment  Cervical Assessment Cervical Assessment: Exceptions to [facility] (forward head) Thoracic Assessment Thoracic Assessment: Exceptions to [facility] (B shlder elevation, rounded) Lumbar Assessment Lumbar Assessment: Exceptions to [facility] (tilted posteriorly) Postural Control Postural Control: Deficits on evaluation Trunk Control: Posterior bias - mild - able to self correct  Balance Balance Balance Assessed: Yes Static Sitting Balance Static Sitting - Balance Support: Feet supported;No upper extremity supported Static Sitting - Level of Assistance: 7: Independent Dynamic Sitting Balance Dynamic Sitting - Balance Support: Feet supported;No upper extremity supported Dynamic Sitting - Level of Assistance: 5: Stand by assistance Static Standing Balance Static Standing - Balance Support: No upper extremity  supported Static Standing - Level of Assistance: 5: Stand by assistance Dynamic Standing Balance Dynamic Standing - Balance Support: No upper extremity supported;During functional activity Dynamic Standing - Level of Assistance: 4: Min assist Extremity Assessment      RLE Assessment RLE Assessment: Within Functional Limits LLE Assessment LLE Assessment: Exceptions to [facility] General Strength Comments: Grossly 4/5  Care Tool Care Tool Bed Mobility Roll left and right activity   Roll left and right assist level: Minimal Assistance - Patient > 75%    Sit to lying activity   Sit to lying assist level: Minimal Assistance - Patient > 75%    Lying to sitting  on side of bed activity   Lying to sitting on side of bed assist level: the ability to move from lying on the back to sitting on the side of the bed with no back support.: Minimal Assistance - Patient > 75%     Care Tool Transfers Sit to stand transfer   Sit to stand assist level: Minimal Assistance - Patient > 75%    Chair/bed transfer   Chair/bed transfer assist level: Minimal Assistance - Patient > 75%    Car transfer   Car transfer assist level: Minimal Assistance - Patient > 75%      Care Tool Locomotion Ambulation   Assist level: Minimal Assistance - Patient > 75% Assistive device: No Device Max distance: 200'  Walk 10 feet activity   Assist level: Minimal Assistance - Patient > 75% Assistive device: No Device   Walk 50 feet with 2 turns activity   Assist level: Minimal Assistance - Patient > 75% Assistive device: No Device  Walk 150 feet activity   Assist level: Minimal Assistance - Patient > 75% Assistive device: No Device  Walk 10 feet on uneven surfaces activity   Assist level: Minimal Assistance - Patient > 75%    Stairs   Assist level: Minimal Assistance - Patient > 75% Stairs assistive device: 1 hand rail Max number of stairs: 12  Walk up/down 1 step activity   Walk up/down 1 step (curb) assist level:  Minimal Assistance - Patient > 75% Walk up/down 1 step or curb assistive device: 1 hand rail  Walk up/down 4 steps activity   Walk up/down 4 steps assist level: Minimal Assistance - Patient > 75% Walk up/down 4 steps assistive device: 1 hand rail  Walk up/down 12 steps activity   Walk up/down 12 steps assist level: Minimal Assistance - Patient > 75% Walk up/down 12 steps assistive device: 1 hand rail  Pick up small objects from floor   Pick up small object from the floor assist level: Minimal Assistance - Patient > 75%    Wheelchair Is the patient using a wheelchair?: No          Wheel 50 feet with 2 turns activity      Wheel 150 feet activity        Refer to Care Plan for Long Term Goals  SHORT TERM GOAL WEEK 1 PT Short Term Goal 1 (Week 1): STG = LTG due to ELOS  Recommendations for other services: Neuropsych  Skilled Therapeutic Intervention Mobility Bed Mobility Bed Mobility: Supine to Sit;Sit to Supine Supine to Sit: Minimal Assistance - Patient > 75% Sit to Supine: Minimal Assistance - Patient > 75% Transfers Transfers: Sit to Stand;Stand to Sit;Stand Pivot Transfers Sit to Stand: Minimal Assistance - Patient > 75% Stand to Sit: Minimal Assistance - Patient > 75% Stand Pivot Transfers: Minimal Assistance - Patient > 75% Stand Pivot Transfer Details: Tactile cues for initiation;Verbal cues for gait pattern;Verbal cues for technique;Verbal cues for sequencing;Verbal cues for precautions/safety;Tactile cues for posture Transfer (Assistive device): None Locomotion  Gait Ambulation: Yes Gait Assistance: Contact Guard/Touching assist;Minimal Assistance - Patient > 75% Gait Distance (Feet): 200 Feet Assistive device: None Gait Assistance Details: Verbal cues for gait pattern;Verbal cues for safe use of DME/AE;Tactile cues for posture;Verbal cues for precautions/safety Gait Gait: Yes Gait Pattern: Impaired Gait Pattern: Wide base of support;Right flexed knee in  stance;Left flexed knee in stance;Decreased stride length;Trunk flexed Stairs / Additional Locomotion Stairs: Yes Stairs Assistance: Minimal Assistance - Patient > 75% Stair  Management Technique: One rail Left;Forwards;Alternating pattern Number of Stairs: 12 Height of Stairs: 6 Wheelchair Mobility Wheelchair Mobility: No  Treatment: Pt in bed to start - agreeable to PT evaluation. Pt reports no pain, but does have intermittent R shoulder pain when he moves. Reminded patient on precautions for NWB and using the sling in LUE to help with support and stability during mobility. Pt needing frequent reminders during treatment to avoid using LUE to comply with restrictions.   Bed mobility needing minA for trunk support to get to EOB. Cues for avoiding pushing through LLE to help with scooting and repositioning in bed.   Pt completes sit<>stand and stand pivot transfers with CGA/minA without an AD - assist primarily for general stability and balance during transfers.   Gait training 200' + 150' + 150' ' with CGA/minA and no AD - general cues for awareness, safety, and mindfulness of his environment.   Car transfer training completed at minA level for balance while he enters the car. Able to manage BLE in/out of vehicle without assistance.   Completed stair training using 6 steps and 1 HR on his R - completed with minA with a alternating pattern for ascent and step-to pattern for descent.   Pt finished session on Nustep x 11.5 minutes at L4 resistance ,using BLE only to challenge strengthening and endurance. Pt completed a total of 660 steps and 0.37 miles.   Pt returned to his room - was left sitting up in w/c with seat belt alarm on and his call bell in reach. LUE supported with pillow.    Discharge Criteria: Patient will be discharged from PT if patient refuses treatment 3 consecutive times without medical reason, if treatment goals not met, if there is a change in medical status, if patient  makes no progress towards goals or if patient is discharged from hospital.  The above assessment, treatment plan, treatment alternatives and goals were discussed and mutually agreed upon: by patient  Sherlean SHAUNNA Perks  PT, DPT, CSRS  06/11/2024, 9:37 AM

## 2024-06-11 NOTE — Evaluation (Signed)
 Speech Language Pathology Assessment and Plan  Patient Details  Name: Kyle [name] MRN: [id number] Date of Birth: 1935-02-06  SLP Diagnosis: Cognitive Impairments  Rehab Potential: Fair ELOS: ~7 days    Today's Date: 06/11/2024 SLP Individual Time: 1330-1430 SLP Individual Time Calculation (min): 60 min   Hospital Problem: Principal Problem:   Stroke of right basal ganglia [facility])  Past Medical History:  Past Medical History:  Diagnosis Date   Atrial fibrillation (HCC)    Post operative, maintaining normal sinus rhythm on amiodarone   Benign prostatic hypertrophy    CAD (coronary artery disease)    Non obstructive by catheterization Feb 2007 prior to his surgery revealing a 40-50% LAD lesion, good LV function with EF 55-60% by last echocardiogram prior to his surgery   Hyperlipidemia    Treated   Mitral regurgitation    S/P mitral valve repair Oct 17, 2005 by Dr. Dusty   Skin cancer    Past Surgical History:  Past Surgical History:  Procedure Laterality Date   CARDIAC CATHETERIZATION  10/2005   COLONOSCOPY     MITRAL VALVE REPAIR  10/17/2005   Dr. Dusty   POLYPECTOMY     SKIN CANCER EXCISION Left    cheek    Assessment / Plan / Recommendation Clinical Impression  Pt is an 89 year old male with medical hx significant for: BPH, CAD, HTN, HLD, mitral valve repair (2006), CVA (2007). Pt [facility] to [facility] on 06/05[facility]24 d/t fall. Pt reported pain in area below right ribs. Imaging revealed acute/subacute right basal ganglia infarct. Left shoulder x-ray showed nondisplaced fracture of left distal clavicle. Placed in sling immobilizer. Neurology consulted. EKG showed A-fib and right bundle blanch block. CTA negative for LVO. MRI showed acute infarct in right basal ganglia with associated petechial hemorrhage. Also showed small acute embolic infarcts in right parietal cortex. CT C-spine negative for acute findings. CT chest/abdomen/pelvis negative for acute findings. Therapy  evaluations completed and CIR recommended d/t pt's deficits in functional mobility.   Cognitive-Linguistic Evaluation: Patient presents with severe impairments in the areas of sustained attention and safety awareness. During cognitive screening administration, exhibited severe deficits in the areas of registration, repetition, construction ability, and similarities and mild cognitive deficits in confrontational naming, short term memory, and calculations. At baseline, patient expressed difficulty with recalling familiar names. Although patient presents with short term memory deficits, long term memory appears intact. Patient states no changes in overall cognitive status post-stroke, however, family not present to confirm cognitive baseline. Suspect cognitive deficits are new given patient reports being responsible for iADLs at baseline. Note patient exhibits significant levels of inattention/distractibility session requiring max to total assist to attend to tasks. Patient attests to difficulty with inattention prior to admission. Additionally, patient exhibits poor awareness of cognitive deficits in regard to safety and reports that he would be safe should he attempt to get up without assistance. No receptive/expressive language deficits noted. Patient exhibited left facial droop and speech clarity was reduced at the conversation level, though intelligibility maintained throughout. Recommend continuation of SLP services to target aforementioned deficits. Patient was left in chair with call bell in reach and chair alarm set. SLP will continue to target goals per plan of care.     Skilled Therapeutic Interventions          SLP conducted skilled evaluation session to assess cognitive-linguistic function. Utilized Cognistat and patient and/or family interview. Full results above.   SLP Assessment  Patient will need skilled Speech Lanaguage  Pathology Services during CIR admission    Recommendations  Oral Care  Recommendations: Oral care BID Recommendations for Other Services: Neuropsych consult Patient destination: Home Follow up Recommendations: 24 hour supervision/assistance;Outpatient SLP;Home Health SLP Equipment Recommended: None recommended by SLP    SLP Frequency 3 to 5 out of 7 days   SLP Duration  SLP Intensity  SLP Treatment/Interventions ~7 days  Minumum of 1-2 x/day, 30 to 90 minutes  Cognitive remediation/compensation;Functional tasks;Therapeutic Activities;Internal/external aids;Cueing hierarchy;Environmental controls;Patient/family education    Pain Pain Assessment Pain Scale: Faces Pain Score: 0-No pain Faces Pain Scale: No hurt  Prior Functioning Cognitive/Linguistic Baseline: Within functional limits Type of Home: House  Lives With: Alone Available Help at Discharge: Available PRN/intermittently Education: 10th grade Vocation: Retired  SLP Evaluation Cognition Overall Cognitive Status: No family/caregiver present to determine baseline cognitive functioning Arousal/Alertness: Awake/alert Orientation Level: Oriented to person;Oriented to place;Oriented to time;Disoriented to situation Year: 2025 Month: October Day of Week: Correct Attention: Sustained Sustained Attention: Impaired Sustained Attention Impairment: Verbal basic;Functional basic Memory: Impaired Memory Impairment: Decreased recall of new information;Storage deficit Awareness: Impaired Awareness Impairment: Intellectual impairment;Emergent impairment;Anticipatory impairment Problem Solving: Impaired Problem Solving Impairment: Verbal basic;Functional basic Behaviors:  (tangential) Safety/Judgment: Impaired  Comprehension Auditory Comprehension Overall Auditory Comprehension: Appears within functional limits for tasks assessed Expression Expression Primary Mode of Expression: Verbal Verbal Expression Overall Verbal Expression: Appears within functional limits for tasks assessed Oral  Motor Oral Motor/Sensory Function Overall Oral Motor/Sensory Function: Mild impairment Facial ROM: Reduced left Facial Symmetry: Abnormal symmetry left Facial Strength: Reduced left Facial Sensation: Within Functional Limits Lingual ROM: Within Functional Limits Lingual Symmetry: Within Functional Limits Lingual Strength: Reduced Lingual Sensation: Within Functional Limits Velum: Within Functional Limits Mandible: Within Functional Limits Motor Speech Overall Motor Speech: Impaired Respiration: Within functional limits Phonation: Normal Resonance: Within functional limits Articulation: Impaired Level of Impairment: Conversation Intelligibility: Intelligible Motor Planning: Within functional limits Motor Speech Errors: Not applicable Effective Techniques: Over-articulate  Care Tool Care Tool Cognition Ability to hear (with hearing aid or hearing appliances if normally used Ability to hear (with hearing aid or hearing appliances if normally used): 0. Adequate - no difficulty in normal conservation, social interaction, listening to TV   Expression of Ideas and Wants Expression of Ideas and Wants: 4. Without difficulty (complex and basic) - expresses complex messages without difficulty and with speech that is clear and easy to understand   Understanding Verbal and Non-Verbal Content Understanding Verbal and Non-Verbal Content: 4. Understands (complex and basic) - clear comprehension without cues or repetitions  Memory/Recall Ability Memory/Recall Ability : Current season;That he or she is in a hospital/hospital unit   Intelligibility: Intelligible  Short Term Goals: Week 1: SLP Short Term Goal 1 (Week 1): STGs=LTGs d/t ELOS  Refer to Care Plan for Long Term Goals  Recommendations for other services: Neuropsych  Discharge Criteria: Patient will be discharged from SLP if patient refuses treatment 3 consecutive times without medical reason, if treatment goals not met, if there is  a change in medical status, if patient makes no progress towards goals or if patient is discharged from hospital.  The above assessment, treatment plan, treatment alternatives and goals were discussed and mutually agreed upon: by patient  Depaul Arizpe A Shantina Chronister 06/11/2024, 3:43 PM

## 2024-06-11 NOTE — Plan of Care (Signed)
  Problem: Consults Goal: RH STROKE PATIENT EDUCATION Description: See Patient Education module for education specifics  Outcome: Progressing   Problem: RH BLADDER ELIMINATION Goal: RH STG MANAGE BLADDER WITH ASSISTANCE Description: STG Manage Bladder With mod I Assistance Outcome: Progressing Goal: RH STG MANAGE BLADDER WITH MEDICATION WITH ASSISTANCE Description: STG Manage Bladder With Medication With Assistance. Outcome: Progressing   Problem: RH KNOWLEDGE DEFICIT Goal: RH STG INCREASE KNOWLEDGE OF HYPERTENSION Description: Patient and family will be able to manage HTN using educational resources for medication and dietary modification independently Outcome: Progressing Goal: RH STG INCREASE KNOWLEGDE OF HYPERLIPIDEMIA Description: Patient and family will be able to manage HLD using educational resources for medication and dietary modification independently Outcome: Progressing Goal: RH STG INCREASE KNOWLEDGE OF STROKE PROPHYLAXIS Description: Patient and family will be able to manage secondary risk using educational resources for medication and dietary modification independently Outcome: Progressing

## 2024-06-11 NOTE — Progress Notes (Signed)
 Inpatient Rehabilitation  Patient information reviewed and entered into eRehab system by [facility]. Karen Kays., CCC/SLP, PPS Coordinator.  Information including medical coding, functional ability and quality indicators will be reviewed and updated through discharge.

## 2024-06-11 NOTE — Progress Notes (Signed)
 Inpatient Rehabilitation Care Coordinator Assessment and Plan Patient Details  Name: Kyle [name] MRN: [id number] Date of Birth: [birth year]  Today's Date: 06/11/2024  Hospital Problems: Principal Problem:   Stroke of right basal ganglia [facility])  Past Medical History:  Past Medical History:  Diagnosis Date   Atrial fibrillation [facility])    Post operative, maintaining normal sinus rhythm on amiodarone   Benign prostatic hypertrophy    CAD (coronary artery disease)    Non obstructive by catheterization Feb 2007 prior to his surgery revealing a 40-50% LAD lesion, good LV function with EF 55-60% by last echocardiogram prior to his surgery   Hyperlipidemia    Treated   Mitral regurgitation    S/P mitral valve repair Oct 17, 2005 by Dr. Dusty   Skin cancer    Past Surgical History:  Past Surgical History:  Procedure Laterality Date   CARDIAC CATHETERIZATION  10/2005   COLONOSCOPY     MITRAL VALVE REPAIR  10/17/2005   Dr. Dusty   POLYPECTOMY     SKIN CANCER EXCISION Left    cheek   Social History:  reports that he has quit smoking. His smoking use included cigarettes. He has never used smokeless tobacco. He reports that he does not drink alcohol and does not use drugs.  Family / Support Systems Marital Status: Widow/Widower Patient Roles: Parent, Other (Comment) (retiree) Children: [name]-daughter [phone]  Lawernce [phone] Other Supports: Tommy-brother [phone] Anticipated Caregiver: Daughter and other family members Ability/Limitations of Caregiver: Can only provide assist short time Caregiver Availability: 24/7 (short period of time) Family Dynamics: Close with children and step-children he cared for wife for 11 years she was a paraplegic until she died 2 years ago. He still misses her. He was very active and independent prior to admission  Social History Preferred language: English Religion: Baptist Cultural Background: NA Education: HS Health Literacy - How often do you need  to have someone help you when you read instructions, pamphlets, or other written material from your doctor or pharmacy?: Never Writes: Yes Employment Status: Retired Marine scientist Issues: NA Guardian/Conservator: None-according to MD pt is capable of making her own decisions while here   Abuse/Neglect Abuse/Neglect Assessment Can Be Completed: Yes Physical Abuse: Denies Verbal Abuse: Denies Sexual Abuse: Denies Exploitation of patient/patient's resources: Denies Self-Neglect: Denies  Patient response to: Social Isolation - How often do you feel lonely or isolated from those around you?: Never  Emotional Status Pt's affect, behavior and adjustment status: Pt is motivated to do well and get back home. He has always been very independent and taken care of others. He is not one to be the patient and does not like it. He has a Cabin crew in his room and he is aware he should not get up by himself but it is normal for him to move a lot, according to him Recent Psychosocial Issues: other health issues-when fell fractured his collarbone so arm is in a sling Psychiatric History: None-he seems to be coping appropriately and in good spirits. Substance Abuse History: NA  Patient / Family Perceptions, Expectations & Goals Pt/Family understanding of illness & functional limitations: Pt is motivated to do well and recover he realizes his arm in a sling will hamper him. He does talk with the MD's involved and feels he understands his plan going forward. Premorbid pt/family roles/activities: father, step-dad, retiree, home owner, church member, sibling, etc Anticipated changes in roles/activities/participation: resume Pt/family expectations/goals: Pt states:  I want to take care of myself like  I always have. I know my arm will hamper me but I have fractured my elbow before and figured it out.  Community Resources [employer]: None Premorbid Home Care/DME Agencies:  None Transportation available at discharge: family Is the patient able to respond to transportation needs?: Yes In the past 12 months, has lack of transportation kept you from medical appointments or from getting medications?: No In the past 12 months, has lack of transportation kept you from meetings, work, or from getting things needed for daily living?: No  Discharge Planning Living Arrangements: Alone Support Systems: Children, Other relatives, Friends/neighbors Type of Residence: Private residence Insurance Resources: Media planner (specify) ([employer] Medicare) Financial Resources: Restaurant manager, fast food Screen Referred: No Living Expenses: Own Money Management: Patient Does the patient have any problems obtaining your medications?: No Home Management: self Patient/Family Preliminary Plans: Return home with family coming in for a short time. Will need to confirm for how loing they can assist pt. he will need some assist due to his fractutred collarbone. Will await team's evaluations and work on discharge needs. Care Coordinator Anticipated Follow Up Needs: HH/OP  Clinical Impression Pleasant gentleman who is very active and independent and used to doing for himself. His family is involved and am awaiting return call from daughter regarding how much assist they can provide. Await team's evaluations  [name] MATSU 06/11/2024, 9:13 AM

## 2024-06-12 ENCOUNTER — Other Ambulatory Visit (HOSPITAL_COMMUNITY): Payer: Self-pay

## 2024-06-12 ENCOUNTER — Telehealth (HOSPITAL_COMMUNITY): Payer: Self-pay | Admitting: Pharmacy Technician

## 2024-06-12 DIAGNOSIS — I6381 Other cerebral infarction due to occlusion or stenosis of small artery: Secondary | ICD-10-CM | POA: Diagnosis not present

## 2024-06-12 LAB — CBC
HCT: 35.7 % — ABNORMAL LOW (ref 39.0–52.0)
Hemoglobin: 12.4 g/dL — ABNORMAL LOW (ref 13.0–17.0)
MCH: 31.2 pg (ref 26.0–34.0)
MCHC: 34.7 g/dL (ref 30.0–36.0)
MCV: 89.7 fL (ref 80.0–100.0)
Platelets: 195 K/uL (ref 150–400)
RBC: 3.98 MIL/uL — ABNORMAL LOW (ref 4.22–5.81)
RDW: 13.2 % (ref 11.5–15.5)
WBC: 6.5 K/uL (ref 4.0–10.5)
nRBC: 0 % (ref 0.0–0.2)

## 2024-06-12 LAB — BASIC METABOLIC PANEL WITH GFR
Anion gap: 8 (ref 5–15)
BUN: 11 mg/dL (ref 8–23)
CO2: 26 mmol/L (ref 22–32)
Calcium: 8.8 mg/dL — ABNORMAL LOW (ref 8.9–10.3)
Chloride: 100 mmol/L (ref 98–111)
Creatinine, Ser: 0.71 mg/dL (ref 0.61–1.24)
GFR, Estimated: >60 mL/min (ref >60)
Glucose, Bld: 128 mg/dL — ABNORMAL HIGH (ref 70–99)
Potassium: 4.5 mmol/L (ref 3.5–5.1)
Sodium: 134 mmol/L — ABNORMAL LOW (ref 135–145)

## 2024-06-12 NOTE — Plan of Care (Signed)
  Problem: RH Attention Goal: LTG Patient will demonstrate this level of attention during functional activites (OT) Description: LTG:  Patient will demonstrate this level of attention during functional activites  (OT) Flowsheets (Taken 06/12/2024 1719) Patient will demonstrate this level of attention during functional activites: Divided Patient will demonstrate above attention level in the following environment: Home LTG: Patient will demonstrate this level of attention during functional activites (OT): Supervision   Problem: RH Awareness Goal: LTG: Patient will demonstrate awareness during functional activites type of (OT) Description: LTG: Patient will demonstrate awareness during functional activites type of (OT) Flowsheets (Taken 06/12/2024 1719) Patient will demonstrate awareness during functional activites type of: Anticipatory LTG: Patient will demonstrate awareness during functional activites type of (OT): Supervision

## 2024-06-12 NOTE — Telephone Encounter (Signed)
 Patient Product/process development scientist completed.    The patient is insured through [facility]. Patient has Medicare and is not eligible for a copay card, but may be able to apply for patient assistance or Medicare RX Payment Plan (Patient Must reach out to their plan, if eligible for payment plan), if available.    Ran test claim for Eliquis 5 mg and the current 30 day co-pay is $302.00 due to a $255.00 deductible will be $47.00 once deductible is met.   This test claim was processed through [facility]- copay amounts may vary at other pharmacies due to pharmacy/plan contracts, or as the patient moves through the different stages of their insurance plan.     Reyes Sharps, CPHT Pharmacy Technician III Certified Patient Advocate [facility] Patient Advocate Team Direct Number: [phone]  Fax: [phone]

## 2024-06-12 NOTE — Progress Notes (Signed)
 Occupational Therapy Session Note  Patient Details  Name: Kyle [name] MRN: [id number] Date of Birth: [birth year]  Today's Date: 06/12/2024 OT Individual Time: 9044-8944 OT Individual Time Calculation (min): 60 min    Short Term Goals: Week 1:  OT Short Term Goal 1 (Week 1): STGs = LTGs  Skilled Therapeutic Interventions/Progress Updates:   Pt greeted seated on toilet with NT present, pt agreeable to OT intervention.      Transfers/bed mobility/functional mobility: pt completed functional ambulation with no AD with MIN HHA on R side.   Therapeutic activity:  Pt completed dual processing task with pt instructed to read questions pertaining to map on opposite side of gym and ambulate over obstacles to determine answer to questions based on map on wall. Pt needed MIN A to navigate over obstacles with MIN HHA on R side. One LOB needing MODA to recover. Pt needed max cues to complete task with pt demonstrating deficits with problem solving, memory and attention. Pts daughter present during session reporting that this is a task pt could have completed prior to his stroke.   ADLs:  LB dressing: donned pants from recliner with MIN A mostly to manage L side.   Transfers: ambulatory ADL transfers with no AD and MIN HHA.  Toileting: pt with continent b/b void needing MINA for 3/3 toileting tasks.    Ended session with pt seated in recliner with all needs within reach.  Precautions:  Precautions Precautions: Fall Restrictions Weight Bearing Restrictions Per Provider Order: Yes LUE Weight Bearing Per Provider Order: Non weight bearing Other Position/Activity Restrictions: sling  Pain: No pain   Therapy/Group: Individual Therapy  [name] 06/12/2024, 12:11 PM

## 2024-06-12 NOTE — Progress Notes (Signed)
 Physical Therapy Session Note  Patient Details  Name: Kyle [name] MRN: [id number] Date of Birth: 1935/05/08  Today's Date: 06/12/2024 PT Individual Time: 0820-0900; 1119 - 1159; 1303 - 1345 PT Individual Time Calculation (min): 40 min; 40 min; 42 min   Short Term Goals: Week 1:  PT Short Term Goal 1 (Week 1): STG = LTG due to ELOS  SESSION 1 Skilled Therapeutic Interventions/Progress Updates: Patient eating breakfast in bed on entrance to room. Patient alert and agreeable to PT session.   Patient requested time to finish eating as breakfast arrived late (time given). Pt reported 1-2/10 pain on L shoulder area (tolerable)  Therapeutic Activity: Bed Mobility: Pt performed supine<sit on EOB with . VC required for minA due to getting OOB on L side and L UE NWB (will perform bed mobility on R side so R UE can assist with truncal elevation). Transfers: Pt performed sit<>stand transfers throughout session with supervision and no AD. Pt required VC to ensure back of knees touch sitting surfaces due to decreased awareness of L LE and hips not safely in position.  Pt ambulated from room<>day room gym without AD and with CGA to maintain standing balance, but minA to safely navigate due to decreased attention to L side with VC to increase. Pt also ambulated around day room/nsg loop x 2 and back to room at end of session with same cuing required and pt also presenting with sway to L gait deviation.   TUG (avg = 21.39s) with close supervision no AD  - 24.02s  - 23.07s  - 17.07s  Patient sitting in recliner at end of session with brakes locked, belt alarm set, and all needs within reach.  SESSION 2 Skilled Therapeutic Interventions/Progress Updates: Patient sitting in recliner with daughter present on entrance to room. Patient alert and agreeable to PT session.   Patient with no complaints of pain during session  Therapeutic Activity: Bed Mobility: Pt performed sit<supine from EOB on R with CGA  and VC to lie to R side to supine and to adjust self in bed via slight bridge. Transfers: Pt performed sit<>stand transfers throughout session with supervision and introduction of SPC. Pt with improved sitting safety by ensuring back of knees touch sitting surfaces.  - Pt ambulated throughout session (200'+) with R SPC (modified lofstrand with arm retainer taken off due to only bariatric SPC available). Pt with improved ability to maintain balance and decreased L sway to hallway. Pt required VC to increase awareness to L side due to slight R head rotation. Pt required CGA for safety due to.  Neuromuscular Re-ed: NMR facilitated during session with focus on L sided awareness, memory recall, visual tracking. - Pt instructed to locate discs outside of main gym hallway with specific sequence of numbers (3 total). Pt ambulated with SPC and CGA. Pt required increased time and effort to locate discs in hallway with decreased L sided attention. Pt required mod/max cuing to locate discs, and minA to recall sequence. Pt also required tactile cuing to turn to L when cued to locate disc on walls in moderately distracting environment.   NMR performed for improvements in motor control and coordination, balance, sequencing, judgement, and self confidence/ efficacy in performing all aspects of mobility at highest level of independence.   Patient supine in bed at end of session with brakes locked, daughter present, bed alarm set, and all needs within reach.  SESSION 3 Skilled Therapeutic Interventions/Progress Updates: Patient supine in bed with daughter present on entrance  to room. Patient alert and agreeable to PT session.   Patient reported tolerable unrated pain in R shoulder area.   Therapeutic Activity: Bed Mobility: Pt performed supine<>sit on EOB with CGA and VC for sequence to supine to sidelying to use R UE to assist with truncal elevation. Transfers: Pt performed sit<>stand transfers throughout session  with CGA/close supervision and VC to avoid using L UE as means to push due to NWB precautions. Pt continues to have improved recall of ensuring back of knees touch sitting surface prior to sitting. Pt donned/doffed personal shoes with supervision for safety.   Pt transported outside of [facility] to ambulated noncompliant surfaces (250'+). Pt CGA/light minA with R SPC. Pt required VC throughout to increase awareness to L side and to decrease cadence when descending slight decline. Pt stepped over shadow of pole and a few small cracks with pt reporting that he knew it was a shadow and didn't know why he stepped over it. Pt required seated rest break. Pt cued to increase B step clearance. Pt transported back to room in [facility] dependently due to time.  Patient supine in bed at end of session with brakes locked, bed alarm set, and all needs within reach.        Therapy Documentation Precautions:  Precautions Precautions: Fall Restrictions Weight Bearing Restrictions Per Provider Order: Yes LUE Weight Bearing Per Provider Order: Non weight bearing Other Position/Activity Restrictions: sling  Therapy/Group: Individual Therapy  Joshlyn Beadle PTA 06/12/2024, 12:30 PM

## 2024-06-12 NOTE — Progress Notes (Signed)
 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  Amb to gym with CGA   ROS- neg CP , SOB, N/V/D  Objective:   No results found. Recent Labs    06/11/24 0555 06/12/24 0532  WBC 8.3 6.5  HGB 12.9* 12.4*  HCT 37.9* 35.7*  PLT 251 195   Recent Labs    06/11/24 0555  NA 135  K 4.1  CL 101  CO2 25  GLUCOSE 117*  BUN 11  CREATININE 0.71  CALCIUM  8.5*    Intake/Output Summary (Last 24 hours) at 06/12/2024 0837 Last data filed at 06/12/2024 0826 Gross per 24 hour  Intake 360 ml  Output 800 ml  Net -440 ml        Physical Exam: Vital Signs Blood pressure 100/74, pulse 69, temperature 97.6 F (36.4 C), resp. rate 17, height 5' 7 (1.702 m), weight 63.7 kg, SpO2 99%.   General: No acute distress Mood and affect are appropriate Heart: Regular rate and rhythm no rubs murmurs or extra sounds Lungs: Clear to auscultation, breathing unlabored, no rales or wheezes Abdomen: Positive bowel sounds, soft nontender to palpation, nondistended Extremities: No clubbing, cyanosis, or edema Skin: No evidence of breakdown, no evidence of rash Neurologic: Cranial nerves II through XII intact, motor strength is 5/5 in RIght deltoid, bicep, tricep, grip,5/5 B hip flexor, knee extensors, ankle dorsiflexor and plantar flexor LUE 5/5 grip , proximal not tested due to clavicular fracture  Sensory exam normal sensation to light touch and proprioception in bilateral upper and lower extremities Cerebellar exam normal finger to nose to finger as well as heel to shin in bilateral upper and lower extremities Musculoskeletal:LUE in sling , good wrist and finger ROM, No swelling in hand wrist, no tenderness in shoulder  No joint swelling  Drifts to left with gait  Assessment/Plan: 1. Functional deficits which require  3+ hours per day of interdisciplinary therapy in a comprehensive inpatient rehab setting. Physiatrist is providing close team supervision and 24 hour management of active medical problems listed below. Physiatrist and rehab team continue to assess barriers to discharge/monitor patient progress toward functional and medical goals  Care Tool:  Bathing    Body parts bathed by patient: Chest, Left arm, Abdomen, Front perineal area, Buttocks, Right upper leg, Left upper leg, Right lower leg, Face, Left lower leg   Body parts bathed by helper: Right arm     Bathing assist Assist Level: Minimal Assistance - Patient > 75%     Upper Body Dressing/Undressing Upper body dressing   What is the patient wearing?: Button up shirt    Upper body assist Assist Level: Minimal Assistance - Patient > 75%    Lower Body Dressing/Undressing Lower body dressing      What is the patient wearing?: Underwear/pull up, Pants     Lower body assist Assist for lower body dressing: Minimal Assistance - Patient > 75%     Toileting Toileting    Toileting assist Assist for toileting: Minimal Assistance - Patient > 75%  Transfers Chair/bed transfer  Transfers assist     Chair/bed transfer assist level: Minimal Assistance - Patient > 75%     Locomotion Ambulation   Ambulation assist      Assist level: Minimal Assistance - Patient > 75% Assistive device: No Device Max distance: 200'   Walk 10 feet activity   Assist     Assist level: Minimal Assistance - Patient > 75% Assistive device: No Device   Walk 50 feet activity   Assist    Assist level: Minimal Assistance - Patient > 75% Assistive device: No Device    Walk 150 feet activity   Assist    Assist level: Minimal Assistance - Patient > 75% Assistive device: No Device    Walk 10 feet on uneven surface  activity   Assist     Assist level: Minimal Assistance - Patient > 75%     Wheelchair     Assist Is  the patient using a wheelchair?: No             Wheelchair 50 feet with 2 turns activity    Assist            Wheelchair 150 feet activity     Assist          Blood pressure 100/74, pulse 69, temperature 97.6 F (36.4 C), resp. rate 17, height 5' 7 (1.702 m), weight 63.7 kg, SpO2 99%.  Medical Problem List and Plan: 1. 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  Antithrombotics: -DVT/anticoagulation:  Pharmaceutical: Eliquis             -antiplatelet therapy: N/A 3. Pain Management: Tylenol  prn  4. Mood/Behavior/Sleep: LCSW to follow for evaluation and support.              --continue Melatonin at bedtime.              -antipsychotic agents: N/A 5. Neuropsych/cognition: This patient is capable of making decisions on his own behalf. 6. Skin/Wound Care: Routine pressure relief measures.  7. Fluids/Electrolytes/Nutrition: Monitor I/O. Check CMET in am.     Latest Ref Rng & Units 06/11/2024    5:55 AM 06/06/2024    9:07 AM 06/05/2024    7:00 PM  BMP  Glucose 70 - 99 mg/dL 882  97  887   BUN 8 - 23 mg/dL 11  8  13    Creatinine 0.61 - 1.24 mg/dL 9.28  9.34  9.23   Sodium 135 - 145 mmol/L 135  138  137   Potassium 3.5 - 5.1 mmol/L 4.1  3.6  3.9   Chloride 98 - 111 mmol/L 101  103  99   CO2 22 - 32 mmol/L 25  19  24    Calcium  8.9 - 10.3 mg/dL 8.5  8.6  9.4     8.  A fib rate controlled: Monitor HR TID. Now on Eliquis.  9. Thrombocytopenia: Platelets down to 102.  --Recheck in ma. 10. Abnormal LFTs: resolved but alb dropping     Latest Ref Rng & Units 06/11/2024    5:55 AM 06/06/2024    9:07 AM 06/05/2024  7:00 PM  Hepatic Function  Total Protein 6.5 - 8.1 g/dL 5.7  6.3  7.2   Albumin 3.5 - 5.0 g/dL 3.0  3.5  4.2   AST 15 - 41 U/L 17  34  53   ALT 0 - 44 U/L  14  22  23    Alk Phosphatase 38 - 126 U/L 44  40  45   Total Bilirubin 0.0 - 1.2 mg/dL 0.9  1.3  1.8     11.  Mildly comminuted distal left clavicle Fx:  Sling prn for comfort. Tylenol  for pain  12. HTN: Monitor BP TID--off amlodipine  currently.              --Avoid hypotension. Poor intake recheck BMP in am  Vitals:   06/11/24 2015 06/12/24 0426  BP: 124/88 100/74  Pulse: 79 69  Resp: 19 17  Temp: 98 F (36.7 C) 97.6 F (36.4 C)  SpO2: 99% 99%     Dyslipidemia: LDL- 127. Now on Lipitor 40 mg daily.  13.  Impaired fasting glucose: Hgb A1C- 5.4.           LOS: 2 days A FACE TO FACE EVALUATION WAS PERFORMED  Prentice FORBES Compton 06/12/2024, 8:37 AM

## 2024-06-12 NOTE — Progress Notes (Signed)
 Patient ID: Kyle [name], male   DOB: 1934-12-23, 89 y.o.   MRN: [id number]  Met with pt and Kyle [name]-daughter who is here to visit and share some concerns. She reports he had some cognitive issues prior to admission which concerns her. Discussed he will need supervision level due to safety and cognitive issues. She and pt agree he can get someone to assist if needed. The main plan is to keep pt at home when discharged. Kyle [name] was asking for HCPOA and Durable POA. Made aware we can do HCPOA but not the durable POA here. Gave her the packet for the HCPOA and she will pursue the durable POA on her own with pt's son. Made aware pt is doing well and will be ready for discharge on Tuesday 10/7. Discussed home health and can set up if recommended. Will continue to work on discharge needs.

## 2024-06-13 NOTE — Progress Notes (Signed)
 Physical Therapy Session Note  Patient Details  Name: Kyle [name] MRN: [id number] Date of Birth: 10-20-1934  Today's Date: 06/13/2024 PT Individual Time: 1417-1528 PT Individual Time Calculation (min): 71 min   Short Term Goals: Week 1:  PT Short Term Goal 1 (Week 1): STG = LTG due to ELOS  Skilled Therapeutic Interventions/Progress Updates: Patient supine in bed on entrance to room. Patient alert and agreeable to PT session.   Patient reported no pain.  Therapeutic Activity: Bed Mobility: Pt performed supine<sit from EOB with min/heavy minA this session as pt also required max cuing for set up sequence to use R UE to push off of bed to elevate trunk. Pt supervision at end of session from sit<supine from EOB. Transfers: Pt performed sit<>stand transfers throughout session with SPC or no AD with CGA for safety. Provided VC for pt to adhere to NWB precaution on L UE (pt would say he was not pushing but still attempting to push).  Pt transported outside to [facility] center to ambulate on noncompliant surfaces. Pt required max cuing to increase L sided awareness per slight R cervical rotation, and would drift to R. Pt with R SPC. Pt cued to decrease cadence with descending sidewalk. Pt overall CGA to maintain standing balance, but min/modA to safely navigate environment. Pt ambulated 250'+ with rest break and was transported back to rehab floor in [facility]  Neuromuscular Re-ed: - Pt standing at table no UE support and cues to place discs in descending order starting from 12 (to the R). Pt required increased time/effort/max multimodal cuing to place in descending order vs ascending as pt continuously attempted to place. Pt ultimately required cuing of what number comes before ___ with PTA pointing to correct spot, but pt would still attempt to place in ascending order. Pt eventually got the order right, but this took over 10 minutes.   NMR performed for improvements in motor control and coordination,  balance, sequencing, judgement, and self confidence/ efficacy in performing all aspects of mobility at highest level of independence.   Patient supine in bed at end of session with brakes locked, bed alarm set, and all needs within reach.      Therapy Documentation Precautions:  Precautions Precautions: Fall Restrictions Weight Bearing Restrictions Per Provider Order: Yes LUE Weight Bearing Per Provider Order: Non weight bearing Other Position/Activity Restrictions: sling  Therapy/Group: Individual Therapy  Calais Svehla PTA 06/13/2024, 3:34 PM

## 2024-06-13 NOTE — Progress Notes (Signed)
 PROGRESS NOTE   Subjective/Complaints:  No issues overnight, reviewed labs  ROS- neg CP , SOB, N/V/D  Objective:   No results found. Recent Labs    06/11/24 0555 06/12/24 0532  WBC 8.3 6.5  HGB 12.9* 12.4*  HCT 37.9* 35.7*  PLT 251 195   Recent Labs    06/11/24 0555 06/12/24 0939  NA 135 134*  K 4.1 4.5  CL 101 100  CO2 25 26  GLUCOSE 117* 128*  BUN 11 11  CREATININE 0.71 0.71  CALCIUM  8.5* 8.8*    Intake/Output Summary (Last 24 hours) at 06/13/2024 0934 Last data filed at 06/13/2024 0920 Gross per 24 hour  Intake 540 ml  Output 1300 ml  Net -760 ml        Physical Exam: Vital Signs Blood pressure 136/73, pulse 71, temperature (!) 97.4 F (36.3 C), resp. rate 16, height 5' 7 (1.702 m), weight 63.7 kg, SpO2 99%.   General: No acute distress Mood and affect are appropriate Heart: Regular rate and rhythm no rubs murmurs or extra sounds Lungs: Clear to auscultation, breathing unlabored, no rales or wheezes Abdomen: Positive bowel sounds, soft nontender to palpation, nondistended Extremities: No clubbing, cyanosis, or edema Skin: No evidence of breakdown, no evidence of rash Neurologic: Cranial nerves II through XII intact, motor strength is 5/5 in RIght deltoid, bicep, tricep, grip,5/5 B hip flexor, knee extensors, ankle dorsiflexor and plantar flexor LUE 5/5 grip , proximal not tested due to clavicular fracture  Sensory exam normal sensation to light touch and proprioception in bilateral upper and lower extremities Cerebellar exam normal finger to nose to finger as well as heel to shin in bilateral upper and lower extremities Musculoskeletal:LUE in sling , good wrist and finger ROM, No swelling in hand wrist, no tenderness in shoulder  No joint swelling  Drifts to left with gait  Assessment/Plan: 1. Functional deficits which require 3+ hours per day of interdisciplinary therapy in a comprehensive  inpatient rehab setting. Physiatrist is providing close team supervision and 24 hour management of active medical problems listed below. Physiatrist and rehab team continue to assess barriers to discharge/monitor patient progress toward functional and medical goals  Care Tool:  Bathing    Body parts bathed by patient: Chest, Left arm, Abdomen, Front perineal area, Buttocks, Right upper leg, Left upper leg, Right lower leg, Face, Left lower leg   Body parts bathed by helper: Right arm     Bathing assist Assist Level: Minimal Assistance - Patient > 75%     Upper Body Dressing/Undressing Upper body dressing   What is the patient wearing?: Button up shirt    Upper body assist Assist Level: Minimal Assistance - Patient > 75%    Lower Body Dressing/Undressing Lower body dressing      What is the patient wearing?: Underwear/pull up, Pants     Lower body assist Assist for lower body dressing: Minimal Assistance - Patient > 75%     Toileting Toileting    Toileting assist Assist for toileting: Minimal Assistance - Patient > 75%     Transfers Chair/bed transfer  Transfers assist     Chair/bed transfer assist level: Minimal Assistance -  Patient > 75%     Locomotion Ambulation   Ambulation assist      Assist level: Minimal Assistance - Patient > 75% Assistive device: No Device Max distance: 200'   Walk 10 feet activity   Assist     Assist level: Minimal Assistance - Patient > 75% Assistive device: No Device   Walk 50 feet activity   Assist    Assist level: Minimal Assistance - Patient > 75% Assistive device: No Device    Walk 150 feet activity   Assist    Assist level: Minimal Assistance - Patient > 75% Assistive device: No Device    Walk 10 feet on uneven surface  activity   Assist     Assist level: Minimal Assistance - Patient > 75%     Wheelchair     Assist Is the patient using a wheelchair?: No             Wheelchair  50 feet with 2 turns activity    Assist            Wheelchair 150 feet activity     Assist          Blood pressure 136/73, pulse 71, temperature (!) 97.4 F (36.3 C), resp. rate 16, height 5' 7 (1.702 m), weight 63.7 kg, SpO2 99%.  Medical Problem List and Plan: 1. 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  Antithrombotics: -DVT/anticoagulation:  Pharmaceutical: Eliquis             -antiplatelet therapy: N/A 3. Pain Management: Tylenol  prn  4. Mood/Behavior/Sleep: LCSW to follow for evaluation and support.              --continue Melatonin at bedtime.              -antipsychotic agents: N/A 5. Neuropsych/cognition: This patient is capable of making decisions on his own behalf. 6. Skin/Wound Care: Routine pressure relief measures.  7. Fluids/Electrolytes/Nutrition: Monitor I/O. Check CMET in am.     Latest Ref Rng & Units 06/12/2024    9:39 AM 06/11/2024    5:55 AM 06/06/2024    9:07 AM  BMP  Glucose 70 - 99 mg/dL 871  882  97   BUN 8 - 23 mg/dL 11  11  8    Creatinine 0.61 - 1.24 mg/dL 9.28  9.28  9.34   Sodium 135 - 145 mmol/L 134  135  138   Potassium 3.5 - 5.1 mmol/L 4.5  4.1  3.6   Chloride 98 - 111 mmol/L 100  101  103   CO2 22 - 32 mmol/L 26  25  19    Calcium  8.9 - 10.3 mg/dL 8.8  8.5  8.6     8.  A fib rate controlled: Monitor HR TID. Now on Eliquis.  9. Thrombocytopenia: Platelets down to 102.  --Recheck in ma. 10. Abnormal LFTs: resolved but alb dropping     Latest Ref Rng & Units 06/11/2024    5:55 AM 06/06/2024    9:07 AM 06/05/2024    7:00 PM  Hepatic Function  Total Protein 6.5 - 8.1 g/dL 5.7  6.3  7.2   Albumin 3.5 - 5.0 g/dL 3.0  3.5  4.2   AST 15 - 41 U/L 17  34  53   ALT 0 - 44 U/L 14  22  23    Alk Phosphatase 38 - 126 U/L 44  40  45   Total  Bilirubin 0.0 - 1.2 mg/dL 0.9  1.3  1.8   Ensure high-protein twice daily 11.  Mildly comminuted distal left clavicle Fx:  Sling prn for comfort. Tylenol  for pain  12. HTN: Monitor BP TID--off amlodipine  currently.              --Avoid hypotension. Poor intake recheck BMP in am  Vitals:   06/12/24 1956 06/13/24 0515  BP: 118/67 136/73  Pulse: 76 71  Resp: 16 16  Temp: 98.4 F (36.9 C) (!) 97.4 F (36.3 C)  SpO2: 100% 99%     Dyslipidemia: LDL- 127. Now on Lipitor 40 mg daily.  13.  Impaired fasting glucose: Hgb A1C- 5.4.           LOS: 3 days A FACE TO FACE EVALUATION WAS PERFORMED  Prentice FORBES Compton 06/13/2024, 9:34 AM

## 2024-06-13 NOTE — Progress Notes (Signed)
 Occupational Therapy Session Note  Patient Details  Name: Kyle [name] MRN: [id number] Date of Birth: 12/24/1934  Today's Date: 06/13/2024 OT Individual Time: 9047-8944 OT Individual Time Calculation (min): 63 min    Short Term Goals: Week 1:  OT Short Term Goal 1 (Week 1): STGs = LTGs  Skilled Therapeutic Interventions/Progress Updates:    Pt received in recliner with granddtr present.  Pt dressed and ready for the day. Offered him to opportunity to shower, but pt stated he was fine. His daughter and son in law arrived and participated in the therapy session.    Discussed his home set up of a walk in shower.  Pt ambulated to ADL apt with OT student supporting him with light CGA and occasional cues for L side awareness.  In Apt, pt practiced stepping in and out of shower. Discussed using a shower seat in shower vs just the small built in corner stool, where to place a grab bar and the height of the toilet.  Family will take some measurements and see if a grab bar could be placed on the shower wall. Pt stepped over  ledge well but did need cuing for how to turn his body to step in safely.  Pt then ambulated to ortho gym to work on L visual scanning with a target finding tasks. Pt initially had difficulty finding targets on L but with cues to scan to L by turning his head he improved on each  trial.    Spent time with pt and family discussing realistic expectations for discharge.  He lives alone and was extremely independent, but he now is NWB in LUE for a short time period (10-12 weeks?) and he has a new L visual deficit that he needs to learn to adapt around.   I expressed that during this time period pt should have full supervision as he is so active and he will likely try to do tasks in the home that are too involved and his risk of tripping and falling are much higher right now.   We will keep working on his visual scanning and safety awareness but being home alone all night is not the safest  choice right now.  Pt ambulated back to his room but his ability to dual task was very limited. Pt had to stop walking everytime he needed to talk.  He did have one LOB with min A to recover when distracted.    Pt returned to recliner to rest. All needs met.  Call light in reach and alarm set   Therapy Documentation Precautions:  Precautions Precautions: Fall Restrictions Weight Bearing Restrictions Per Provider Order: Yes LUE Weight Bearing Per Provider Order: Non weight bearing Other Position/Activity Restrictions: sling  Therapy Vitals Temp: 98.5 F (36.9 C) Temp Source: Oral Pulse Rate: 79 Resp: 18 BP: 133/76 Patient Position (if appropriate): Lying Oxygen Therapy SpO2: 100 % O2 Device: Room Air Pain: Pain Assessment Pain Scale: 0-10 Pain Score: 0-No pain    Therapy/Group: Individual Therapy  Keara Pagliarulo 06/13/2024, 1:17 PM

## 2024-06-13 NOTE — Progress Notes (Signed)
 Patient ID: Kyle [name], male   DOB: 02-09-1935, 89 y.o.   MRN: [id number]  Spoke to [facility] via phone call to provide an update per his request. Informed him that there is no concrete discharge plan at the moment - updates on a discharge date and plan will be available next week after team conference. Kyle [name] was understanding and thankful for the update.

## 2024-06-13 NOTE — Progress Notes (Signed)
 Speech Language Pathology Daily Session Note  Patient Details  Name: Kyle [name] MRN: [id number] Date of Birth: 1934/10/04  Today's Date: 06/13/2024 SLP Individual Time: 1100-1200 SLP Individual Time Calculation (min): 60 min  Short Term Goals: Week 1: SLP Short Term Goal 1 (Week 1): STGs=LTGs d/t ELOS  Skilled Therapeutic Interventions: SLP conducted skilled therapy session targeting cognitive goals. SLP facilitated sustained attention and scanning task where patient was tasked with following directions when given varied word lists. Patient benefited from max assist for processing speed, mod to max assist scanning and thoroughness and min assist for working memory and sustained attention. Daughter present throughout session and providing appropriate cuing under SLP supervision throughout. SLP then facilitated basic financial addition task where patient again performed as reflected above with overall need for mod to max assist for thorough scanning and min to mod assist for verbal problem solving once items were identified. Patient was left in room with call bell in reach and alarm set. SLP will continue to target goals per plan of care.        Pain  None  Therapy/Group: Individual Therapy  Riaz Onorato, M.A., CCC-SLP  Floyd Lusignan A Kaj Vasil 06/13/2024, 12:16 PM

## 2024-06-13 NOTE — Plan of Care (Signed)
  Problem: Consults Goal: RH STROKE PATIENT EDUCATION Description: See Patient Education module for education specifics  Outcome: Progressing   Problem: RH BLADDER ELIMINATION Goal: RH STG MANAGE BLADDER WITH ASSISTANCE Description: STG Manage Bladder With mod I Assistance Outcome: Progressing Goal: RH STG MANAGE BLADDER WITH MEDICATION WITH ASSISTANCE Description: STG Manage Bladder With Medication With Assistance. Outcome: Progressing   Problem: RH KNOWLEDGE DEFICIT Goal: RH STG INCREASE KNOWLEDGE OF HYPERTENSION Description: Patient and family will be able to manage HTN using educational resources for medication and dietary modification independently Outcome: Progressing Goal: RH STG INCREASE KNOWLEGDE OF HYPERLIPIDEMIA Description: Patient and family will be able to manage HLD using educational resources for medication and dietary modification independently Outcome: Progressing Goal: RH STG INCREASE KNOWLEDGE OF STROKE PROPHYLAXIS Description: Patient and family will be able to manage secondary risk using educational resources for medication and dietary modification independently Outcome: Progressing

## 2024-06-14 NOTE — Progress Notes (Signed)
 Physical Therapy Session Note  Patient Details  Name: Kyle [name] MRN: [id number] Date of Birth: 11-12-1934  Today's Date: 06/14/2024 PT Individual Time: 1021-1117; 1423 - 1450 PT Individual Time Calculation (min): 56 min; 27 min   Short Term Goals: Week 1:  PT Short Term Goal 1 (Week 1): STG = LTG due to ELOS  SESSION 1 Skilled Therapeutic Interventions/Progress Updates: Patient supine in bed following nsg care on entrance to room. Patient alert and agreeable to PT session.   Patient reported no pain during session. Pt reported that telesitter communicated to him this morning to avoid getting out of bed. Pt stated he thought he was in his pick up truck. Pt oriented to self, time, place (2 x to get place right as pt thought he was at [facility]) and halfway oriented to situation (stated that he fell but not having a stroke).  Therapeutic Activity: Bed Mobility: Pt performed supine<sit on EOB with CGA.  Transfers: Pt performed sit<>stand transfers throughout session with R SPC and CGA/minA at first due to pt first time out of bed this morning. Provided VC to avoid using L UE to push due to NWB status.  Neuromuscular Re-ed: NMR facilitated during session with focus on L sided awareness, weight shift, proprioception, dual tasks. - Pt ambulated throughout session with cues to increase awareness to L side and to weight shift accordingly. Pt required minA at first from room<day room - Pt ambulated 300'+ around nsg/day room loop with cues to look in various directions while maintaining cadence. Pt with CGA/light minA throughout and no AD and some gait deviations noted when rotating head. Pt cued to count from 12 to 1 while walking back to mat with pt requiring increase in time to think about sequence. - Pt standing and cued to place numbered discs in sequential order starting from 9-1. Pt required VC to place in number pattern vs color likeness. Pt also required cues to place discs next to each  other vs stacking with hints to find number that is now hidden. Pt able to place in descending order much quicker this session vs yesterday's session with this PTA.   NMR performed for improvements in motor control and coordination, balance, sequencing, judgement, and self confidence/ efficacy in performing all aspects of mobility at highest level of independence.   Therapeutic Exercise: Pt performed the following exercises with therapist providing the described cuing and facilitation for improvement. NuStep; 0.5 miles; 905; 60 avg spm; 1.7 METs on level 3 resistance for 12 minutes in order to increase cardiovascular endurance.    Patient sitting in recliner at end of session with brakes locked, family present, belt alarm set, and all needs within reach.  SESSION 2 Skilled Therapeutic Interventions/Progress Updates: Patient R sidelying on entrance to room. Patient alert and agreeable to PT session.   Patient reported no pain during session. Pt performed bed mobility with close supervision (sidelying to sit and sit<supine at end of session). Pt performed standing transfers with no AD and with CGA for safety. Pt transported outside to [facility] to practice multidirectional stepping on noncompliant surfaces. Pt cued to ambulate without AD and with overall CGA and to follow along lines on sidewalk to improve L step turn (pt would only turn about 45* when cued to turn to the L). Pt improved full 90* turn to L after few rounds of following lines (max cuing at first to increase full awareness to L). Pt cued to increase step length when stepping retro. Pt  did not require seated rest but had to be transported back to room due to pants falling down while walking (pt aware but did not say anything until PTA noticed). Pt back in room and stood  while PTA adjust hospital brief and pants. Pt required increased cuing to follow commands directly (pt would pick up leg on R and place around leg rest multiple times despite  cuing to only lift up instead of out).   Patient supine in bed at end of session with brakes locked, bed alarm set, and all needs within reach.       Therapy Documentation Precautions:  Precautions Precautions: Fall Restrictions Weight Bearing Restrictions Per Provider Order: Yes LUE Weight Bearing Per Provider Order: Non weight bearing Other Position/Activity Restrictions: sling   Therapy/Group: Individual Therapy  Sarahy Creedon PTA 06/14/2024, 11:20 AM

## 2024-06-14 NOTE — Progress Notes (Signed)
 Speech Language Pathology Daily Session Note  Patient Details  Name: Kyle [name] MRN: [id number] Date of Birth: 1935-04-24  Today's Date: 06/14/2024 SLP Individual Time: 0915-1000 SLP Individual Time Calculation (min): 45 min  Short Term Goals: Week 1: SLP Short Term Goal 1 (Week 1): STGs=LTGs d/t ELOS  Skilled Therapeutic Interventions: SLP conducted skilled therapy session targeting cognitive goals. SLP facilitated sequencing and pattern replication task. Patient required max assist for sequencing during repositioning into upright position for task in bed. Patient required max assist for sustained attention for 20 minute task and required mod assist for basic sequencing and pattern replication. Patient required mod assist for error recognition and max assist to correct mistakes once identified. Patient was left in room with call bell in reach and alarm set. SLP will continue to target goals per plan of care.        Pain  None endorsed  Therapy/Group: Individual Therapy  Jeovany Huitron, M.A., CCC-SLP  Setareh Rom A [name] 06/14/2024, 10:04 AM

## 2024-06-14 NOTE — Progress Notes (Signed)
 Occupational Therapy Session Note  Patient Details  Name: Kyle [name] MRN: [id number] Date of Birth: 05/25/1935  Today's Date: 06/14/2024 OT Individual Time: 8694-8577 OT Individual Time Calculation (min): 77 min    Short Term Goals: Week 1:  OT Short Term Goal 1 (Week 1): STGs = LTGs  Skilled Therapeutic Interventions/Progress Updates:    Patient lying on side of bed talking to grand dtr up on approach for OT.   He was agreeable to OT services.  Focus was as follows.  - safety awareness in home and his room (He was able id barriers during functional ambulation and how to correct situations for safety) - toileting (He voiced need to toilet and was allowed to take the amount of time he needed to complete): extra time and verbal cues to pull up brief in back on far left hip) -high level balance activities.  Patient spoke non stop during session and was pleasant.  Patient complained of earlier in the day and at other times that the sling sometimes pulling on his neck and creating pain.    Sling appears too long for this patient's arm length and kept sliding out during therapy.   This clinician repositioned several times.  At the end of the session, Patient was left lying in bed, sitter service was still employed, bed alarm engaged.  Patient demonstrated use of call bell in case he needed pain meds, toileting, etc.  Patient will benefit from continued OT to address goals and especially to address safety awareness as prior to incident, patient stated he lived alone and son a few minutes away  Continue OT POC   Therapy Documentation Precautions:  Precautions Precautions: Fall Restrictions Weight Bearing Restrictions Per Provider Order: Yes LUE Weight Bearing Per Provider Order: Non weight bearing Other Position/Activity Restrictions: sling General:   Vital Signs: Therapy Vitals Temp: 97.7 F (36.5 C) Pulse Rate: 84 Resp: 18 BP: 136/72 Patient Position (if appropriate):  Sitting Oxygen Therapy SpO2: 97 % O2 Device: Room Air Pain: Pain Assessment Pain Scale: 0-10 Pain Score: 0-No pain ADL: ADL ADL Comments: pt requires min A with self care but with the use of a long sponge, he can bathe with supervision, he could also dress with supervision if he had a tshirt or pull on gym shorts vs pajamas with a tie Vision   Perception    Praxis   Balance   Exercises:   Other Treatments:     Therapy/Group: Individual Therapy  [place]aneta Catheryn Groveton 06/14/2024, 2:50 PM

## 2024-06-14 NOTE — Plan of Care (Signed)
  Problem: Consults Goal: RH STROKE PATIENT EDUCATION Description: See Patient Education module for education specifics  Outcome: Progressing   Problem: RH BLADDER ELIMINATION Goal: RH STG MANAGE BLADDER WITH ASSISTANCE Description: STG Manage Bladder With mod I Assistance Outcome: Progressing Goal: RH STG MANAGE BLADDER WITH MEDICATION WITH ASSISTANCE Description: STG Manage Bladder With Medication With Assistance. Outcome: Progressing   Problem: RH KNOWLEDGE DEFICIT Goal: RH STG INCREASE KNOWLEDGE OF HYPERTENSION Description: Patient and family will be able to manage HTN using educational resources for medication and dietary modification independently Outcome: Progressing Goal: RH STG INCREASE KNOWLEGDE OF HYPERLIPIDEMIA Description: Patient and family will be able to manage HLD using educational resources for medication and dietary modification independently Outcome: Progressing Goal: RH STG INCREASE KNOWLEDGE OF STROKE PROPHYLAXIS Description: Patient and family will be able to manage secondary risk using educational resources for medication and dietary modification independently Outcome: Progressing

## 2024-06-14 NOTE — Progress Notes (Signed)
 PROGRESS NOTE   Subjective/Complaints:  Pt reports doing pretty well LBM yesterday Thinks will go today again.  Needs to get home to make molasses Ate 50-75% of tray  ROS-  Pt denies SOB, abd pain, CP, N/V/C/D, and vision changes   Objective:   No results found. Recent Labs    06/12/24 0532  WBC 6.5  HGB 12.4*  HCT 35.7*  PLT 195   Recent Labs    06/12/24 0939  NA 134*  K 4.5  CL 100  CO2 26  GLUCOSE 128*  BUN 11  CREATININE 0.71  CALCIUM  8.8*    Intake/Output Summary (Last 24 hours) at 06/14/2024 1321 Last data filed at 06/14/2024 1309 Gross per 24 hour  Intake 558 ml  Output 600 ml  Net -42 ml        Physical Exam: Vital Signs Blood pressure 132/86, pulse 79, temperature 98 F (36.7 C), resp. rate 16, height 5' 7 (1.702 m), weight 63.7 kg, SpO2 93%.    General: awake, alert, appropriate, sitting up in bed; was asleep initially;  NAD HENT: conjugate gaze; oropharynx moist CV: regular rate and rhythm; no JVD Pulmonary: CTA B/L; no W/R/R- good air movement GI: soft, NT, ND, (+)BS Psychiatric: appropriate- verbose Neurological: Ox3, but tangential about making molasses MSK- LUE in sling NWB Skin: No evidence of breakdown, no evidence of rash Neurologic: Cranial nerves II through XII intact, motor strength is 5/5 in RIght deltoid, bicep, tricep, grip,5/5 B hip flexor, knee extensors, ankle dorsiflexor and plantar flexor LUE 5/5 grip , proximal not tested due to clavicular fracture  Sensory exam normal sensation to light touch and proprioception in bilateral upper and lower extremities Cerebellar exam normal finger to nose to finger as well as heel to shin in bilateral upper and lower extremities Musculoskeletal:LUE in sling , good wrist and finger ROM, No swelling in hand wrist, no tenderness in shoulder  No joint swelling  Drifts to left with gait  Assessment/Plan: 1. Functional deficits  which require 3+ hours per day of interdisciplinary therapy in a comprehensive inpatient rehab setting. Physiatrist is providing close team supervision and 24 hour management of active medical problems listed below. Physiatrist and rehab team continue to assess barriers to discharge/monitor patient progress toward functional and medical goals  Care Tool:  Bathing    Body parts bathed by patient: Chest, Left arm, Abdomen, Front perineal area, Buttocks, Right upper leg, Left upper leg, Right lower leg, Face, Left lower leg   Body parts bathed by helper: Right arm     Bathing assist Assist Level: Minimal Assistance - Patient > 75%     Upper Body Dressing/Undressing Upper body dressing   What is the patient wearing?: Button up shirt    Upper body assist Assist Level: Minimal Assistance - Patient > 75%    Lower Body Dressing/Undressing Lower body dressing      What is the patient wearing?: Underwear/pull up, Pants     Lower body assist Assist for lower body dressing: Minimal Assistance - Patient > 75%     Toileting Toileting    Toileting assist Assist for toileting: Minimal Assistance - Patient > 75%  Transfers Chair/bed transfer  Transfers assist     Chair/bed transfer assist level: Minimal Assistance - Patient > 75%     Locomotion Ambulation   Ambulation assist      Assist level: Minimal Assistance - Patient > 75% Assistive device: No Device Max distance: 200'   Walk 10 feet activity   Assist     Assist level: Minimal Assistance - Patient > 75% Assistive device: No Device   Walk 50 feet activity   Assist    Assist level: Minimal Assistance - Patient > 75% Assistive device: No Device    Walk 150 feet activity   Assist    Assist level: Minimal Assistance - Patient > 75% Assistive device: No Device    Walk 10 feet on uneven surface  activity   Assist     Assist level: Minimal Assistance - Patient > 75%      Wheelchair     Assist Is the patient using a wheelchair?: No             Wheelchair 50 feet with 2 turns activity    Assist            Wheelchair 150 feet activity     Assist          Blood pressure 132/86, pulse 79, temperature 98 F (36.7 C), resp. rate 16, height 5' 7 (1.702 m), weight 63.7 kg, SpO2 93%.  Medical Problem List and Plan: 1. 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  Antithrombotics: -DVT/anticoagulation:  Pharmaceutical: Eliquis             -antiplatelet therapy: N/A 3. Pain Management: Tylenol  prn   10/4- not c/o a lot of pain of LUE- OK with tylenol   4. Mood/Behavior/Sleep: LCSW to follow for evaluation and support.              --continue Melatonin at bedtime.              -antipsychotic agents: N/A 5. Neuropsych/cognition: This patient is capable of making decisions on his own behalf. 6. Skin/Wound Care: Routine pressure relief measures.  7. Fluids/Electrolytes/Nutrition: Monitor I/O. Check CMET in am.     Latest Ref Rng & Units 06/12/2024    9:39 AM 06/11/2024    5:55 AM 06/06/2024    9:07 AM  BMP  Glucose 70 - 99 mg/dL 871  882  97   BUN 8 - 23 mg/dL 11  11  8    Creatinine 0.61 - 1.24 mg/dL 9.28  9.28  9.34   Sodium 135 - 145 mmol/L 134  135  138   Potassium 3.5 - 5.1 mmol/L 4.5  4.1  3.6   Chloride 98 - 111 mmol/L 100  101  103   CO2 22 - 32 mmol/L 26  25  19    Calcium  8.9 - 10.3 mg/dL 8.8  8.5  8.6     8.  A fib rate controlled: Monitor HR TID. Now on Eliquis.  9. Thrombocytopenia: Platelets down to 102.  --Recheck in ma. 10. Abnormal LFTs: resolved but alb dropping     Latest Ref Rng &  Units 06/11/2024    5:55 AM 06/06/2024    9:07 AM 06/05/2024    7:00 PM  Hepatic Function  Total Protein 6.5 - 8.1 g/dL 5.7  6.3   7.2   Albumin 3.5 - 5.0 g/dL 3.0  3.5  4.2   AST 15 - 41 U/L 17  34  53   ALT 0 - 44 U/L 14  22  23    Alk Phosphatase 38 - 126 U/L 44  40  45   Total Bilirubin 0.0 - 1.2 mg/dL 0.9  1.3  1.8   Ensure high-protein twice daily 11.  Mildly comminuted distal left clavicle Fx:  Sling prn for comfort. Tylenol  for pain  12. HTN: Monitor BP TID--off amlodipine  currently.              --Avoid hypotension. Poor intake recheck BMP in am  Vitals:   06/13/24 1949 06/14/24 0407  BP: 123/60 132/86  Pulse: 70 79  Resp: 16 16  Temp: 97.8 F (36.6 C) 98 F (36.7 C)  SpO2: 97% 93%     Dyslipidemia: LDL- 127. Now on Lipitor 40 mg daily.  13.  Impaired fasting glucose: Hgb A1C- 5.4.    10/4- Last BG on BMP was 128-         LOS: 4 days A FACE TO FACE EVALUATION WAS PERFORMED  Johnika Escareno 06/14/2024, 1:21 PM

## 2024-06-15 NOTE — Progress Notes (Signed)
 PROGRESS NOTE   Subjective/Complaints:  Pt reports no pain- just soreness in L shoulder.  LBM yesterday ~ 4pm- is not documented? But had one at 1130 am today- medium- type 4.   Pt still has telesitter.   ROS-   Pt denies SOB, abd pain, CP, N/V/C/D, and vision changes   Objective:   No results found. No results for input(s): WBC, HGB, HCT, PLT in the last 72 hours.  No results for input(s): NA, K, CL, CO2, GLUCOSE, BUN, CREATININE, CALCIUM  in the last 72 hours.   Intake/Output Summary (Last 24 hours) at 06/15/2024 1159 Last data filed at 06/15/2024 1141 Gross per 24 hour  Intake 560 ml  Output 1450 ml  Net -890 ml        Physical Exam: Vital Signs Blood pressure 123/73, pulse 74, temperature 97.6 F (36.4 C), resp. rate 16, height 5' 7 (1.702 m), weight 63.7 kg, SpO2 97%.     General: awake, alert, appropriate, just woke him up; supine in bed; NAD HENT: conjugate gaze; oropharynx dry CV: regular rate; no JVD Pulmonary: CTA B/L; no W/R/R- good air movement GI: soft, NT, ND, (+)BS- normoactive Psychiatric: appropriate- somewhat tangential Neurological: alert MSK- LUE in sling NWB- no change Skin: No evidence of breakdown, no evidence of rash Neurologic: Cranial nerves II through XII intact, motor strength is 5/5 in RIght deltoid, bicep, tricep, grip,5/5 B hip flexor, knee extensors, ankle dorsiflexor and plantar flexor LUE 5/5 grip , proximal not tested due to clavicular fracture  Sensory exam normal sensation to light touch and proprioception in bilateral upper and lower extremities Cerebellar exam normal finger to nose to finger as well as heel to shin in bilateral upper and lower extremities Musculoskeletal:LUE in sling , good wrist and finger ROM, No swelling in hand wrist, no tenderness in shoulder  No joint swelling  Drifts to left with gait  Assessment/Plan: 1. Functional  deficits which require 3+ hours per day of interdisciplinary therapy in a comprehensive inpatient rehab setting. Physiatrist is providing close team supervision and 24 hour management of active medical problems listed below. Physiatrist and rehab team continue to assess barriers to discharge/monitor patient progress toward functional and medical goals  Care Tool:  Bathing    Body parts bathed by patient: Chest, Left arm, Abdomen, Front perineal area, Buttocks, Right upper leg, Left upper leg, Right lower leg, Face, Left lower leg   Body parts bathed by helper: Right arm     Bathing assist Assist Level: Minimal Assistance - Patient > 75%     Upper Body Dressing/Undressing Upper body dressing   What is the patient wearing?: Button up shirt    Upper body assist Assist Level: Minimal Assistance - Patient > 75%    Lower Body Dressing/Undressing Lower body dressing      What is the patient wearing?: Underwear/pull up, Pants     Lower body assist Assist for lower body dressing: Minimal Assistance - Patient > 75%     Toileting Toileting    Toileting assist Assist for toileting: Minimal Assistance - Patient > 75%     Transfers Chair/bed transfer  Transfers assist     Chair/bed transfer assist  level: Minimal Assistance - Patient > 75%     Locomotion Ambulation   Ambulation assist      Assist level: Minimal Assistance - Patient > 75% Assistive device: No Device Max distance: 200'   Walk 10 feet activity   Assist     Assist level: Minimal Assistance - Patient > 75% Assistive device: No Device   Walk 50 feet activity   Assist    Assist level: Minimal Assistance - Patient > 75% Assistive device: No Device    Walk 150 feet activity   Assist    Assist level: Minimal Assistance - Patient > 75% Assistive device: No Device    Walk 10 feet on uneven surface  activity   Assist     Assist level: Minimal Assistance - Patient > 75%      Wheelchair     Assist Is the patient using a wheelchair?: No             Wheelchair 50 feet with 2 turns activity    Assist            Wheelchair 150 feet activity     Assist          Blood pressure 123/73, pulse 74, temperature 97.6 F (36.4 C), resp. rate 16, height 5' 7 (1.702 m), weight 63.7 kg, SpO2 97%.  Medical Problem List and Plan: 1. 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  Antithrombotics: -DVT/anticoagulation:  Pharmaceutical: Eliquis             -antiplatelet therapy: N/A 3. Pain Management: Tylenol  prn   10/4- not c/o a lot of pain of LUE-  OK with tylenol    10/5- soreness in LUE/shoulder- con't tylenol  4. Mood/Behavior/Sleep: LCSW to follow for evaluation and support.              --continue Melatonin at bedtime.              -antipsychotic agents: N/A 5. Neuropsych/cognition: This patient is capable of making decisions on his own behalf. 6. Skin/Wound Care: Routine pressure relief measures.  7. Fluids/Electrolytes/Nutrition: Monitor I/O. Check CMET in am.     Latest Ref Rng & Units 06/12/2024    9:39 AM 06/11/2024    5:55 AM 06/06/2024    9:07 AM  BMP  Glucose 70 - 99 mg/dL 871  882  97   BUN 8 - 23 mg/dL 11  11  8    Creatinine 0.61 - 1.24 mg/dL 9.28  9.28  9.34   Sodium 135 - 145 mmol/L 134  135  138   Potassium 3.5 - 5.1 mmol/L 4.5  4.1  3.6   Chloride 98 - 111 mmol/L 100  101  103   CO2 22 - 32 mmol/L 26  25  19    Calcium  8.9 - 10.3 mg/dL 8.8  8.5  8.6     8.  A fib rate controlled: Monitor HR TID. Now on Eliquis.  9. Thrombocytopenia: Platelets down to 102.  --Recheck in ma. 10. Abnormal LFTs: resolved but alb dropping     Latest Ref Rng & Units 06/11/2024  5:55 AM 06/06/2024    9:07 AM 06/05/2024    7:00 PM   Hepatic Function  Total Protein 6.5 - 8.1 g/dL 5.7  6.3  7.2   Albumin 3.5 - 5.0 g/dL 3.0  3.5  4.2   AST 15 - 41 U/L 17  34  53   ALT 0 - 44 U/L 14  22  23    Alk Phosphatase 38 - 126 U/L 44  40  45   Total Bilirubin 0.0 - 1.2 mg/dL 0.9  1.3  1.8   Ensure high-protein twice daily 11.  Mildly comminuted distal left clavicle Fx:  Sling prn for comfort. Tylenol  for pain  12. HTN: Monitor BP TID--off amlodipine  currently.              --Avoid hypotension. Poor intake recheck BMP in am   10/5- BP running 120's 140's- con't regimen Vitals:   06/14/24 2013 06/15/24 0441  BP: (!) 146/82 123/73  Pulse: 73 74  Resp: 15 16  Temp: (!) 97.3 F (36.3 C) 97.6 F (36.4 C)  SpO2: 99% 97%     Dyslipidemia: LDL- 127. Now on Lipitor 40 mg daily.  13.  Impaired fasting glucose: Hgb A1C- 5.4.    10/4- Last BG on BMP was 128-         LOS: 5 days A FACE TO FACE EVALUATION WAS PERFORMED  Breaker Springer 06/15/2024, 11:59 AM

## 2024-06-16 LAB — CBC
HCT: 38.7 % — ABNORMAL LOW (ref 39.0–52.0)
Hemoglobin: 13.2 g/dL (ref 13.0–17.0)
MCH: 30.6 pg (ref 26.0–34.0)
MCHC: 34.1 g/dL (ref 30.0–36.0)
MCV: 89.6 fL (ref 80.0–100.0)
Platelets: 317 K/uL (ref 150–400)
RBC: 4.32 MIL/uL (ref 4.22–5.81)
RDW: 13.3 % (ref 11.5–15.5)
WBC: 10.9 K/uL — ABNORMAL HIGH (ref 4.0–10.5)
nRBC: 0 % (ref 0.0–0.2)

## 2024-06-16 LAB — BASIC METABOLIC PANEL WITH GFR
Anion gap: 10 (ref 5–15)
BUN: 10 mg/dL (ref 8–23)
CO2: 25 mmol/L (ref 22–32)
Calcium: 8.7 mg/dL — ABNORMAL LOW (ref 8.9–10.3)
Chloride: 101 mmol/L (ref 98–111)
Creatinine, Ser: 0.73 mg/dL (ref 0.61–1.24)
GFR, Estimated: >60 mL/min (ref >60)
Glucose, Bld: 112 mg/dL — ABNORMAL HIGH (ref 70–99)
Potassium: 4.3 mmol/L (ref 3.5–5.1)
Sodium: 136 mmol/L (ref 135–145)

## 2024-06-16 NOTE — Progress Notes (Signed)
   06/16/24 1100  Spiritual Encounters  Type of Visit Initial  Care provided to: Patient  Reason for visit Advance directives  OnCall Visit No    Chaplain responded to consult request Advance Care Directives. The patient stated that he is not interested in this document. Chaplains remain available if needed.   M.Kubra Susanna Kerry Resident [phone]

## 2024-06-16 NOTE — Progress Notes (Signed)
 Occupational Therapy Session Note  Patient Details  Name: Kyle [name] MRN: [id number] Date of Birth: Apr 14, 1935  Today's Date: 06/16/2024 OT Individual Time: 9167-9054 OT Individual Time Calculation (min): 73 min    Short Term Goals: Week 1:  OT Short Term Goal 1 (Week 1): STGs = LTGs  Skilled Therapeutic Interventions/Progress Updates:    Pt received in bed eager to get to the toilet.  Pt sat to EOB with min A on L side of bed to avoid having him push up on L arm.  He then stood up with S and ambulated to toilet with close S. Pt opted to stand to toilet.  He was agreeable to a shower. Covered R forearm IV and doffed sling.   Pt stating sling is uncomfortable and he prefers to just have it off.  It doesn't hurt when I move it.. reinforced precautions and immobility recommendations post clavicle fracture. Also mentioned to his MD, pt's complaints.  Pt then stepped into shower and bathed with S using long sponge to reach under R arm more easily.  He sat on tub bench to bathe and then transferred to chair in bathroom to dry off and dress.  He had pull on pants that were easier to use but he continues to need cues to fully pull pants over L side of hips.   He sat to don socks and shoes without difficulty. He is able to fasten ties on shoes and pants without difficulty.   Pt then wanted to brush teeth and shave. Stood at sink for over 10 min to complete tasks. Pt did well visually to attend with shaving but had great difficulty finding items on sink.  Pt stating it was because he did not recognize items (ie looking for a blue shaver when it was purple).  On further evaluation of vision, pt does have a L visual cut of about 30 degrees. When pt turns head slightly, he is able to see items on his L.   Pt continues to need cues to not push up with L hand or to donn clothing over L arm first.   Pt then ambulated from room around day room with close S (with therapists hand on belt) with no LOB  but cues to scan to the left and cues due to poor L/R directional awareness.  Ambulated over 300 ft. Pt had excellent recall of the unit from when he was here in 2012 when his wife was in rehab.    Pt returned to room,   Pt returned to recliner to rest. Arm supported by pillows.  All needs met.  Call light in reach and alarm set.  Telesitter on.     Therapy Documentation Precautions:  Precautions Precautions: Fall Restrictions Weight Bearing Restrictions Per Provider Order: Yes LUE Weight Bearing Per Provider Order: Non weight bearing Other Position/Activity Restrictions: sling   Pain: Pain Assessment Pain Score: 0-No pain ADL: ADL ADL Comments: supervision bathing with long sponge,  min A UB , supervision LB      Therapy/Group: Individual Therapy  Rubel Heckard 06/16/2024, 12:27 PM

## 2024-06-16 NOTE — Progress Notes (Signed)
 Physical Therapy Session Note  Patient Details  Name: Kyle [name] MRN: [id number] Date of Birth: April 17, 1935  Today's Date: 06/16/2024 PT Individual Time: 8551-8451 PT Individual Time Calculation (min): 60 min   Short Term Goals: Week 1:  PT Short Term Goal 1 (Week 1): STG = LTG due to ELOS Week 2:     Skilled Therapeutic Interventions/Progress Updates:      Therapy Documentation Precautions:  Precautions Precautions: Fall Restrictions Weight Bearing Restrictions Per Provider Order: Yes LUE Weight Bearing Per Provider Order: Non weight bearing Other Position/Activity Restrictions: sling for transfers and ambulation only General:   Vital Signs:   Pain:   Mobility:   Locomotion :    Trunk/Postural Assessment :    Balance:   Exercises:   Other Treatments:      Therapy/Group: Individual Therapy  Kyle [name] PT, DPT, CSRS 06/16/2024, 2:50 PM

## 2024-06-16 NOTE — Progress Notes (Addendum)
 Patient ID: KHANG HANNUM, male   DOB: 04/23/1935, 89 y.o.   MRN: [id number]  Spoke with [facility] who is not prepared to take pt home due to told by Dom-PT he would be here until Sat and they do not have 24/7 care for him. She is thinking he needs more therapies and wonders if needs to go to a SNF from here. Discussed his managed Medicare would probably not cover this plan. Have messaged team and will find out then will call son-Mark since co-worker spoke with Friday and it is somewhat confused to the plan here and discharge date.  12:57 PM According to the team the discharge was changed to Sat 10/11. So will change and confirm with [facility] and Mark-son  1:49 PM Spoke with Lawernce to give update and let know Dad will need 24/7 supervision at discharge for safety and to cue him. He will discuss with family and his plan is to have him come and stay with him if pt will agree. Gave target discharge date of 10/11 and he will work on plan and will update again on Wednesday after team conference. Message chaplain to come back when daughter is here to discuss HCPOA, she will do this

## 2024-06-16 NOTE — Progress Notes (Signed)
 PROGRESS NOTE   Subjective/Complaints:  No issues overnite , left shoulder pain only with pressure , no numbness and tingling in the left hand   ROS-   Pt denies SOB, abd pain, CP, N/V/C/D, and vision changes   Objective:   No results found. Recent Labs    06/16/24 0628  WBC 10.9*  HGB 13.2  HCT 38.7*  PLT 317    Recent Labs    06/16/24 0628  NA 136  K 4.3  CL 101  CO2 25  GLUCOSE 112*  BUN 10  CREATININE 0.73  CALCIUM  8.7*     Intake/Output Summary (Last 24 hours) at 06/16/2024 0944 Last data filed at 06/16/2024 0854 Gross per 24 hour  Intake 240 ml  Output 1400 ml  Net -1160 ml        Physical Exam: Vital Signs Blood pressure 133/71, pulse 75, temperature (!) 97.5 F (36.4 C), temperature source Oral, resp. rate 16, height 5' 7 (1.702 m), weight 63.7 kg, SpO2 97%.   General: No acute distress Mood and affect are appropriate Heart: Regular rate and rhythm no rubs murmurs or extra sounds Lungs: Clear to auscultation, breathing unlabored, no rales or wheezes Abdomen: Positive bowel sounds, soft nontender to palpation, nondistended Extremities: No clubbing, cyanosis, or edema Skin: No evidence of breakdown, no evidence of rash    MSK- LUE in sling NWB- no change Skin: No evidence of breakdown, no evidence of rash Neurologic: Cranial nerves II through XII intact, motor strength is 5/5 in RIght deltoid, bicep, tricep, grip,5/5 B hip flexor, knee extensors, ankle dorsiflexor and plantar flexor LUE 5/5 grip , proximal not tested due to clavicular fracture  Sensory exam normal sensation to light touch  in bilateral upper  Musculoskeletal:LUE in sling , good wrist and finger ROM, No swelling in hand wrist, no tenderness in shoulder  No joint swelling  Drifts to left with gait  Assessment/Plan: 1. Functional deficits which require 3+ hours per day of interdisciplinary therapy in a comprehensive  inpatient rehab setting. Physiatrist is providing close team supervision and 24 hour management of active medical problems listed below. Physiatrist and rehab team continue to assess barriers to discharge/monitor patient progress toward functional and medical goals  Care Tool:  Bathing    Body parts bathed by patient: Chest, Left arm, Abdomen, Front perineal area, Buttocks, Right upper leg, Left upper leg, Right lower leg, Face, Left lower leg, Right arm   Body parts bathed by helper: Right arm     Bathing assist Assist Level: Supervision/Verbal cueing     Upper Body Dressing/Undressing Upper body dressing   What is the patient wearing?: Pull over shirt    Upper body assist Assist Level: Minimal Assistance - Patient > 75%    Lower Body Dressing/Undressing Lower body dressing      What is the patient wearing?: Pants, Incontinence brief     Lower body assist Assist for lower body dressing: Supervision/Verbal cueing     Toileting Toileting    Toileting assist Assist for toileting: Supervision/Verbal cueing     Transfers Chair/bed transfer  Transfers assist     Chair/bed transfer assist level: Supervision/Verbal cueing  Locomotion Ambulation   Ambulation assist      Assist level: Minimal Assistance - Patient > 75% Assistive device: No Device Max distance: 200'   Walk 10 feet activity   Assist     Assist level: Minimal Assistance - Patient > 75% Assistive device: No Device   Walk 50 feet activity   Assist    Assist level: Minimal Assistance - Patient > 75% Assistive device: No Device    Walk 150 feet activity   Assist    Assist level: Minimal Assistance - Patient > 75% Assistive device: No Device    Walk 10 feet on uneven surface  activity   Assist     Assist level: Minimal Assistance - Patient > 75%     Wheelchair     Assist Is the patient using a wheelchair?: No             Wheelchair 50 feet with 2 turns  activity    Assist            Wheelchair 150 feet activity     Assist          Blood pressure 133/71, pulse 75, temperature (!) 97.5 F (36.4 C), temperature source Oral, resp. rate 16, height 5' 7 (1.702 m), weight 63.7 kg, SpO2 97%.  Medical Problem List and Plan: 1. 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  Antithrombotics: -DVT/anticoagulation:  Pharmaceutical: Eliquis             -antiplatelet therapy: N/A 3. Pain Management: Tylenol  prn   10/4- not c/o a lot of pain of LUE-  OK with tylenol    10/5- soreness in LUE/shoulder- con't tylenol  4. Mood/Behavior/Sleep: LCSW to follow for evaluation and support.              --continue Melatonin at bedtime.              -antipsychotic agents: N/A 5. Neuropsych/cognition: This patient is capable of making decisions on his own behalf. 6. Skin/Wound Care: Routine pressure relief measures.  7. Fluids/Electrolytes/Nutrition: Monitor I/O. Check CMET in am.     Latest Ref Rng & Units 06/16/2024    6:28 AM 06/12/2024    9:39 AM 06/11/2024    5:55 AM  BMP  Glucose 70 - 99 mg/dL 887  871  882   BUN 8 - 23 mg/dL 10  11  11    Creatinine 0.61 - 1.24 mg/dL 9.26  9.28  9.28   Sodium 135 - 145 mmol/L 136  134  135   Potassium 3.5 - 5.1 mmol/L 4.3  4.5  4.1   Chloride 98 - 111 mmol/L 101  100  101   CO2 22 - 32 mmol/L 25  26  25    Calcium  8.9 - 10.3 mg/dL 8.7  8.8  8.5     8.  A fib rate controlled: Monitor HR TID. Now on Eliquis.  9. Thrombocytopenia: Platelets down to 102.  --Recheck in ma. 10. Abnormal LFTs: resolved but alb dropping     Latest Ref Rng & Units 06/11/2024    5:55 AM 06/06/2024    9:07  AM 06/05/2024    7:00 PM  Hepatic Function  Total Protein 6.5 - 8.1 g/dL 5.7  6.3  7.2   Albumin 3.5 - 5.0 g/dL 3.0  3.5   4.2   AST 15 - 41 U/L 17  34  53   ALT 0 - 44 U/L 14  22  23    Alk Phosphatase 38 - 126 U/L 44  40  45   Total Bilirubin 0.0 - 1.2 mg/dL 0.9  1.3  1.8   Ensure high-protein twice daily 11.  Mildly comminuted distal left clavicle Fx:  Sling prn for comfort. Tylenol  for pain  May move toward sling only for amb and transfers ok per Ortho PA, f/u with Dr Sharl as OP in 2-3wks 12. HTN: Monitor BP TID--off amlodipine  currently.              --Avoid hypotension. Poor intake recheck BMP in am   10/5- BP running 120's 140's- con't regimen Vitals:   06/15/24 1917 06/16/24 0409  BP: 124/62 133/71  Pulse: 75 75  Resp: 16 16  Temp: (!) 97.4 F (36.3 C) (!) 97.5 F (36.4 C)  SpO2: 98% 97%     Dyslipidemia: LDL- 127. Now on Lipitor 40 mg daily.  13.  Impaired fasting glucose: Hgb A1C- 5.4.    10/4- Last BG on BMP was 128-         LOS: 6 days A FACE TO FACE EVALUATION WAS PERFORMED  Kyle [name] 06/16/2024, 9:44 AM

## 2024-06-16 NOTE — Progress Notes (Signed)
 Speech Language Pathology Daily Session Note  Patient Details  Name: Kyle [name] MRN: [id number] Date of Birth: 09/25/1934  Today's Date: 06/16/2024 SLP Individual Time: 1000-1100 SLP Individual Time Calculation (min): 60 min  Short Term Goals: Week 1: SLP Short Term Goal 1 (Week 1): STGs=LTGs d/t ELOS  Skilled Therapeutic Interventions:   SLP conducted skilled therapy session targeting cognition goals. SLP facilitated crossword task. SLP provided total assist for task breakdown and organization to effectively reduce overall task complexity. Despite these modifications, patient required increased processing time to complete task due to frequent internal distractions and tangential tendencies. Mod to maxA given to read clues, identify target word, and write word in appropriate location. Patient was left in room with call bell in reach and alarm set. SLP will continue to target goals per plan of care.     Pain Pain Assessment Pain Score: 0-No pain  Therapy/Group: Individual Therapy  Ashley A Ellin 06/16/2024, 11:14 AM

## 2024-06-17 NOTE — Progress Notes (Signed)
 Occupational Therapy Session Note  Patient Details  Name: Kyle [name] MRN: [id number] Date of Birth: 03/09/1935  Today's Date: 06/17/2024 OT Individual Time: 9054-8944 OT Individual Time Calculation (min): 70 min    Short Term Goals: Week 1:  OT Short Term Goal 1 (Week 1): STGs = LTGs   Skilled Therapeutic Interventions/Progress Updates:    Pt up in recliner at time of session no pain, and needing to use the bathroom. Pt ambulated throughout session with no AD, LUE sling already on and remained on throughout session. Reiterated NWB precautions and healing process. Toilet transfer, hygiene, and doffing clothing all with distant supervision. Pt needed assist only for brief donning back over hips as it got caught on his pants and unfamiliar with brief. Standing at sink for oral hygiene and grooming tasks. Ambulated room <> ADL apartment with one recovery break but this appeared to be more for conversation instead of fatigue. Focused on problem solving throughout session for home environment, assist with IADLs, etc. Note pt verbose throughout session and difficulty redirecting. Back in room set up alarm on call bell in reach.   Therapy Documentation Precautions:  Precautions Precautions: Fall Restrictions Weight Bearing Restrictions Per Provider Order: Yes LUE Weight Bearing Per Provider Order: Non weight bearing Other Position/Activity Restrictions: sling for transfers and ambulation only    Therapy/Group: Individual Therapy  Chiquita JAYSON Hopping 06/17/2024, 7:26 AM

## 2024-06-17 NOTE — Progress Notes (Signed)
 Physical Therapy Session Note  Patient Details  Name: Kyle [name] MRN: [id number] Date of Birth: 03-20-1935  Today's Date: 06/17/2024 PT Individual Time: 9164-9064 and 1430-1530 PT Individual Time Calculation (min): 60 min and 60 min.  Short Term Goals: Week 1:  PT Short Term Goal 1 (Week 1): STG = LTG due to ELOS  Skilled Therapeutic Interventions/Progress Updates:   First session:  Pt presents supine in bed and agreeable to therapy.  Pt lifts LES to thread pants over feet and then pt pulls up over hips w/ bridge and use of LUE.  Pt states no pain unless pushes on LUE.  Pt educated on NWB to LUE.  Pt transferred supine to sit w/ CGA and cues including holding L hand to avoid WB.  Pt sat EOB for total A donning shoes and then mod A to remove RUE from scrub sleeve and then pt able to perform over head and LUE.  Button-down shirt donned on LUE and then pt completes donning, PT completed buttons.  LUE slng donned w/ total A.  Pt transfers sit to stand w/ supervision.  Pt amb to w/c and wheeled to gym for energy conservation.  Pt amb multiple trials w/o AD and close supervision up to 100' including turn to return to w/c.  Pt negotiated bowling pins obstacle course, hurdle negotiation w/ 1 LOB.  Pt returned to room and amb to recliner.  Pt remained sitting w/ chair alarm on and all needs in reach, Tele sitter active.  Second session:  Pt presents supine in bed and agreeable to therapy.  Pt impulsive and does not wait for cueing to improve sup to sit transfers.  Pt pulls on siderail and requires mod A.  Pt dons shoes w/ total A.  Pt amb to w/c w/ c/s.  Pt wheeled to outdoors at [facility] for NMR on uneven surfaces.  Dtr present during session.  Pt transfers sit to stand from w/c and bench w/ supervision.  Pt requires cues for attaining balance/upright posture before initiating gait.  Pt agreeable but continues to require cues.  Pt amb across uneven surfaces including ramped sidewalk and traversing various  terrain w/o AD and CGA/close sup but does exhibit R sway/meandering gait.  Pt performed forward and backward gait w/ cueing.  Pt returned to room and amb into room from hallway to bed.  Pt required assist to scoot to [facility].  Bed alarm on and all needs in reach, tele sitter active.  LUE supported on pillow for comfort.     Therapy Documentation Precautions:  Precautions Precautions: Fall Restrictions Weight Bearing Restrictions Per Provider Order: Yes LUE Weight Bearing Per Provider Order: Non weight bearing Other Position/Activity Restrictions: sling for transfers and ambulation only General:   Vital Signs:   Pain:0/10     Therapy/Group: Individual Therapy  Reyes SHAUNNA Sierra 06/17/2024, 9:38 AM

## 2024-06-17 NOTE — Progress Notes (Addendum)
 PROGRESS NOTE   Subjective/Complaints:  Family needing extra training prior to d/c, discussed d/c date, no new c/os , sitting EOB with PT   ROS-   Pt denies SOB, abd pain, CP, N/V/C/D, and vision changes   Objective:   No results found. Recent Labs    06/16/24 0628  WBC 10.9*  HGB 13.2  HCT 38.7*  PLT 317    Recent Labs    06/16/24 0628  NA 136  K 4.3  CL 101  CO2 25  GLUCOSE 112*  BUN 10  CREATININE 0.73  CALCIUM  8.7*     Intake/Output Summary (Last 24 hours) at 06/17/2024 0848 Last data filed at 06/17/2024 0804 Gross per 24 hour  Intake 1160 ml  Output 775 ml  Net 385 ml        Physical Exam: Vital Signs Blood pressure 120/73, pulse 67, temperature 98 F (36.7 C), resp. rate 16, height 5' 7 (1.702 m), weight 63.7 kg, SpO2 100%.   General: No acute distress Mood and affect are appropriate Heart: Regular rate and rhythm no rubs murmurs or extra sounds Lungs: Clear to auscultation, breathing unlabored, no rales or wheezes Abdomen: Positive bowel sounds, soft nontender to palpation, nondistended Extremities: No clubbing, cyanosis, or edema Skin: No evidence of breakdown, no evidence of rash    MSK- LUE in sling NWB- no change Skin: No evidence of breakdown, no evidence of rash Neurologic: Cranial nerves II through XII intact, motor strength is 5/5 in RIght deltoid, bicep, tricep, grip,5/5 B hip flexor, knee extensors, ankle dorsiflexor and plantar flexor LUE 5/5 grip , proximal not tested due to clavicular fracture  Sensory exam normal sensation to light touch  in bilateral upper  Musculoskeletal:LUE in sling , good wrist and finger ROM, No swelling in hand wrist, no tenderness in shoulder  No joint swelling  Drifts to left with gait  Assessment/Plan: 1. Functional deficits which require 3+ hours per day of interdisciplinary therapy in a comprehensive inpatient rehab setting. Physiatrist is  providing close team supervision and 24 hour management of active medical problems listed below. Physiatrist and rehab team continue to assess barriers to discharge/monitor patient progress toward functional and medical goals  Care Tool:  Bathing    Body parts bathed by patient: Chest, Left arm, Abdomen, Front perineal area, Buttocks, Right upper leg, Left upper leg, Right lower leg, Face, Left lower leg, Right arm   Body parts bathed by helper: Right arm     Bathing assist Assist Level: Supervision/Verbal cueing     Upper Body Dressing/Undressing Upper body dressing   What is the patient wearing?: Pull over shirt    Upper body assist Assist Level: Minimal Assistance - Patient > 75%    Lower Body Dressing/Undressing Lower body dressing      What is the patient wearing?: Pants, Incontinence brief     Lower body assist Assist for lower body dressing: Supervision/Verbal cueing     Toileting Toileting    Toileting assist Assist for toileting: Supervision/Verbal cueing     Transfers Chair/bed transfer  Transfers assist     Chair/bed transfer assist level: Supervision/Verbal cueing     Locomotion Ambulation  Ambulation assist      Assist level: Minimal Assistance - Patient > 75% Assistive device: No Device Max distance: 200'   Walk 10 feet activity   Assist     Assist level: Minimal Assistance - Patient > 75% Assistive device: No Device   Walk 50 feet activity   Assist    Assist level: Minimal Assistance - Patient > 75% Assistive device: No Device    Walk 150 feet activity   Assist    Assist level: Minimal Assistance - Patient > 75% Assistive device: No Device    Walk 10 feet on uneven surface  activity   Assist     Assist level: Minimal Assistance - Patient > 75%     Wheelchair     Assist Is the patient using a wheelchair?: No             Wheelchair 50 feet with 2 turns activity    Assist             Wheelchair 150 feet activity     Assist          Blood pressure 120/73, pulse 67, temperature 98 F (36.7 C), resp. rate 16, height 5' 7 (1.702 m), weight 63.7 kg, SpO2 100%.  Medical Problem List and Plan: 1. 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  Antithrombotics: -DVT/anticoagulation:  Pharmaceutical: Eliquis             -antiplatelet therapy: N/A 3. Pain Management: Tylenol  prn   10/4- not c/o a lot of pain of LUE-  OK with tylenol    10/5- soreness in LUE/shoulder- con't tylenol  4. Mood/Behavior/Sleep: LCSW to follow for evaluation and support.              --continue Melatonin at bedtime.              -antipsychotic agents: N/A 5. Neuropsych/cognition: This patient is capable of making decisions on his own behalf. 6. Skin/Wound Care: Routine pressure relief measures.  7. Fluids/Electrolytes/Nutrition: Monitor I/O. Check CMET in am.     Latest Ref Rng & Units 06/16/2024    6:28 AM 06/12/2024    9:39 AM 06/11/2024    5:55 AM  BMP  Glucose 70 - 99 mg/dL 887  871  882   BUN 8 - 23 mg/dL 10  11  11    Creatinine 0.61 - 1.24 mg/dL 9.26  9.28  9.28   Sodium 135 - 145 mmol/L 136  134  135   Potassium 3.5 - 5.1 mmol/L 4.3  4.5  4.1   Chloride 98 - 111 mmol/L 101  100  101   CO2 22 - 32 mmol/L 25  26  25    Calcium  8.9 - 10.3 mg/dL 8.7  8.8  8.5     8.  A fib rate controlled: Monitor HR TID. Now on Eliquis.  9. Thrombocytopenia: Platelets down to 102.  --Recheck in ma. 10. Abnormal LFTs: resolved but alb dropping     Latest Ref Rng & Units 06/11/2024    5:55 AM 06/06/2024    9:07 AM 06/05/2024  7:00 PM  Hepatic Function  Total Protein 6.5 - 8.1 g/dL 5.7  6.3  7.2   Albumin 3.5 - 5.0 g/dL 3.0  3.5  4.2   AST 15 - 41 U/L 17  34  53   ALT 0 - 44 U/L 14   22  23    Alk Phosphatase 38 - 126 U/L 44  40  45   Total Bilirubin 0.0 - 1.2 mg/dL 0.9  1.3  1.8   Ensure high-protein twice daily 11.  Mildly comminuted distal left clavicle Fx:  Sling prn for comfort. Tylenol  for pain  May move toward sling only for amb and transfers ok per Ortho PA, f/u with Dr Sharl as OP in 2-3wks 12. HTN: Monitor BP TID--off amlodipine  currently.           Controlled  Vitals:   06/16/24 1958 06/17/24 0534  BP: (!) 109/57 120/73  Pulse: 77 67  Resp: 16 16  Temp: 98.3 F (36.8 C) 98 F (36.7 C)  SpO2: 99% 100%     Dyslipidemia: LDL- 127. Now on Lipitor 40 mg daily.  13.  Impaired fasting glucose: Hgb A1C- 5.4.    10/4- Last BG on BMP was 128-         LOS: 7 days A FACE TO FACE EVALUATION WAS PERFORMED  Kyle [name] 06/17/2024, 8:48 AM

## 2024-06-17 NOTE — Plan of Care (Signed)
  Problem: Consults Goal: RH STROKE PATIENT EDUCATION Description: See Patient Education module for education specifics  Outcome: Progressing   Problem: RH BLADDER ELIMINATION Goal: RH STG MANAGE BLADDER WITH ASSISTANCE Description: STG Manage Bladder With mod I Assistance Outcome: Progressing Goal: RH STG MANAGE BLADDER WITH MEDICATION WITH ASSISTANCE Description: STG Manage Bladder With Medication With Assistance. Outcome: Progressing   Problem: RH KNOWLEDGE DEFICIT Goal: RH STG INCREASE KNOWLEDGE OF HYPERTENSION Description: Patient and family will be able to manage HTN using educational resources for medication and dietary modification independently Outcome: Progressing Goal: RH STG INCREASE KNOWLEGDE OF HYPERLIPIDEMIA Description: Patient and family will be able to manage HLD using educational resources for medication and dietary modification independently Outcome: Progressing Goal: RH STG INCREASE KNOWLEDGE OF STROKE PROPHYLAXIS Description: Patient and family will be able to manage secondary risk using educational resources for medication and dietary modification independently Outcome: Progressing

## 2024-06-18 NOTE — Plan of Care (Signed)
 Problem: RH Balance Goal: LTG Patient will maintain dynamic standing with ADLs (OT) Description: LTG:  Patient will maintain dynamic standing balance with assist during activities of daily living (OT)  Flowsheets (Taken 06/18/2024 1019) LTG: Pt will maintain dynamic standing balance during ADLs with: (LTGs downgraded due to decreased safety awareness and L visual field cut/ L inattention.) Supervision/Verbal cueing Note: LTGs downgraded due to decreased safety awareness and L visual field cut/ L inattention.    Problem: Sit to Stand Goal: LTG:  Patient will perform sit to stand in prep for activites of daily living with assistance level (OT) Description: LTG:  Patient will perform sit to stand in prep for activites of daily living with assistance level (OT) Flowsheets (Taken 06/18/2024 1019) LTG: PT will perform sit to stand in prep for activites of daily living with assistance level: (LTGs downgraded due to decreased safety awareness and L visual field cut/ L inattention.) Supervision/Verbal cueing Note: LTGs downgraded due to decreased safety awareness and L visual field cut/ L inattention.    Problem: RH Bathing Goal: LTG Patient will bathe all body parts with assist levels (OT) Description: LTG: Patient will bathe all body parts with assist levels (OT) Flowsheets (Taken 06/18/2024 1019) LTG: Pt will perform bathing with assistance level/cueing: (LTGs downgraded due to decreased safety awareness and L visual field cut/ L inattention.) Supervision/Verbal cueing Note: LTGs downgraded due to decreased safety awareness and L visual field cut/ L inattention.    Problem: RH Dressing Goal: LTG Patient will perform upper body dressing (OT) Description: LTG Patient will perform upper body dressing with assist, with/without cues (OT). Flowsheets (Taken 06/18/2024 1019) LTG: Pt will perform upper body dressing with assistance level of: (LTGs downgraded due to decreased safety awareness and L visual  field cut/ L inattention.) Supervision/Verbal cueing Note: LTGs downgraded due to decreased safety awareness and L visual field cut/ L inattention.  Goal: LTG Patient will perform lower body dressing w/assist (OT) Description: LTG: Patient will perform lower body dressing with assist, with/without cues in positioning using equipment (OT) Flowsheets (Taken 06/18/2024 1019) LTG: Pt will perform lower body dressing with assistance level of: (LTGs downgraded due to decreased safety awareness and L visual field cut/ L inattention.) Supervision/Verbal cueing Note: LTGs downgraded due to decreased safety awareness and L visual field cut/ L inattention.    Problem: RH Toileting Goal: LTG Patient will perform toileting task (3/3 steps) with assistance level (OT) Description: LTG: Patient will perform toileting task (3/3 steps) with assistance level (OT)  Flowsheets (Taken 06/18/2024 1019) LTG: Pt will perform toileting task (3/3 steps) with assistance level: (LTGs downgraded due to decreased safety awareness and L visual field cut/ L inattention.) Supervision/Verbal cueing Note: LTGs downgraded due to decreased safety awareness and L visual field cut/ L inattention.    Problem: RH Simple Meal Prep Goal: LTG Patient will perform simple meal prep w/assist (OT) Description: LTG: Patient will perform simple meal prep with assistance, with/without cues (OT). Flowsheets (Taken 06/18/2024 1019) LTG: Pt will perform simple meal prep with assistance level of: (LTGs downgraded due to decreased safety awareness and L visual field cut/ L inattention.) Minimal Assistance - Patient > 75% Note: LTGs downgraded due to decreased safety awareness and L visual field cut/ L inattention.    Problem: RH Toilet Transfers Goal: LTG Patient will perform toilet transfers w/assist (OT) Description: LTG: Patient will perform toilet transfers with assist, with/without cues using equipment (OT) Flowsheets (Taken 06/18/2024  1019) LTG: Pt will perform toilet transfers with assistance  level of: (LTGs downgraded due to decreased safety awareness and L visual field cut/ L inattention.) Supervision/Verbal cueing Note: LTGs downgraded due to decreased safety awareness and L visual field cut/ L inattention.

## 2024-06-18 NOTE — Progress Notes (Signed)
 Patient ID: Kyle [name], male   DOB: [birth year], 89 y.o.   MRN: [id number]  Met with pt and spoke with son via telephone to discuss team conference with goals if supervision level for safety and visual issues. Son reports he will have 24/7 supervision and is aware his Dad will do more at home then he is doing here, due to it is pt's personality. Pt will be ready to go on Sat 10/11 and this is our discharge date. Son reports his wife and son will be here tomorrow and see him in therapies. Pt has all needed equipment and follow up home health has been in place. Will see daughter in-law and grandson tomorrow when here.

## 2024-06-18 NOTE — Plan of Care (Signed)
  Problem: Consults Goal: RH STROKE PATIENT EDUCATION Description: See Patient Education module for education specifics  Outcome: Progressing   Problem: RH BLADDER ELIMINATION Goal: RH STG MANAGE BLADDER WITH ASSISTANCE Description: STG Manage Bladder With mod I Assistance Outcome: Progressing Goal: RH STG MANAGE BLADDER WITH MEDICATION WITH ASSISTANCE Description: STG Manage Bladder With Medication With Assistance. Outcome: Progressing   Problem: RH KNOWLEDGE DEFICIT Goal: RH STG INCREASE KNOWLEDGE OF HYPERTENSION Description: Patient and family will be able to manage HTN using educational resources for medication and dietary modification independently Outcome: Progressing Goal: RH STG INCREASE KNOWLEGDE OF HYPERLIPIDEMIA Description: Patient and family will be able to manage HLD using educational resources for medication and dietary modification independently Outcome: Progressing Goal: RH STG INCREASE KNOWLEDGE OF STROKE PROPHYLAXIS Description: Patient and family will be able to manage secondary risk using educational resources for medication and dietary modification independently Outcome: Progressing

## 2024-06-18 NOTE — Progress Notes (Signed)
 Occupational Therapy Session Note  Patient Details  Name: Kyle [name] MRN: [id number] Date of Birth: [birth year]  Today's Date: 06/18/2024 OT Individual Time: 1445-1536 OT Individual Time Calculation (min): 51 min    Short Term Goals: Week 1:  OT Short Term Goal 1 (Week 1): STGs = LTGs  Skilled Therapeutic Interventions/Progress Updates:      Therapy Documentation Precautions:  Precautions Precautions: Fall Restrictions Weight Bearing Restrictions Per Provider Order: Yes LUE Weight Bearing Per Provider Order: Non weight bearing Other Position/Activity Restrictions: sling for transfers and ambulation only General: Direct handoff from PT, agreeable to therapy session.   Pain: no pain reported  ADL: OT providing skilled intervention on functional mobility retraining in order to increase independence with tasks and increase activity tolerance. Pt completed ambulating from room><therapy gym with cane at SBA with no LOB/SOB, able to make quick turns without LOB.  Exercises: Pt completed 10 minutes of nu step bike in order to increase BUE/BLEstrength and endurance in preparation for increased independence in ADLs. Pt using BLE and RUE, LUE in shoulder sling not being used for exercise. Rest break after 5 min, on level 5 resistance.  Other Treatments: OT providing skilled intervention for cognition with pattern recognition and sequencing. Pt able to complete sequencing numbers backward from 10-1. Pt able to complete retrieving items with RUE with no difficulty.    Pt seated in recliner at end of session with W/C alarm donned, call light within reach and 4Ps assessed.    Therapy/Group: Individual Therapy  Camie Hoe, OTD, OTR/L 06/18/2024, 3:44 PM

## 2024-06-18 NOTE — Progress Notes (Signed)
 PROGRESS NOTE   Subjective/Complaints:  BM yesterday not taking pain meds , minimal use of Tylenol    ROS-   Pt denies SOB, abd pain, CP, N/V/C/D, and vision changes   Objective:   No results found. Recent Labs    06/16/24 0628  WBC 10.9*  HGB 13.2  HCT 38.7*  PLT 317    Recent Labs    06/16/24 0628  NA 136  K 4.3  CL 101  CO2 25  GLUCOSE 112*  BUN 10  CREATININE 0.73  CALCIUM  8.7*     Intake/Output Summary (Last 24 hours) at 06/18/2024 0908 Last data filed at 06/18/2024 0906 Gross per 24 hour  Intake 920 ml  Output 1325 ml  Net -405 ml        Physical Exam: Vital Signs Blood pressure 121/74, pulse 78, temperature 98.6 F (37 C), temperature source Oral, resp. rate 16, height 5' 7 (1.702 m), weight 63.7 kg, SpO2 94%.   General: No acute distress Mood and affect are appropriate Heart: Regular rate and rhythm no rubs murmurs or extra sounds Lungs: Clear to auscultation, breathing unlabored, no rales or wheezes Abdomen: Positive bowel sounds, soft nontender to palpation, nondistended Extremities: No clubbing, cyanosis, or edema Skin: No evidence of breakdown, no evidence of rash    MSK- LUE in sling NWB- no change Skin: No evidence of breakdown, no evidence of rash Neurologic: Cranial nerves II through XII intact, motor strength is 5/5 in RIght deltoid, bicep, tricep, grip,5/5 B hip flexor, knee extensors, ankle dorsiflexor and plantar flexor LUE 5/5 grip , proximal not tested due to clavicular fracture  Sensory exam normal sensation to light touch  in bilateral upper  Musculoskeletal:LUE in sling , good wrist and finger ROM, No swelling in hand wrist, no tenderness in shoulder  No joint swelling  Drifts to left with gait  Assessment/Plan: 1. Functional deficits which require 3+ hours per day of interdisciplinary therapy in a comprehensive inpatient rehab setting. Physiatrist is providing  close team supervision and 24 hour management of active medical problems listed below. Physiatrist and rehab team continue to assess barriers to discharge/monitor patient progress toward functional and medical goals  Care Tool:  Bathing    Body parts bathed by patient: Chest, Left arm, Abdomen, Front perineal area, Buttocks, Right upper leg, Left upper leg, Right lower leg, Face, Left lower leg, Right arm   Body parts bathed by helper: Right arm     Bathing assist Assist Level: Supervision/Verbal cueing     Upper Body Dressing/Undressing Upper body dressing   What is the patient wearing?: Pull over shirt    Upper body assist Assist Level: Minimal Assistance - Patient > 75%    Lower Body Dressing/Undressing Lower body dressing      What is the patient wearing?: Pants, Incontinence brief     Lower body assist Assist for lower body dressing: Supervision/Verbal cueing     Toileting Toileting    Toileting assist Assist for toileting: Supervision/Verbal cueing     Transfers Chair/bed transfer  Transfers assist     Chair/bed transfer assist level: Supervision/Verbal cueing     Locomotion Ambulation   Ambulation assist  Assist level: Contact Guard/Touching assist Assistive device: No Device Max distance: 250   Walk 10 feet activity   Assist     Assist level: Contact Guard/Touching assist Assistive device: No Device   Walk 50 feet activity   Assist    Assist level: Contact Guard/Touching assist Assistive device: No Device    Walk 150 feet activity   Assist    Assist level: Contact Guard/Touching assist Assistive device: No Device    Walk 10 feet on uneven surface  activity   Assist     Assist level: Contact Guard/Touching assist Assistive device: Other (comment) (no device outdoors.)   Wheelchair     Assist Is the patient using a wheelchair?: No             Wheelchair 50 feet with 2 turns activity    Assist             Wheelchair 150 feet activity     Assist          Blood pressure 121/74, pulse 78, temperature 98.6 F (37 C), temperature source Oral, resp. rate 16, height 5' 7 (1.702 m), weight 63.7 kg, SpO2 94%.  Medical Problem List and Plan: 1. 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  Antithrombotics: -DVT/anticoagulation:  Pharmaceutical: Eliquis             -antiplatelet therapy: N/A 3. Pain Management: Tylenol  prn   10/4- not c/o a lot of pain of LUE-  OK with tylenol    10/5- soreness in LUE/shoulder- con't tylenol  4. Mood/Behavior/Sleep: LCSW to follow for evaluation and support.              --continue Melatonin at bedtime.              -antipsychotic agents: N/A 5. Neuropsych/cognition: This patient is capable of making decisions on his own behalf. 6. Skin/Wound Care: Routine pressure relief measures.  7. Fluids/Electrolytes/Nutrition: Monitor I/O. Check CMET in am.     Latest Ref Rng & Units 06/16/2024    6:28 AM 06/12/2024    9:39 AM 06/11/2024    5:55 AM  BMP  Glucose 70 - 99 mg/dL 887  871  882   BUN 8 - 23 mg/dL 10  11  11    Creatinine 0.61 - 1.24 mg/dL 9.26  9.28  9.28   Sodium 135 - 145 mmol/L 136  134  135   Potassium 3.5 - 5.1 mmol/L 4.3  4.5  4.1   Chloride 98 - 111 mmol/L 101  100  101   CO2 22 - 32 mmol/L 25  26  25    Calcium  8.9 - 10.3 mg/dL 8.7  8.8  8.5     8.  A fib rate controlled: Monitor HR TID. Now on Eliquis.  9. Thrombocytopenia: Platelets down to 102.  --Recheck in ma. 10. Abnormal LFTs: resolved but alb dropping     Latest Ref Rng & Units 06/11/2024    5:55 AM 06/06/2024    9:07 AM 06/05/2024    7:00 PM  Hepatic Function  Total Protein 6.5 - 8.1 g/dL 5.7  6.3  7.2   Albumin 3.5 - 5.0 g/dL 3.0  3.5  4.2   AST 15 - 41  U/L 17  34  53   ALT 0 - 44 U/L 14  22  23    Alk Phosphatase 38 - 126 U/L 44  40  45   Total Bilirubin 0.0 - 1.2 mg/dL 0.9  1.3  1.8   Ensure high-protein twice daily 11.  Mildly comminuted distal left clavicle Fx:  Sling prn for comfort. Tylenol  for pain  May move toward sling only for amb and transfers ok per Ortho PA, f/u with Dr Sharl as OP in 2-3wks 12. HTN: Monitor BP TID--off amlodipine  currently.           Controlled  Vitals:   06/17/24 2016 06/18/24 0540  BP: (!) 145/67 121/74  Pulse: 74 78  Resp: 17 16  Temp: 98.2 F (36.8 C) 98.6 F (37 C)  SpO2: 98% 94%     Dyslipidemia: LDL- 127. Now on Lipitor 40 mg daily.  13.  Impaired fasting glucose: Hgb A1C- 5.4.    10/4- Last BG on BMP was 128-         LOS: 8 days A FACE TO FACE EVALUATION WAS PERFORMED  Prentice FORBES Compton 06/18/2024, 9:08 AM

## 2024-06-18 NOTE — Progress Notes (Signed)
 Occupational Therapy Session Note  Patient Details  Name: Kyle [name] MRN: [id number] Date of Birth: 1935-01-31  Today's Date: 06/18/2024 OT Individual Time: 0915-1000 OT Individual Time Calculation (min): 45 min    Short Term Goals: Week 1:  OT Short Term Goal 1 (Week 1): STGs = LTGs  Skilled Therapeutic Interventions/Progress Updates:    Pt received in bed ready for therapy. He stated the NT helped him to wash up and get dressed this morning.  NT confirmed.   Pt stated he sleeps in a queen bed at home and can get up on the R side of the bed.  Had pt work on Bed mobility 3x with cues to fully roll onto R side with bent knees and to roll L shoulder over to enable him to push up with R arm more easily.  Pt able to do this with S.   Pt requested to brush teeth and shave.  He ambulated to sink and stood for over 10 min to complete oral care  and shaving.  Pt had difficulty finding items on L side of sink or identifying that the hot water needed to be turned off. Pt unable to find but with cues to turn his chin to the L he was able to find items.    To continue to work on L side awareness, had pt do ambulation with cane down to ADL apt.  Pt ambulated with S and did fairly well with scanning L to R looking ahead at a distance and seeing obstacles on his L.   Sit to stands from low couch with S.      Discussed using light house method for visual scanning at home and here while finishing up his rehab. Pt returned to room to sit in recliner with all needs met, alarm on. Telesitter on.    Therapy Documentation Precautions:  Precautions Precautions: Fall Restrictions Weight Bearing Restrictions Per Provider Order: Yes LUE Weight Bearing Per Provider Order: Non weight bearing Other Position/Activity Restrictions: sling for transfers and ambulation only   Pain: Pain Assessment Pain Scale: 0-10 Pain Score: 0-No pain ADL: ADL ADL Comments: supervision bathing with long sponge,  min A UB ,  supervision LB     Therapy/Group: Individual Therapy  Kyle [name] 06/18/2024, 10:09 AM

## 2024-06-18 NOTE — Progress Notes (Signed)
 Occupational Therapy Session Note  Patient Details  Name: Kyle [name] MRN: [id number] Date of Birth: Sep 28, 1934  Today's Date: 06/18/2024 OT Individual Time: 8893-8840 OT Individual Time Calculation (min): 53 min    Short Term Goals: Week 1:  OT Short Term Goal 1 (Week 1): STGs = LTGs  Skilled Therapeutic Interventions/Progress Updates:    Pt received resting in recliner presenting to be in good spirits receptive to skilled OT session reporting 0/10 pain- OT offering intermittent rest breaks, repositioning, and therapeutic support to optimize participation in therapy session. Pt completed functional mobility training to gym while engaging in dual tasking by ambulating to gym while carrying on conversation with OT, improvement from last week with Pt able to continue conversation without pausing ambulation. Focused this session visual scanning, dynamic standing balance, and attention to L visual field secondary to visual field cut to improve safety when completing ADLs/IADLs. Completed vision task by challenging Pt to retrieve items from cabinets in the kitchen with CGA for safety while standing at the counter and increased time to locate items on L side. Pt completed kitchen task by taking dishes and loading them into the dishwasher with CGA for safety and MIN cues to turn head towards L side to find items. Pt completed functional mobility utilizing single point cane to rehab gym with CGA for safety. Engaged Pt in visual scanning activity on BITS, completed 2 trials of tapping moving targets on screen to challenge L attention.   Trial 1: 88.73% accuracy 2 min duration 1.90 sec reaction time 63 hits  Trial 2:  98.8% accuracy 2 min duration 1.46 sec reaction time 82 hits  Graded up activity to have Pt engage in Trail making task, requiring Pt to draw a line in of sequence of numbers 1-25 spread out on the screen to challenge higher level visual scanning/dual tasking.   Duration: 3 min and  30 sec 6 errors 24 interruptions  Completed functional mobility training back to room utilizing single point cane with CGA for safety. Pt was left resting in recliner with call bell in reach, seatbelt alarm on, and all needs met.    Therapy Documentation Precautions:  Precautions Precautions: Fall Restrictions Weight Bearing Restrictions Per Provider Order: Yes LUE Weight Bearing Per Provider Order: Non weight bearing Other Position/Activity Restrictions: sling for transfers and ambulation only General:   Vital Signs: Therapy Vitals Temp: 99.1 F (37.3 C) Pulse Rate: 85 Resp: 18 BP: 111/74 Patient Position (if appropriate): Lying Oxygen Therapy SpO2: 98 % O2 Device: Room Air Pain: Pain Assessment Pain Score: 0-No pain ADL: ADL ADL Comments: supervision bathing with long sponge,  min A UB , supervision LB Vision   Perception    Praxis   Balance   Exercises:   Other Treatments:     Therapy/Group: Individual Therapy  [name] 06/18/2024, 12:59 PM

## 2024-06-18 NOTE — Progress Notes (Signed)
 Speech Language Pathology Daily Session Note  Patient Details  Name: Kyle [name] MRN: [id number] Date of Birth: 16-Jul-1935  Today's Date: 06/18/2024 SLP Individual Time: 9198-9153 SLP Individual Time Calculation (min): 45 min  Short Term Goals: Week 1: SLP Short Term Goal 1 (Week 1): STGs=LTGs d/t ELOS  Skilled Therapeutic Interventions:  Patient was seen in am to address cognitive re- training. Pt was alert and seated upright in bed upon SLP arrival. He denied pain and was agreeable for session. Pt was indep oriented to time, place, and situation. Pt with reduced awareness of cognitive deficits post CVA. SLP challenging pt in medication management tasks this date. He indep identified 2 medications currently taking and an additional 2 given min A with a total of 5 pills. SLP challenging pt's attention, problem solving, and memory through completion of a BID pill box representing current medication recommendations. Pt warranting mod A to interpret prescription directions and mod A for working memory once comprehension gleaned. Pt making several errors with reduced awareness. SLP recommending pt receive total A post discharge with medication management.  Pt sustained attention initially requiring intermittent redirection with greater frequency of cues required as task progressed.  Intermittently throughout session, SLP challenging pt safety awareness in current environment. He indep identified call button though continues to present with reduced awareness of recommendations for calling before standing even with max A. Pt left upright in bed with call button within reach and bed alarm active. SLP to continue POC.   Pain Pain Assessment Pain Scale: 0-10 Pain Score: 0-No pain  Therapy/Group: Individual Therapy  Joane GORMAN Fuss 06/18/2024, 8:04 AM

## 2024-06-18 NOTE — Progress Notes (Signed)
 Physical Therapy Weekly Progress Note  Patient Details  Name: Kyle [name] MRN: [id number] Date of Birth: 1934-12-29  Beginning of progress report period: June 11, 2024 End of progress report period: June 18, 2024  No STG's placed due to slight increase in pt's ELOS. Pt requires supervision to minA for bed mobility (fluctuates) and overall supervision for transfers and ambulation with SPC with pt improving L sided attention. Pt family set to provide supervision at home under the recommendation of pt's rehab team. Pt is making functional progress towards LTG's  Patient continues to demonstrate the following deficits muscle weakness and muscle joint tightness, decreased cardiorespiratoy endurance, unbalanced muscle activation, decreased attention, decreased awareness, decreased problem solving, decreased safety awareness, and decreased memory, and decreased standing balance, decreased postural control, hemiplegia, and decreased balance strategies and therefore will continue to benefit from skilled PT intervention to increase functional independence with mobility.  Patient progressing toward long term goals..  Continue plan of care.  PT Short Term Goals Week 2:  PT Short Term Goal 1 (Week 2): STG = LTG due to ELOS  Therapy Documentation Precautions:  Precautions Precautions: Fall Restrictions Weight Bearing Restrictions Per Provider Order: Yes LUE Weight Bearing Per Provider Order: Non weight bearing Other Position/Activity Restrictions: sling for transfers and ambulation only  Dominic Sandoval PTA   06/18/2024, 8:01 AM

## 2024-06-18 NOTE — Progress Notes (Signed)
 Physical Therapy Session Note  Patient Details  Name: Kyle [name] MRN: [id number] Date of Birth: 1935-05-31  Today's Date: 06/18/2024 PT Individual Time: 1400-1455 PT Individual Time Calculation (min): 55 min   Short Term Goals: Week 1:  PT Short Term Goal 1 (Week 1): STG = LTG due to ELOS  Skilled Therapeutic Interventions/Progress Updates: Patient supine in bed on entrance to room. Patient alert and agreeable to PT session.   Patient reported no pain.  Therapeutic Activity: Bed Mobility: Pt performed supine<sit on EOB with supervision and VC to avoid using L UE to assist due to NWB status.  Transfers: Pt performed sit<>stand transfers throughout session with supervision in preparation for functional mobility.    - Pt ambulated throughout session (200'+) with R SPC and overall supervision and decreased attention to L but has improved since evaluation.  PTA adjusted L sling with elastic strap to keep UE close to torso. Pt ambulated around main gym following donning of strap with noted improvement of UE staying inside sling.  - Pt performed step ups to 4 aerobic step to simulate stepping into home threshold with CGA/light minA due to posterior lean when stepping up with VC to shift weight anteriorly. Pt with seated rest breaks and performed mass practice.   Therapeutic Exercise: Pt performed the following exercises with therapist providing the described cuing and facilitation for improvement. - NuStep; 0.3 miles; 455 steps; 48 avg SPM; 1.8 METs; level 3 resistance for 8 min to increase cardiovascular endurance.   Patient sitting in recliner at end of session with brakes locked, belt alarm set, and all needs within reach.      Therapy Documentation Precautions:  Precautions Precautions: Fall Restrictions Weight Bearing Restrictions Per Provider Order: Yes LUE Weight Bearing Per Provider Order: Non weight bearing Other Position/Activity Restrictions: sling for transfers and  ambulation only  Therapy/Group: Individual Therapy  Dequandre Cordova PTA 06/18/2024, 3:12 PM

## 2024-06-19 LAB — BASIC METABOLIC PANEL WITH GFR
Anion gap: 9 (ref 5–15)
BUN: 14 mg/dL (ref 8–23)
CO2: 25 mmol/L (ref 22–32)
Calcium: 8.6 mg/dL — ABNORMAL LOW (ref 8.9–10.3)
Chloride: 101 mmol/L (ref 98–111)
Creatinine, Ser: 0.8 mg/dL (ref 0.61–1.24)
GFR, Estimated: >60 mL/min (ref >60)
Glucose, Bld: 106 mg/dL — ABNORMAL HIGH (ref 70–99)
Potassium: 4 mmol/L (ref 3.5–5.1)
Sodium: 135 mmol/L (ref 135–145)

## 2024-06-19 LAB — CBC
HCT: 35.5 % — ABNORMAL LOW (ref 39.0–52.0)
Hemoglobin: 12.1 g/dL — ABNORMAL LOW (ref 13.0–17.0)
MCH: 30.6 pg (ref 26.0–34.0)
MCHC: 34.1 g/dL (ref 30.0–36.0)
MCV: 89.6 fL (ref 80.0–100.0)
Platelets: 307 K/uL (ref 150–400)
RBC: 3.96 MIL/uL — ABNORMAL LOW (ref 4.22–5.81)
RDW: 13.3 % (ref 11.5–15.5)
WBC: 9.3 K/uL (ref 4.0–10.5)
nRBC: 0 % (ref 0.0–0.2)

## 2024-06-19 NOTE — Plan of Care (Signed)
  Problem: Consults Goal: RH STROKE PATIENT EDUCATION Description: See Patient Education module for education specifics  Outcome: Progressing   Problem: RH BLADDER ELIMINATION Goal: RH STG MANAGE BLADDER WITH ASSISTANCE Description: STG Manage Bladder With mod I Assistance Outcome: Progressing Goal: RH STG MANAGE BLADDER WITH MEDICATION WITH ASSISTANCE Description: STG Manage Bladder With Medication With Assistance. Outcome: Progressing   Problem: RH KNOWLEDGE DEFICIT Goal: RH STG INCREASE KNOWLEDGE OF HYPERTENSION Description: Patient and family will be able to manage HTN using educational resources for medication and dietary modification independently Outcome: Progressing Goal: RH STG INCREASE KNOWLEGDE OF HYPERLIPIDEMIA Description: Patient and family will be able to manage HLD using educational resources for medication and dietary modification independently Outcome: Progressing Goal: RH STG INCREASE KNOWLEDGE OF STROKE PROPHYLAXIS Description: Patient and family will be able to manage secondary risk using educational resources for medication and dietary modification independently Outcome: Progressing

## 2024-06-19 NOTE — Group Note (Signed)
 Patient Details Name: DEREL MCGLASSON MRN: [id number] DOB: [birth year] Today's Date: 06/19/2024  Time Calculation:   PT Group Time Calculation PT Group Start Time: 1330 PT Group Stop Time: 1430 PT Group Time Calculation (min): 60 min    Group Description: Music and movement: Pt participated in music and movement group with an emphasis on social interaction, motor planning, increasing overall activity tolerance and bimanual tasks. All songs were selected by group members. Dance moves included AROM of BUE/BLE gross motor movements with an emphasis on building functional endurance.   Individual level documentation: Patient completed group from sitting level. Patientt needed min to complete various dance moves with cues for initiation of task and attention to task.  Patient needed min modifications during group due to L clavicle fx.  Pain:    Precautions:    Shanieka Blea 06/19/2024, 2:55 PM

## 2024-06-19 NOTE — Progress Notes (Signed)
 Physical Therapy Session Note  Patient Details  Name: Kyle [name] MRN: [id number] Date of Birth: 1935/06/05  Today's Date: 06/19/2024 PT Individual Time: 8493-8461 PT Individual Time Calculation (min): 32 min   Short Term Goals: Week 1:  PT Short Term Goal 1 (Week 1): STG = LTG due to ELOS  Skilled Therapeutic Interventions/Progress Updates: Patient supine in bed with daughter present on entrance to room. Patient alert and agreeable to PT session.   Patient reported no pain.  Therapeutic Activity: Bed Mobility: Pt performed supine<>sit on EOB with supervision after pt following cue to use R UE to assist with truncal elevation, and B LE's as leverage on EOB. Transfers: Pt performed sit<>stand transfers throughout session with supervision and SPC.  pt ambulated from room<>day room gym and required close supervision/CGA due to decreased L attention and gait deviations leading to slight scissor pattern on occasion (pt with SPC in R UE and VC to increase attention to L and decreasing cadence to focus on step pattern). Session focused on pt and pt daughter education on pt's safe d/c home, pt's deficits of L inattention, requiring close supervision/CGA when ambulating due to  presentation during session. PTA continued that pt will need longer than 5 days of supervision (pt daughter felt pt will improve at home and PTA agreed that hospital stays can disorient  pt greater vs being at home, but that pt will still need help due to L UE NWB status and pt's decreased overall cognition/safety awareness and that [facility] therapy will be further assisting after d/c).   Patient supine in bed at end of session with brakes locked, family present, bed alarm set, and all needs within reach.      Therapy Documentation Precautions:  Precautions Precautions: Fall Restrictions Weight Bearing Restrictions Per Provider Order: Yes LUE Weight Bearing Per Provider Order: Non weight bearing Other Position/Activity  Restrictions: sling for transfers and ambulation only  Therapy/Group: Individual Therapy  Catriona Dillenbeck PTA 06/19/2024, 4:37 PM

## 2024-06-19 NOTE — Patient Care Conference (Signed)
 Inpatient RehabilitationTeam Conference and Plan of Care Update Date: 06/19/2024   Time: 10:16 AM    Patient Name: Kyle [name]      Medical Record Number: [id number]  Date of Birth: [birth year] Sex: Male         Room/Bed: 4W03C/4W03C-01 Payor Info: Payor: [employer]vertising copy[employer] MEDICARE / Plan: [facility] MEDICARE / Product Type: *No Product type* /    Admit Date/Time:  06/10/2024  2:33 PM  Primary Diagnosis:  Stroke of right basal ganglia [facility])  Hospital Problems: Principal Problem:   Stroke of right basal ganglia [facility])    Expected Discharge Date: Expected Discharge Date: 06/21/24  Team Members Present: Physician leading conference: Dr. Prentice Compton Social Worker Present: Rhoda Clement, LCSW Nurse Present: Barnie Ronde, RN PT Present: Delinda Bertrand, PTA OT Present: Recardo Maxwell, OT SLP Present: Recardo Mole, SLP     Current Status/Progress Goal Weekly Team Focus  Bowel/Bladder   Patient is continent of bowel and bladder. Last BM 10/5   Patient will remain continent of bowel and bladder   Assist with toileting as needed. Ensure urinal is within reach.    Swallow/Nutrition/ Hydration               ADL's   close S with self care and mod cues with L visual attention, pt close to meeting goals,  CGA with shower stall transfers   supervision overall   ADL training, balance, L side awareness, balance, pt/fam education    Mobility   CGA/minA bed mobility, close supervision transfers and gait with/without AD close suprvision (R sway and decreased L sided attention).   modI but need to downgrade to overall supervision  Compliance with NWB L UE, dynamic standing balance, gait with cane, family/pt education, L sided awareness    Communication                Safety/Cognition/ Behavioral Observations  severe sustained attention and safety awareness deficits, mild STM, processing, and L visual attention deficits - family unavailable to confirm baseline (pt reported IADLs at  baseline)   minA   pt/family education, cognitive retraining, compensatory strategies    Pain   Patient denies pain.   Patient will remain free from pain or have a pain score less than 4/10.   Assess pain every shift and as needed. Provide pain interventions when necessary.    Skin   Abrasion to left elbow. Open to air. Scabbed.   Continued healing of wound on left elbow. Patient will remain free of further skin breakdown.  Assess skin every shift and as needed.      Discharge Planning:  Son and daughter coming up with plan-aware pt will need 24 hr supervision for safety and some assist. Aware DC planned for Sat 10/11. Working Chief Strategy Officer Discussion: Patient post right basal ganglia CVA with left clavicle fracture post fall with sling on arm. Continued Eliquis for A-fib. Limited by his left in-attention and poor safety awareness; with poor insight and sundowning.   Patient on target to meet rehab goals: yes, currently needs min assist for bed mobility and cues for sequencing and safety.  Working on severe in attention, and STMD. Goals for discharge set for supervision - mod I assist.  *See Care Plan and progress notes for long and short-term goals.   Revisions to Treatment Plan:  N/a   Teaching Needs: Safety, medications, transfers, toileting, etc.   Current Barriers to Discharge: Decreased caregiver support and Home enviroment access/layout  Possible  Resolutions to Barriers: Family education HH follow up services     Medical Summary Current Status: clavicualr pain improving , mild cognitive deficits  Barriers to Discharge: Other (comments)   Possible Resolutions to [employer] Focus: family training needed prior to d/c   Continued Need for Acute Rehabilitation Level of Care: The patient requires daily medical management by a physician with specialized training in physical medicine and rehabilitation for the following reasons: Direction of a multidisciplinary  physical rehabilitation program to maximize functional independence : Yes Medical management of patient stability for increased activity during participation in an intensive rehabilitation regime.: Yes Analysis of laboratory values and/or radiology reports with any subsequent need for medication adjustment and/or medical intervention. : Yes   I attest that I was present, lead the team conference, and concur with the assessment and plan of the team.   Fredericka Sober B 06/19/2024, 11:02 AM

## 2024-06-19 NOTE — Progress Notes (Signed)
 Speech Language Pathology Discharge Summary  Patient Details  Name: RISHAWN WALCK MRN: [id number] Date of Birth: Nov 29, 1934  Date of Discharge from SLP service:{Time; dates multiple:[id number]}  Patient has met 4 of 4 long term goals.  Patient to discharge at overall Min;Mod level.  Reasons goals not met: n/a   Clinical Impression/Discharge Summary:   Patient has made excellent progress across therapy admission meeting 4/4 long term goals set for duration of CIR stay. Patient currently benefits from min assist for sustained attention, min assist for memory of daily events, mod assist for intellectual awareness of deficits, and min to mod assist for completion of functional problem solving tasks. Patient would benefit from ongoing SLP services to target lingering deficits, maximize independence, and reduce caregiver burden. Patient education complete. SLP will sign off.   Care Partner:  Caregiver Able to Provide Assistance: Yes  Type of Caregiver Assistance: Cognitive  Recommendation:  24 hour supervision/assistance;Outpatient SLP;Home Health SLP  Rationale for SLP Follow Up: Maximize cognitive function and independence   Equipment: n/a   Reasons for discharge: Discharged from hospital   Patient/Family Agrees with Progress Made and Goals Achieved: Yes   Ashley Ellin, M.A., CCC-SLP   Ashley A Ellin 06/19/2024, 2:33 PM

## 2024-06-19 NOTE — Progress Notes (Signed)
 PROGRESS NOTE   Subjective/Complaints:  Shoulder soreness left side when rolling in bed   ROS-   Pt denies SOB, abd pain, CP, N/V/C/D, and vision changes   Objective:   No results found. Recent Labs    06/19/24 0533  WBC 9.3  HGB 12.1*  HCT 35.5*  PLT 307    Recent Labs    06/19/24 0533  NA 135  K 4.0  CL 101  CO2 25  GLUCOSE 106*  BUN 14  CREATININE 0.80  CALCIUM  8.6*     Intake/Output Summary (Last 24 hours) at 06/19/2024 0829 Last data filed at 06/19/2024 0600 Gross per 24 hour  Intake 900 ml  Output 850 ml  Net 50 ml        Physical Exam: Vital Signs Blood pressure 114/64, pulse 69, temperature 97.8 F (36.6 C), temperature source Oral, resp. rate 16, height 5' 7 (1.702 m), weight 63.7 kg, SpO2 97%.   General: No acute distress Mood and affect are appropriate Heart: Regular rate and rhythm no rubs murmurs or extra sounds Lungs: Clear to auscultation, breathing unlabored, no rales or wheezes Abdomen: Positive bowel sounds, soft nontender to palpation, nondistended Extremities: No clubbing, cyanosis, or edema Skin: No evidence of breakdown, no evidence of rash    MSK- LUE in sling NWB- no change Skin: No evidence of breakdown, no evidence of rash Neurologic: Cranial nerves II through XII intact, motor strength is 5/5 in RIght deltoid, bicep, tricep, grip,5/5 B hip flexor, knee extensors, ankle dorsiflexor and plantar flexor LUE 5/5 grip , proximal not tested due to clavicular fracture  Sensory exam normal sensation to light touch  in bilateral upper  Musculoskeletal:LUE in sling , good wrist and finger ROM, No swelling in hand wrist, no tenderness in shoulder  No joint swelling  Drifts to left with gait  Assessment/Plan: 1. Functional deficits which require 3+ hours per day of interdisciplinary therapy in a comprehensive inpatient rehab setting. Physiatrist is providing close team  supervision and 24 hour management of active medical problems listed below. Physiatrist and rehab team continue to assess barriers to discharge/monitor patient progress toward functional and medical goals  Care Tool:  Bathing    Body parts bathed by patient: Chest, Left arm, Abdomen, Front perineal area, Buttocks, Right upper leg, Left upper leg, Right lower leg, Face, Left lower leg, Right arm   Body parts bathed by helper: Right arm     Bathing assist Assist Level: Supervision/Verbal cueing     Upper Body Dressing/Undressing Upper body dressing   What is the patient wearing?: Pull over shirt    Upper body assist Assist Level: Minimal Assistance - Patient > 75%    Lower Body Dressing/Undressing Lower body dressing      What is the patient wearing?: Pants, Incontinence brief     Lower body assist Assist for lower body dressing: Supervision/Verbal cueing     Toileting Toileting    Toileting assist Assist for toileting: Supervision/Verbal cueing     Transfers Chair/bed transfer  Transfers assist     Chair/bed transfer assist level: Supervision/Verbal cueing     Locomotion Ambulation   Ambulation assist  Assist level: Contact Guard/Touching assist Assistive device: No Device Max distance: 250   Walk 10 feet activity   Assist     Assist level: Contact Guard/Touching assist Assistive device: No Device   Walk 50 feet activity   Assist    Assist level: Contact Guard/Touching assist Assistive device: No Device    Walk 150 feet activity   Assist    Assist level: Contact Guard/Touching assist Assistive device: No Device    Walk 10 feet on uneven surface  activity   Assist     Assist level: Contact Guard/Touching assist Assistive device: Other (comment) (no device outdoors.)   Wheelchair     Assist Is the patient using a wheelchair?: No             Wheelchair 50 feet with 2 turns activity    Assist             Wheelchair 150 feet activity     Assist          Blood pressure 114/64, pulse 69, temperature 97.8 F (36.6 C), temperature source Oral, resp. rate 16, height 5' 7 (1.702 m), weight 63.7 kg, SpO2 97%.  Medical Problem List and Plan: 1. 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  Antithrombotics: -DVT/anticoagulation:  Pharmaceutical: Eliquis             -antiplatelet therapy: N/A 3. Pain Management: Tylenol  prn   10/4- not c/o a lot of pain of LUE-  OK with tylenol    10/5- soreness in LUE/shoulder- con't tylenol  4. Mood/Behavior/Sleep: LCSW to follow for evaluation and support.              --continue Melatonin at bedtime.              -antipsychotic agents: N/A 5. Neuropsych/cognition: This patient is capable of making decisions on his own behalf. 6. Skin/Wound Care: Routine pressure relief measures.  7. Fluids/Electrolytes/Nutrition: Monitor I/O. Check CMET in am.     Latest Ref Rng & Units 06/19/2024    5:33 AM 06/16/2024    6:28 AM 06/12/2024    9:39 AM  BMP  Glucose 70 - 99 mg/dL 893  887  871   BUN 8 - 23 mg/dL 14  10  11    Creatinine 0.61 - 1.24 mg/dL 9.19  9.26  9.28   Sodium 135 - 145 mmol/L 135  136  134   Potassium 3.5 - 5.1 mmol/L 4.0  4.3  4.5   Chloride 98 - 111 mmol/L 101  101  100   CO2 22 - 32 mmol/L 25  25  26    Calcium  8.9 - 10.3 mg/dL 8.6  8.7  8.8    BMP essentially normal  8.  A fib rate controlled: Monitor HR TID. Now on Eliquis.  9. Thrombocytopenia: Platelets down to 102.  --Recheck in ma. 10. Abnormal LFTs: resolved but alb dropping     Latest Ref Rng & Units 06/11/2024    5:55 AM 06/06/2024    9:07 AM 06/05/2024    7:00 PM  Hepatic Function  Total Protein 6.5 - 8.1  g/dL 5.7  6.3  7.2   Albumin 3.5 - 5.0 g/dL 3.0  3.5  4.2    AST 15 - 41 U/L 17  34  53   ALT 0 - 44 U/L 14  22  23    Alk Phosphatase 38 - 126 U/L 44  40  45   Total Bilirubin 0.0 - 1.2 mg/dL 0.9  1.3  1.8   Ensure high-protein twice daily 11.  Mildly comminuted distal left clavicle Fx:  Sling prn for comfort. Tylenol  for pain  May move toward sling only for amb and transfers ok per Ortho PA, f/u with Dr Sharl as OP in 2-3wks 12. HTN: Monitor BP TID--off amlodipine  currently.           Controlled  Vitals:   06/18/24 1954 06/19/24 0525  BP: 113/75 114/64  Pulse: 73 69  Resp: 16 16  Temp: 98.4 F (36.9 C) 97.8 F (36.6 C)  SpO2: 100% 97%     Dyslipidemia: LDL- 127. Now on Lipitor 40 mg daily.  13.  Impaired fasting glucose: Hgb A1C- 5.4.    10/4- Last BG on BMP was 128-         LOS: 9 days A FACE TO FACE EVALUATION WAS PERFORMED  Kyle [name] 06/19/2024, 8:29 AM

## 2024-06-19 NOTE — Progress Notes (Signed)
 Occupational Therapy Weekly Progress Note  Patient Details  Name: [name] MRN: [id number] Date of Birth: [birth year]  Beginning of progress report period: June 11, 2024 End of progress report period: June 19, 2024  Today's Date: 06/19/2024 OT Individual Time: 0830-0920 OT Individual Time Calculation (min): 50 min    No STGs were set due to expected LOS. Pt initially had mod ind goals as he lives alone, but with his L visual field cut and inattention and with mobility restrictions of not being able to use his LUE due to L clavicle fracture, LTGs modified to supervision. His family will provide 24/7 supervision at home until pt is able to safely reach a mod ind level.   Patient continues to demonstrate the following deficits: decreased visual perceptual skills and field cut, decreased attention to left, decreased safety awareness, and decreased balance strategies and therefore will continue to benefit from skilled OT intervention to enhance overall performance with BADL and iADL.  Patient progressing toward long term goals..  Continue plan of care. LTGs supervision - focusing on increasing pt's safety awareness for pt to need less cuing.    OT Short Term Goals Week 1:  OT Short Term Goal 1 (Week 1): STGs = LTGs OT Short Term Goal 1 - Progress (Week 1): Progressing toward goal Week 2:  OT Short Term Goal 1 (Week 2): STGs = LTGs  Skilled Therapeutic Interventions/Progress Updates:    Pt received in bed and agreeable to a shower.   Pt understands the recommendations for 24/7 supervision for when he initially goes home until he no longer has L arm precautions.  Pt did well following cues to sit to EOB with S.  Pt completed all sit to stands, transfers, ambulation in room,  toileting, undressing and dressing, shower, standing at sink to complete oral care and shave with supervision.  Improved L visual scanning to find items on L side of sink today.  Only 1 cue to check to make sure water  had been turned off.   Provided pt with printed hand outs on L visual scanning and L visual field deficits.   Pt resting in recliner with belt alarm on and all needs met.   Therapy Documentation Precautions:  Precautions Precautions: Fall Restrictions Weight Bearing Restrictions Per Provider Order: Yes LUE Weight Bearing Per Provider Order: Non weight bearing Other Position/Activity Restrictions: sling for transfers and ambulation only   Pain:  No c/o pain  ADL: Supervision with all tasks due to limited safety awareness of not following LUE weight bearing precautions and L inattention   Therapy/Group: Individual Therapy  Rafeal Skibicki 06/19/2024, 10:11 AM

## 2024-06-19 NOTE — Progress Notes (Signed)
 Speech Language Pathology Daily Session Note  Patient Details  Name: Kyle [name] MRN: [id number] Date of Birth: 1935/07/16  Today's Date: 06/19/2024 SLP Individual Time: 1000-1100 SLP Individual Time Calculation (min): 60 min  Short Term Goals: Week 1: SLP Short Term Goal 1 (Week 1): STGs=LTGs d/t ELOS  Skilled Therapeutic Interventions:   SLP conducted skilled therapy session targeting cognition goals. SLP facilitated 2 mildly complex pattern recreation tasks to target organization, problem solving, and sustained attention. Patient supervisionA for sustained attention during both activities likely due to tasks requiring increased concentration and focus. Patient minA to [name] for problem solving and organization. Patient benefited from increased processing time to recreate patterns in 6/6 trials. Patient was left in room with call bell in reach and alarm set. SLP will continue to target goals per plan of care.     Pain Pain Assessment Pain Scale: 0-10 Pain Score: 0-No pain  Therapy/Group: Individual Therapy  Kyle [name] 06/19/2024, 11:12 AM

## 2024-06-19 NOTE — Plan of Care (Signed)
 Downgraded to supervision due to L inattention, cognitive deficits, and slower than anticipated progress  Problem: RH Balance Goal: LTG Patient will maintain dynamic standing balance (PT) Description: LTG:  Patient will maintain dynamic standing balance with assistance during mobility activities (PT) Flowsheets (Taken 06/19/2024 1255) LTG: Pt will maintain dynamic standing balance during mobility activities with:: Supervision/Verbal cueing   Problem: Sit to Stand Goal: LTG:  Patient will perform sit to stand with assistance level (PT) Description: LTG:  Patient will perform sit to stand with assistance level (PT) Flowsheets (Taken 06/19/2024 1255) LTG: PT will perform sit to stand in preparation for functional mobility with assistance level: Supervision/Verbal cueing   Problem: RH Bed Mobility Goal: LTG Patient will perform bed mobility with assist (PT) Description: LTG: Patient will perform bed mobility with assistance, with/without cues (PT). Flowsheets (Taken 06/19/2024 1255) LTG: Pt will perform bed mobility with assistance level of: Supervision/Verbal cueing   Problem: RH Bed to Chair Transfers Goal: LTG Patient will perform bed/chair transfers w/assist (PT) Description: LTG: Patient will perform bed to chair transfers with assistance (PT). Flowsheets (Taken 06/19/2024 1255) LTG: Pt will perform Bed to Chair Transfers with assistance level: Supervision/Verbal cueing   Problem: RH Car Transfers Goal: LTG Patient will perform car transfers with assist (PT) Description: LTG: Patient will perform car transfers with assistance (PT). Flowsheets (Taken 06/19/2024 1255) LTG: Pt will perform car transfers with assist:: Contact Guard/Touching assist   Problem: RH Ambulation Goal: LTG Patient will ambulate in controlled environment (PT) Description: LTG: Patient will ambulate in a controlled environment, # of feet with assistance (PT). Flowsheets (Taken 06/19/2024 1255) LTG: Pt will ambulate  in controlled environ  assist needed:: Supervision/Verbal cueing LTG: Ambulation distance in controlled environment: 150' Goal: LTG Patient will ambulate in home environment (PT) Description: LTG: Patient will ambulate in home environment, # of feet with assistance (PT). Flowsheets (Taken 06/19/2024 1255) LTG: Pt will ambulate in home environ  assist needed:: Supervision/Verbal cueing LTG: Ambulation distance in home environment: 59'

## 2024-06-20 ENCOUNTER — Other Ambulatory Visit (HOSPITAL_COMMUNITY): Payer: Self-pay

## 2024-06-20 MED ORDER — FAMOTIDINE 20 MG PO TABS
20.0000 mg | ORAL_TABLET | Freq: Every day | ORAL | 0 refills | Status: DC
  Filled 2024-06-20: qty 30, 30d supply, fill #0

## 2024-06-20 MED ORDER — FAMOTIDINE 20 MG PO TABS: 20.0000 mg | ORAL_TABLET | Freq: Every day | ORAL | 0 refills | Status: DC

## 2024-06-20 MED ORDER — ATORVASTATIN CALCIUM 40 MG PO TABS
40.0000 mg | ORAL_TABLET | Freq: Every day | ORAL | 0 refills | Status: DC
Start: 2024-06-20 — End: 2024-07-24
  Filled 2024-06-20: qty 30, 30d supply, fill #0

## 2024-06-20 MED ORDER — TRAZODONE HCL 50 MG PO TABS
25.0000 mg | ORAL_TABLET | Freq: Every evening | ORAL | 0 refills | Status: DC | PRN
  Filled 2024-06-20: qty 15, 15d supply, fill #0

## 2024-06-20 MED ORDER — APIXABAN 5 MG PO TABS
5.0000 mg | ORAL_TABLET | Freq: Two times a day (BID) | ORAL | 0 refills | Status: DC
  Filled 2024-06-20: qty 60, 30d supply, fill #0

## 2024-06-20 MED ORDER — MELATONIN 5 MG PO TABS
5.0000 mg | ORAL_TABLET | Freq: Every day | ORAL | 0 refills | Status: DC
  Filled 2024-06-20: qty 30, 30d supply, fill #0

## 2024-06-20 NOTE — Progress Notes (Signed)
 Speech Language Pathology Daily Session Note  Patient Details  Name: Kyle [name] MRN: [id number] Date of Birth: 10-Nov-1934  Today's Date: 06/20/2024 SLP Individual Time: 0900-0956 SLP Individual Time Calculation (min): 56 min  Short Term Goals: Week 1: SLP Short Term Goal 1 (Week 1): STGs=LTGs d/t ELOS  Skilled Therapeutic Interventions:   Pt greeted in his room with RNT present. He was up at the sink finishing hand hygiene after toileting and required only supervision to ensure safety. Upon completion, SLP assisted pt back to his recliner where he participated in tx tasks targeting cognition. He was within 3 days of current date and reported the correct dow. SLP then facilitated money calculation task x2. He benefited from [facility] for visual organization, problem solving, working memory, and error awareness. Of note, additional processing time required throughout tx tasks. He did appear to present w/ notably improved sustained attention as compared to prev tx sessions as minimal redirections were required for internal distractions or tangential speech. SLP reviewed his upcoming therapy appointments and provided pt w/ visual aid to assist w/ recall and planning. At the end of tx tasks, he was left in his recliner w/ the alarm set and call light within reach. Telesitter in view as well. Recommend cont ST per POC.   Pain  No pain reported  Therapy/Group: Individual Therapy  Kyle [name] 06/20/2024, 9:22 AM

## 2024-06-20 NOTE — Progress Notes (Signed)
 Inpatient Rehabilitation Discharge Medication Review by a Pharmacist  A complete drug regimen review was completed for this patient to identify any potential clinically significant medication issues.  High Risk Drug Classes Is patient taking? Indication by Medication  Antipsychotic No   Anticoagulant Yes Apixaban - Afib, CVA  Antibiotic No   Opioid No   Antiplatelet No   Hypoglycemics/insulin No   Vasoactive Medication No   Chemotherapy No   Other Yes Trazodone prn sleep Fish oil - HLD Atorvastatin  - HLD Famotidine - Reflux      Type of Medication Issue Identified Description of Issue Recommendation(s)  Drug Interaction(s) (clinically significant)     Duplicate Therapy     Allergy     No Medication Administration End Date     Incorrect Dose     Additional Drug Therapy Needed     Significant med changes from prior encounter (inform family/care partners about these prior to discharge).    Other       Clinically significant medication issues were identified that warrant physician communication and completion of prescribed/recommended actions by midnight of the next day:  No  Name of provider notified for urgent issues identified:   Provider Method of Notification:     Pharmacist comments: None  Time spent performing this drug regimen review (minutes):  20 minutes  Olam Monte, PharmD

## 2024-06-20 NOTE — Progress Notes (Signed)
 Physical Therapy Session Note  Patient Details  Name: Kyle [name] MRN: [id number] Date of Birth: [birth year]  Today's Date: 06/20/2024 PT Individual Time: 1307-1349 PT Individual Time Calculation (min): 42 min   Short Term Goals: Week 2:  PT Short Term Goal 1 (Week 2): STG = LTG due to ELOS  Skilled Therapeutic Interventions/Progress Updates: Patient sitting in recliner with daughter on entrance to room. Patient alert and agreeable to PT session.   Patient reported no pain during session. Session focused on pt's grady. See d/c for summary. Pt daughter present with PTA reminding daughter about pt's CGA/close supervision status when ambulating due to decreased safety awareness and attention to L and mild imbalances that occur occasionally. Pt performed transfers with no AD or with SPC and with supervision and VC to recall NWB status of L UE (PTA showed pt daughter how to donn around L UE and torso to ensure it stays inside sling). Pt ambulated from room<>main gym with close supervision/CGA with SPC and increased cuing required for pt to navigate hallway according to PTA instruction (pt seemingly HOH in moderately distracting environment). Pt navigated 12 (6) steps with RHR ascending with with reciprocal pattern and CGA. Pt required closer to minA to safely descend steps due to increase in posterior lean. Pt performed step to pattern and cued to lead with L LE to avoid turning trunk to L when stepping down with R LE first. Pt with seated rest. Pt stood at table and cued to place numbered discs in sequential descending order from 12-1 (L to R). Pt required set up of 12 in L upper corner of table, and then min/mod cuing to recall instructions and to find 11 and place next to 12 vs on top. Pt progressed to min hinted cuing following and was able to find and place discs accordingly in less than 5 min which was an improvement since doing this with this PTA a week a go, which took over 10 min and pt frequently  trying to place them in ascending order vs descending.   Patient sitting in recliner at end of session with brakes locked, daughter present, belt alarm set, and all needs within reach.      Therapy Documentation Precautions:  Precautions Precautions: Fall Restrictions Weight Bearing Restrictions Per Provider Order: Yes LUE Weight Bearing Per Provider Order: Non weight bearing Other Position/Activity Restrictions: sling for transfers and ambulation only  Therapy/Group: Individual Therapy  Jerame Hedding PTA 06/20/2024, 3:20 PM

## 2024-06-20 NOTE — Progress Notes (Signed)
 Physical Therapy Discharge Summary  Patient Details  Name: Kyle [name] MRN: [id number] Date of Birth: 23-Feb-1935  Date of Discharge from PT service:June 20, 2024  Patient has met 6 of 8 long term goals due to improved activity tolerance, improved balance, improved postural control, increased strength, decreased pain, ability to compensate for deficits, improved attention, and improved awareness.  Patient to discharge at an ambulatory level CGA/Supervision.   Patient's care partner is independent to provide the necessary physical and cognitive assistance at discharge.  Reasons goals not met: Pt can ambulate with close supervision, but still requires CGA on occasion due to L inattention and decreased safety awareness.  Recommendation:  Patient will benefit from ongoing skilled PT services in home health setting to continue to advance safe functional mobility, address ongoing impairments in L sided awareness, dynamic standing balance, home safety, and minimize fall risk.  Equipment: SPC  Reasons for discharge: treatment goals met and discharge from hospital  Patient/family agrees with progress made and goals achieved: Yes  PT Discharge Precautions/Restrictions Precautions Precautions: Fall Restrictions Weight Bearing Restrictions Per Provider Order: Yes LUE Weight Bearing Per Provider Order: Non weight bearing Other Position/Activity Restrictions: sling for transfers and ambulation only Pain Pain Assessment Pain Scale: 0-10 Pain Score: 0-No pain Pain Interference Pain Interference Pain Effect on Sleep: 1. Rarely or not at all Pain Interference with Therapy Activities: 1. Rarely or not at all Pain Interference with Day-to-Day Activities: 1. Rarely or not at all Vision/Perception  Vision - History Ability to See in Adequate Light: 0 Adequate  Cognition Overall Cognitive Status: Impaired/Different from baseline Arousal/Alertness: Awake/alert Orientation Level: Oriented  X4 Attention: Sustained Sustained Attention: Impaired Memory: Impaired Memory Impairment: Decreased recall of new information Awareness: Impaired Awareness Impairment: Anticipatory impairment;Emergent impairment;Intellectual impairment Safety/Judgment: Impaired Sensation Sensation Light Touch: Appears Intact Hot/Cold: Appears Intact Proprioception: Impaired by gross assessment Stereognosis: Appears Intact;Not tested Coordination Gross Motor Movements are Fluid and Coordinated: No Coordination and Movement Description: Will occasionally slightly scissor step L LE that leads to increase in risk of fall Motor  Motor Motor: Other (comment) Motor - Discharge Observations: Generalized deconditoning and pain from clavicle fx that has improved since evaluation  Mobility Bed Mobility Bed Mobility: Supine to Sit;Sit to Supine Supine to Sit: Supervision/Verbal cueing Sit to Supine: Supervision/Verbal cueing Transfers Transfers: Sit to Stand;Stand to Sit;Stand Pivot Transfers Sit to Stand: Supervision/Verbal cueing Stand to Sit: Supervision/Verbal cueing Stand Pivot Transfers: Supervision/Verbal cueing Stand Pivot Transfer Details: Verbal cues for precautions/safety Transfer (Assistive device): Straight cane Locomotion  Gait Ambulation: Yes Gait Assistance: Contact Guard/Touching assist;Supervision/Verbal cueing Gait Distance (Feet): 250 Feet Assistive device: Straight cane Gait Assistance Details: Verbal cues for precautions/safety Gait Gait: Yes Gait Pattern: Impaired Gait Pattern: Left flexed knee in stance;Right flexed knee in stance Stairs / Additional Locomotion Stairs: Yes Stairs Assistance: Minimal Assistance - Patient > 75% Stair Management Technique: Alternating pattern;One rail Right;Forwards Number of Stairs: 12 Height of Stairs: 6 Pick up small object from the floor assist level: Contact Guard/Touching assist Wheelchair Mobility Wheelchair Mobility: No   Trunk/Postural Assessment  Cervical Assessment Cervical Assessment: Within Functional Limits Thoracic Assessment Thoracic Assessment: Within Functional Limits Lumbar Assessment Lumbar Assessment: Within Functional Limits Postural Control Postural Control: Deficits on evaluation Trunk Control: Posterior bias - mild - able to self correct  Balance Balance Balance Assessed: Yes Static Sitting Balance Static Sitting - Balance Support: Feet supported;No upper extremity supported Static Sitting - Level of Assistance: 7: Independent Dynamic Sitting Balance Dynamic Sitting - Balance Support:  Feet supported Dynamic Sitting - Level of Assistance: 6: Modified independent (Device/Increase time) Static Standing Balance Static Standing - Balance Support: Right upper extremity supported Static Standing - Level of Assistance: 6: Modified independent (Device/Increase time) Dynamic Standing Balance Dynamic Standing - Balance Support: Right upper extremity supported Dynamic Standing - Level of Assistance: 5: Stand by assistance Extremity Assessment  RLE Assessment RLE Assessment: Within Functional Limits LLE Assessment LLE Assessment: Within Functional Limits   Dominic Sandoval PTA   06/20/2024, 12:55 PM

## 2024-06-20 NOTE — Plan of Care (Signed)
?  Problem: RH Problem Solving ?Goal: LTG Patient will demonstrate problem solving for (SLP) ?Description: LTG:  Patient will demonstrate problem solving for basic/complex daily situations with cues  (SLP) ?Outcome: Completed/Met ?  ?Problem: RH Memory ?Goal: LTG Patient will use memory compensatory aids to (SLP) ?Description: LTG:  Patient will use memory compensatory aids to recall biographical/new, daily complex information with cues (SLP) ?Outcome: Completed/Met ?  ?Problem: RH Attention ?Goal: LTG Patient will demonstrate this level of attention during functional activites (SLP) ?Description: LTG:  Patient will will demonstrate this level of attention during functional activites (SLP) ?Outcome: Completed/Met ?  ?Problem: RH Awareness ?Goal: LTG: Patient will demonstrate awareness during functional activites type of (SLP) ?Description: LTG: Patient will demonstrate awareness during functional activites type of (SLP) ?Outcome: Completed/Met ?  ?

## 2024-06-20 NOTE — Progress Notes (Addendum)
 Patient ID: Kyle [name], male   DOB: 1935-05-26, 89 y.o.   MRN: [id number]  Met with pt to ask if he has a cane at home he does not and is in agreement with worker ordering him one to take home with. Have placed order to [employer] for cane to be delivered to room prior to discharge tomorrow.  1:38 PM Met with [name]-daughter who is here and present for his PT session. She can see he is doing better and stronger, pt is looking forward to going home tomorrow. [place] should be delivered to room today or early tomorrow

## 2024-06-20 NOTE — Progress Notes (Signed)
 Occupational Therapy Session Note  Patient Details  Name: Kyle [name] MRN: [id number] Date of Birth: 07-15-1935  Today's Date: 06/20/2024 OT Individual Time: 9263-9178 OT Individual Time Calculation (min): 45 min    Short Term Goals: Week 2:  OT Short Term Goal 1 (Week 2): STGs = LTGs  Skilled Therapeutic Interventions/Progress Updates:   Pt greeted supine in bed finishing breakfast, pt agreeable to OT intervention.      Transfers/bed mobility/functional mobility:  Pt required MIN A to elevate trunk into sitting. Pt completed all sit>stands and functional mobility with no AD and supervision.  Practiced bed mobility in ADL apt with use of bed rail, pt able to transition to supine>sit with supervision with use of rail. Briefly discussed potentially getting one for home to aid in independence with bed mobility, pt receptive to education.    ADLs:  Grooming: pt completed standing oral care at sink with supervision for safety d/t L inattenion LB dressing: pt donned pants/underwear with supervision from EOB Footwear: pt able to don and tie shoes with supervision with increased time Transfers: ambulatory toilet transfer with no AD and supervision.  Self feeding: pt needed min verbal cues to locate coffee on L side of tray.     Ended session with pt on toilet with NT present.   Therapy Documentation Precautions:  Precautions Precautions: Fall Restrictions Weight Bearing Restrictions Per Provider Order: Yes LUE Weight Bearing Per Provider Order: Non weight bearing Other Position/Activity Restrictions: sling for transfers and ambulation only  Pain: No pain reported during session    Therapy/Group: Individual Therapy  [name] 06/20/2024, 12:04 PM

## 2024-06-20 NOTE — Progress Notes (Signed)
 Speech Language Pathology Daily Session Note  Patient Details  Name: Kyle [name] MRN: [id number] Date of Birth: [birth year]  Today's Date: 06/20/2024 SLP Individual Time: 8595-8561 SLP Individual Time Calculation (min): 34 min  Short Term Goals: Week 1: SLP Short Term Goal 1 (Week 1): STGs=LTGs d/t ELOS  Skilled Therapeutic Interventions: SLP conducted skilled therapy session targeting cognition goals. SLP facilitated basic problem solving/scanning tasks. Across both tasks, patient solved BASIC problems given minA for scanning, thoroughness, processing speed, and sustained attention. In remaining minutes of session, facilitated basic money math where patient benefited from min to mod cues for working memory and supervision-min assist for mental problem solving. Patient is appropriate for discharge. See discharge summary for full details.     Pain  None endorsed  Therapy/Group: Individual Therapy  Damara Klunder, M.A., CCC-SLP  Kein Carlberg A Hildagarde Holleran 06/20/2024, 2:41 PM

## 2024-06-20 NOTE — Plan of Care (Signed)
  Problem: RH Balance Goal: LTG Patient will maintain dynamic standing with ADLs (OT) Description: LTG:  Patient will maintain dynamic standing balance with assist during activities of daily living (OT)  Outcome: Completed/Met   Problem: Sit to Stand Goal: LTG:  Patient will perform sit to stand in prep for activites of daily living with assistance level (OT) Description: LTG:  Patient will perform sit to stand in prep for activites of daily living with assistance level (OT) Outcome: Completed/Met   Problem: RH Bathing Goal: LTG Patient will bathe all body parts with assist levels (OT) Description: LTG: Patient will bathe all body parts with assist levels (OT) Outcome: Completed/Met   Problem: RH Dressing Goal: LTG Patient will perform upper body dressing (OT) Description: LTG Patient will perform upper body dressing with assist, with/without cues (OT). Outcome: Completed/Met Goal: LTG Patient will perform lower body dressing w/assist (OT) Description: LTG: Patient will perform lower body dressing with assist, with/without cues in positioning using equipment (OT) Outcome: Completed/Met   Problem: RH Toileting Goal: LTG Patient will perform toileting task (3/3 steps) with assistance level (OT) Description: LTG: Patient will perform toileting task (3/3 steps) with assistance level (OT)  Outcome: Completed/Met   Problem: RH Simple Meal Prep Goal: LTG Patient will perform simple meal prep w/assist (OT) Description: LTG: Patient will perform simple meal prep with assistance, with/without cues (OT). Outcome: Completed/Met   Problem: RH Toilet Transfers Goal: LTG Patient will perform toilet transfers w/assist (OT) Description: LTG: Patient will perform toilet transfers with assist, with/without cues using equipment (OT) Outcome: Completed/Met   Problem: RH Tub/Shower Transfers Goal: LTG Patient will perform tub/shower transfers w/assist (OT) Description: LTG: Patient will perform  tub/shower transfers with assist, with/without cues using equipment (OT) Outcome: Completed/Met   Problem: RH Attention Goal: LTG Patient will demonstrate this level of attention during functional activites (OT) Description: LTG:  Patient will demonstrate this level of attention during functional activites  (OT) Outcome: Completed/Met   Problem: RH Awareness Goal: LTG: Patient will demonstrate awareness during functional activites type of (OT) Description: LTG: Patient will demonstrate awareness during functional activites type of (OT) Outcome: Completed/Met

## 2024-06-20 NOTE — Discharge Summary (Signed)
 Physician Discharge Summary  Patient ID: Kyle [name] MRN: [id number] DOB/AGE: 87/04/1935 87 y.o.  Admit date: 06/10/2024 Discharge date: 06/20/2024  Discharge Diagnoses:  Principal Problem:   Stroke of right basal ganglia [facility]) Active Problems:   Atrial fibrillation (HCC)   Hypertension   Clavicle fracture  Impaired fasting glucose  Anemia  Abnormal LFTs.   Hyponatremia.      Discharged Condition: stable  Significant Diagnostic Studies: N/A   Labs:  Basic Metabolic Panel:    Latest Ref Rng & Units 06/19/2024    5:33 AM 06/16/2024    6:28 AM 06/12/2024    9:39 AM  BMP  Glucose 70 - 99 mg/dL 893  887  871   BUN 8 - 23 mg/dL 14  10  11    Creatinine 0.61 - 1.24 mg/dL 9.19  9.26  9.28   Sodium 135 - 145 mmol/L 135  136  134   Potassium 3.5 - 5.1 mmol/L 4.0  4.3  4.5   Chloride 98 - 111 mmol/L 101  101  100   CO2 22 - 32 mmol/L 25  25  26    Calcium  8.9 - 10.3 mg/dL 8.6  8.7  8.8      CBC:    Latest Ref Rng & Units 06/19/2024    5:33 AM 06/16/2024    6:28 AM 06/12/2024    5:32 AM  CBC  WBC 4.0 - 10.5 K/uL 9.3  10.9  6.5   Hemoglobin 13.0 - 17.0 g/dL 87.8  86.7  87.5   Hematocrit 39.0 - 52.0 % 35.5  38.7  35.7   Platelets 150 - 400 K/uL 307  317  195      CBG: No results for input(s): GLUCAP in the last 168 hours.  Brief HPI:   Kyle [name] is a 89 y.o. male with history of BPH, non-obst CAD, post op A fib in the past who was admitted on 06/05/24 with MS changes. Left facial droop and left shoulder pain.  He was found to have left transverse nondisplaced distal clavicle fracture, acute infarct in right basal ganglia with associated petechial hemorrhage as well as small acute embolic infarcts right parietal cortex.  CTA head/neck negative for LVO.  He was noted to be in A-fib at admission and stroke felt to be embolic in setting of A-fib.    Sling was ordered with nonweightbearing LUE for management of clavicle fracture.  Once noted to be neurologically stable, ASA  was changed to Eliquis in 09/28 and Lipitor was increased to 40 mg due to LDL of 127.  He was noted to have mild confusion with difficulty following multistep commands, ataxic gait, mild left-sided weakness with right gaze preference and was requiring min assist with ADLs and mobility.  He was independent and driving prior to admission.  CIR was recommended due to functional decline.   Hospital Course: CAMARI WISHAM was admitted to rehab 06/10/2024 for inpatient therapies to consist of PT, ST and OT at least three hours five days a week. Past admission physiatrist, therapy team and rehab RN have worked together to provide customized collaborative inpatient rehab.  He was maintained on Eliquis during his stay with follow-up CBC showing H&H to be stable, reactive leucocytosis and thrombocytopenia has resolved.  Serial check BMET showed electrolytes and renal status to be within normal limits.  Impaired fasting glucose noted with hemoglobin A1c of 5.4.  Abnormal LFTs have resolved.  He is blood pressures were monitored on TID basis and  have been stable.  Pain left shoulder has been managed with use of Tylenol  as needed.  P.o. intake is improving and HR has been controlled with increase in activity.  Sleep-wake disruption is improving with use of melatonin as well as trazodone.  He has made steady gains and requires supervision at discharge. He will continue to receive follow up HHPT and HHOT by [facility] past discharge. Speech therapy not available in his area.     Rehab course: During patient's stay in rehab weekly team conferences were held to monitor patient's progress, set goals and discuss barriers to discharge. At admission, patient required min assist with ADL tasks and with mobility.  He exhibited severe deficits in short-term memory and long-term memory appeared intact.  He was noted to have poor awareness of deficits with decreased speech clarity. He  has had improvement in activity  tolerance, balance, postural control as well as ability to compensate for deficits.  He is able to complete ADL tasks with supervision.  He requires supervision with basic ADL tasks. He requires supervision for transfers and contact-guard assist ambulate 250 feet with straight cane.  He requires cues for precautions and safety.  He requires min assist to climb 12 stairs. He requires min assist for sustained attention, due to the recall as well as min to mod assist for functional problem-solving and mod assist for awareness of deficits.  Family education has been completed.   Discharge disposition: 06-Home-Health Care Svc  Diet: Heart healthy  Special Instructions: 1.No weigh on LUE. Use sling when out of bed 2. No driving.   Discharge Instructions     Ambulatory referral to Neurology   Complete by: As directed    An appointment is requested in approximately: 6 weekls   Ambulatory referral to Physical Medicine Rehab   Complete by: As directed       Allergies as of 06/21/2024       Reactions   Penicillins    REACTION: dizziness/syncope        Medication List     STOP taking these medications    amLODipine  5 MG tablet Commonly known as: NORVASC    aspirin  EC 81 MG tablet   GOODYS EXTRA STRENGTH PO   OSTEO BI-FLEX REGULAR STRENGTH PO       TAKE these medications    atorvastatin  40 MG tablet Commonly known as: LIPITOR Take 1 tablet (40 mg total) by mouth daily.   Eliquis 5 MG Tabs tablet Generic drug: apixaban Take 1 tablet (5 mg total) by mouth 2 (two) times daily.   famotidine 20 MG tablet Commonly known as: PEPCID Take 1 tablet (20 mg total) by mouth daily.   fish oil-omega-3 fatty acids  1000 MG capsule Take 1 capsule by mouth 2 (two) times daily.   melatonin 5 MG Tabs Take 1 tablet (5 mg total) by mouth at bedtime.   traZODone 50 MG tablet Commonly known as: DESYREL Take 0.5-1 tablets (25-50 mg total) by mouth at bedtime as needed for sleep. Notes  to patient: Has been taking a whole tab for sleeping while in hospital.        Follow-up Information     Dettinger, Fonda LABOR, MD. Call.   Specialties: Family Medicine, Cardiology Why: Monday for follow up appointment Contact information: [address] [place] [address] [phone]         Carilyn Prentice BRAVO, MD Follow up.   Specialty: Physical Medicine and Rehabilitation Why: office will call you with follow  up appointment Contact information: [address] [place] [address] [phone]         Sharl Selinda Dover, MD. Call.   Specialty: Orthopedic Surgery Why: for follow up on clavicle fracture. Contact information: [address] [place] [address] [phone]         [facility] Follow up.   Why: office will call you with follow up appointment Contact information: [address]     [address] [place]  [address] [phone]        Lavona Agent, MD. Call.   Specialty: Cardiology Why: As needed Contact information: [address] [place] [address] [phone]                 Signed: Sharlet GORMAN Schmitz 06/23/2024, 4:02 PM

## 2024-06-20 NOTE — Group Note (Signed)
 Patient Details Name: Kyle [name] MRN: [id number] DOB: 10/02/1934 Today's Date: 06/20/2024  Time Calculation:   PT Group Time Calculation PT Group Start Time: 1030 PT Group Stop Time: 1130 PT Group Time Calculation (min): 60 min    Group Description: Stress management: Pt participated in group session with a focus on stress mgmt, education provided on healthy coping strategies, and social interaction. Focus of session on providing coping strategies to manage new diagnosis to allow for improved mental health to increase overall quality of life . Discussed how to break down stressors into "daily hassles," "major life stressors" and "life circumstances" in an effort to allow pts to chunk their stressors into groups and determine where to best put their efforts/time when dealing with stress. Provided active listening, emotional support and therapeutic use of self. Offered education on factors that protect us  against stress such as "daily uplifts," "healthy coping strategies" and "protective factors." Encouraged all group members to make an effort to actively recall one event from their day that was a daily uplift in an effort to protect their mindset from stressors as well as sharing this information with their caregivers to facilitate improved caregiver communication and decrease overall burden of care.  Issued pt handouts on healthy coping strategies to implement into routine.  Individual level documentation: Patient participated with full collaboration during session.   Pain:    Precautions:    Lara Palinkas 06/20/2024, 12:32 PM

## 2024-06-20 NOTE — Progress Notes (Signed)
 Occupational Therapy Discharge Summary  Patient Details  Name: Kyle [name] MRN: [id number] Date of Birth: February 11, 1935  Date of Discharge from OT service:June 20, 2024  Today's Date: 06/20/2024       Patient has met 11 of 11 long term goals due to improved activity tolerance, improved balance, ability to compensate for deficits, improved attention, and improved awareness.  Patient to discharge at overall Supervision level.  Patient's care partner is independent to provide the necessary physical and cognitive assistance at discharge.  Family education has been completed.  Reasons goals not met: n/a  Recommendation:  Patient will benefit from ongoing skilled OT services in home health setting to continue to advance functional skills in the area of BADL and iADL with a focus on L side awareness with mobility in the home and balance reaction to prevent falls.   Equipment: No DME needs  Reasons for discharge: treatment goals met  Patient/family agrees with progress made and goals achieved: Yes  OT Discharge  ADL ADL Eating: Set up Where Assessed-Eating: Bed level Grooming: Supervision/safety Where Assessed-Grooming: Standing at sink Upper Body Bathing: Supervision/safety Where Assessed-Upper Body Bathing: Shower (sitting) Lower Body Bathing: Supervision/safety Where Assessed-Lower Body Bathing: Shower (sitting in shower) Upper Body Dressing: Supervision/safety Where Assessed-Upper Body Dressing: Edge of bed Lower Body Dressing: Supervision/safety Where Assessed-Lower Body Dressing: Edge of bed Toileting: Supervision/safety Where Assessed-Toileting: Teacher, adult education: Close supervision Toilet Transfer Method: Event organiser: Close supervision Film/video editor Method: Designer, industrial/product: Information systems manager with back Vision Baseline Vision/History: 1 Wears glasses Patient Visual Report: No change from baseline Vision Assessment?:  No apparent visual deficits Eye Alignment: Within Functional Limits Additional Comments: L inattention Perception  Perception: Within Functional Limits Praxis Praxis: WFL Cognition Brief Interview for Mental Status (BIMS) Repetition of Three Words (First Attempt): 3 Temporal Orientation: Year: Correct Temporal Orientation: Month: Accurate within 5 days Temporal Orientation: Day: Correct Recall: Sock: Yes, no cue required Recall: Blue: Yes, no cue required Recall: Bed: Yes, no cue required BIMS Summary Score: 15 Sensation Sensation Light Touch: Appears Intact Hot/Cold: Appears Intact Proprioception: Impaired by gross assessment Stereognosis: Appears Intact;Not tested Coordination Gross Motor Movements are Fluid and Coordinated: No Fine Motor Movements are Fluid and Coordinated: No Motor  Motor Motor: Other (comment) Mobility    Close supervision with cane Trunk/Postural Assessment  Cervical Assessment Cervical Assessment: Exceptions to [facility] (forward head) Thoracic Assessment Thoracic Assessment: Exceptions to [facility] (B shlder elevation, rounded) Lumbar Assessment Lumbar Assessment: Exceptions to [facility] (tilted posteriorly) Postural Control Postural Control: Deficits on evaluation Trunk Control: Posterior bias - mild - able to self correct  Balance Static Sitting Balance Static Sitting - Balance Support: Feet supported;No upper extremity supported Static Sitting - Level of Assistance: 7: Independent Dynamic Sitting Balance Dynamic Sitting - Balance Support: Feet supported Dynamic Sitting - Level of Assistance: 6: Modified independent (Device/Increase time) Static Standing Balance Static Standing - Balance Support: Right upper extremity supported Static Standing - Level of Assistance: 6: Modified independent (Device/Increase time) Dynamic Standing Balance Dynamic Standing - Balance Support: Right upper extremity supported Dynamic Standing - Level of Assistance: 5:  Stand by assistance (supervision) Extremity/Trunk Assessment RUE Assessment RUE Assessment: Within Functional Limits LUE Assessment Active Range of Motion (AROM) Comments: no shoulder AROM due to clavicle fx, NWB, in a sling General Strength Comments: able to use elbow and hand functionally   Kyle [name] 06/20/2024, 7:50 AM

## 2024-06-20 NOTE — Progress Notes (Signed)
 PROGRESS NOTE   Subjective/Complaints:  Working on making change with SLP, no co  ROS-   Pt denies SOB, abd pain, CP, N/V/C/D, and vision changes   Objective:   No results found. Recent Labs    06/19/24 0533  WBC 9.3  HGB 12.1*  HCT 35.5*  PLT 307    Recent Labs    06/19/24 0533  NA 135  K 4.0  CL 101  CO2 25  GLUCOSE 106*  BUN 14  CREATININE 0.80  CALCIUM  8.6*     Intake/Output Summary (Last 24 hours) at 06/20/2024 1116 Last data filed at 06/20/2024 0823 Gross per 24 hour  Intake 720 ml  Output 1200 ml  Net -480 ml        Physical Exam: Vital Signs Blood pressure 135/70, pulse 75, temperature 98.8 F (37.1 C), temperature source Oral, resp. rate 17, height 5' 7 (1.702 m), weight 63.7 kg, SpO2 100%.   General: No acute distress Mood and affect are appropriate Heart: Regular rate and rhythm no rubs murmurs or extra sounds Lungs: Clear to auscultation, breathing unlabored, no rales or wheezes Abdomen: Positive bowel sounds, soft nontender to palpation, nondistended Extremities: No clubbing, cyanosis, or edema Skin: No evidence of breakdown, no evidence of rash    MSK- LUE in sling NWB- no change, no pain to palpation  Skin: No evidence of breakdown, no evidence of rash Neurologic: Cranial nerves II through XII intact, motor strength is 5/5 in RIght deltoid, bicep, tricep, grip,5/5 B hip flexor, knee extensors, ankle dorsiflexor and plantar flexor LUE 5/5 grip , proximal not tested due to clavicular fracture  Sensory exam normal sensation to light touch  in bilateral upper  Musculoskeletal:LUE in sling , good wrist and finger ROM, No swelling in hand wrist, no tenderness in shoulder  No joint swelling  Drifts to left with gait  Assessment/Plan: 1. Functional deficits which require 3+ hours per day of interdisciplinary therapy in a comprehensive inpatient rehab setting. Physiatrist is  providing close team supervision and 24 hour management of active medical problems listed below. Physiatrist and rehab team continue to assess barriers to discharge/monitor patient progress toward functional and medical goals  Care Tool:  Bathing    Body parts bathed by patient: Chest, Left arm, Abdomen, Front perineal area, Buttocks, Right upper leg, Left upper leg, Right lower leg, Face, Left lower leg, Right arm   Body parts bathed by helper: Right arm     Bathing assist Assist Level: Supervision/Verbal cueing     Upper Body Dressing/Undressing Upper body dressing   What is the patient wearing?: Pull over shirt    Upper body assist Assist Level: Contact Guard/Touching assist    Lower Body Dressing/Undressing Lower body dressing      What is the patient wearing?: Pants, Incontinence brief     Lower body assist Assist for lower body dressing: Supervision/Verbal cueing     Toileting Toileting    Toileting assist Assist for toileting: Supervision/Verbal cueing     Transfers Chair/bed transfer  Transfers assist     Chair/bed transfer assist level: Supervision/Verbal cueing     Locomotion Ambulation   Ambulation assist  Assist level: Contact Guard/Touching assist Assistive device: No Device Max distance: 250   Walk 10 feet activity   Assist     Assist level: Contact Guard/Touching assist Assistive device: No Device   Walk 50 feet activity   Assist    Assist level: Contact Guard/Touching assist Assistive device: No Device    Walk 150 feet activity   Assist    Assist level: Contact Guard/Touching assist Assistive device: No Device    Walk 10 feet on uneven surface  activity   Assist     Assist level: Contact Guard/Touching assist Assistive device: Other (comment) (no device outdoors.)   Wheelchair     Assist Is the patient using a wheelchair?: No             Wheelchair 50 feet with 2 turns  activity    Assist            Wheelchair 150 feet activity     Assist          Blood pressure 135/70, pulse 75, temperature 98.8 F (37.1 C), temperature source Oral, resp. rate 17, height 5' 7 (1.702 m), weight 63.7 kg, SpO2 100%.  Medical Problem List and Plan: 1. 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  Antithrombotics: -DVT/anticoagulation:  Pharmaceutical: Eliquis             -antiplatelet therapy: N/A 3. Pain Management: Tylenol  prn   10/4- not c/o a lot of pain of LUE-  OK with tylenol    10/5- soreness in LUE/shoulder- con't tylenol  4. Mood/Behavior/Sleep: LCSW to follow for evaluation and support.              --continue Melatonin at bedtime.              -antipsychotic agents: N/A 5. Neuropsych/cognition: This patient is capable of making decisions on his own behalf. 6. Skin/Wound Care: Routine pressure relief measures.  7. Fluids/Electrolytes/Nutrition: Monitor I/O. Check CMET in am.     Latest Ref Rng & Units 06/19/2024    5:33 AM 06/16/2024    6:28 AM 06/12/2024    9:39 AM  BMP  Glucose 70 - 99 mg/dL 893  887  871   BUN 8 - 23 mg/dL 14  10  11    Creatinine 0.61 - 1.24 mg/dL 9.19  9.26  9.28   Sodium 135 - 145 mmol/L 135  136  134   Potassium 3.5 - 5.1 mmol/L 4.0  4.3  4.5   Chloride 98 - 111 mmol/L 101  101  100   CO2 22 - 32 mmol/L 25  25  26    Calcium  8.9 - 10.3 mg/dL 8.6  8.7  8.8    BMP essentially normal  8.  A fib rate controlled: Monitor HR TID. Now on Eliquis.  9. Thrombocytopenia: Platelets down to 102.  --Recheck in ma. 10. Abnormal LFTs: resolved but alb dropping     Latest Ref Rng & Units 06/11/2024    5:55 AM 06/06/2024    9:07 AM 06/05/2024    7:00 PM  Hepatic Function  Total Protein 6.5 - 8.1  g/dL 5.7  6.3  7.2    Albumin 3.5 - 5.0 g/dL 3.0  3.5  4.2   AST 15 - 41 U/L 17  34  53   ALT 0 - 44 U/L 14  22  23    Alk Phosphatase 38 - 126 U/L 44  40  45   Total Bilirubin 0.0 - 1.2 mg/dL 0.9  1.3  1.8   Ensure high-protein twice daily 11.  Mildly comminuted distal left clavicle Fx:  Sling prn for comfort. Tylenol  for pain  May move toward sling only for amb and transfers ok per Ortho PA, f/u with Dr Sharl as OP in 2-3wks 12. HTN: Monitor BP TID--off amlodipine  currently.           Controlled  Vitals:   06/19/24 1953 06/20/24 0435  BP: 139/63 135/70  Pulse: 73 75  Resp: 18 17  Temp: 98.7 F (37.1 C) 98.8 F (37.1 C)  SpO2: 100% 100%     Dyslipidemia: LDL- 127. Now on Lipitor 40 mg daily.  13.  Impaired fasting glucose: Hgb A1C- 5.4.    10/4- Last BG on BMP was 128-         LOS: 10 days A FACE TO FACE EVALUATION WAS PERFORMED  Kyle [name] 06/20/2024, 11:16 AM

## 2024-06-20 NOTE — Progress Notes (Signed)
 Inpatient Rehabilitation Care Coordinator Discharge Note DC SAT 10/11  Patient Details  Name: Kyle [name] MRN: [id number] Date of Birth: [birth year]   Discharge location: HOME WITH SON AND HIS FAMILY HE WILL HAVE 24/7 SUPERVISION AT HOME  Length of Stay: 11 DAYS  Discharge activity level: SUPERVISON LEVEL FOR SAFETY  Home/community participation: ACTIVE  Patient response un:Yzjouy Literacy - How often do you need to have someone help you when you read instructions, pamphlets, or other written material from your doctor or pharmacy?: Never  Patient response un:Dnrpjo Isolation - How often do you feel lonely or isolated from those around you?: Never  Services provided included: MD, RD, PT, OT, SLP, RN, CM, Pharmacy, SW  Financial Services:  Financial Services Utilized: Private Insurance [facility] MEDICARE  Choices offered to/list presented to: PT AND DAUGHTER  Follow-up services arranged:  Home Health, DME, Patient/Family has no preference for HH/DME agencies Home Health Agency: [facility]  PT  & OT  THERE IS NO SPEECH AVAILABLE IN HIS AREA    DME : [facility]    Patient response to transportation need: Is the patient able to respond to transportation needs?: Yes In the past 12 months, has lack of transportation kept you from medical appointments or from getting medications?: No In the past 12 months, has lack of transportation kept you from meetings, work, or from getting things needed for daily living?: No   Patient/Family verbalized understanding of follow-up arrangements:  Yes  Individual responsible for coordination of the follow-up plan: [name] [phone]  Confirmed correct DME delivered: [name] MATSU 06/20/2024    Comments (or additional information): PT DID WELL AND PROGRESSED. SON AND DAUGHTER ARE AWARE OF HIS NEED FOR 24/7 SUPERVISION FOR SAFETY AND NEEDED CUES AT TIMES.   Summary of Stay    Date/Time Discharge Planning CSW   06/18/24 0848 Son and daughter coming up with plan-aware pt will need 24 hr supervision for safety and some assist. Aware DC planned for Sat 10/11. Working n [employer] RGD  06/11/24 0857 New evaluation pt was living alone prior to admission will need to confirm with family if able to provide assist at discharge RGD       [name] G

## 2024-06-21 DIAGNOSIS — I10 Essential (primary) hypertension: Secondary | ICD-10-CM

## 2024-06-21 NOTE — Progress Notes (Signed)
 PROGRESS NOTE   Subjective/Complaints:  Pt doing well, ready to go home. Slept well, denies pain, LBM yesterday and again after eval, urinating fine. No other complaints or concerns. Questions answered.   ROS-   Pt denies SOB, abd pain, CP, N/V/C/D, and vision changes   Objective:   No results found. Recent Labs    06/19/24 0533  WBC 9.3  HGB 12.1*  HCT 35.5*  PLT 307    Recent Labs    06/19/24 0533  NA 135  K 4.0  CL 101  CO2 25  GLUCOSE 106*  BUN 14  CREATININE 0.80  CALCIUM  8.6*     Intake/Output Summary (Last 24 hours) at 06/21/2024 1052 Last data filed at 06/21/2024 0843 Gross per 24 hour  Intake 696 ml  Output 600 ml  Net 96 ml        Physical Exam: Vital Signs Blood pressure 116/65, pulse 72, temperature 98.2 F (36.8 C), temperature source Oral, resp. rate 17, height 5' 7 (1.702 m), weight 63.7 kg, SpO2 94%.   General: No acute distress, resting in bed comfortably Mood and affect are appropriate Heart: Regular rate and rhythm no rubs murmurs or extra sounds Lungs: Clear to auscultation, breathing unlabored, no rales or wheezes Abdomen: Positive bowel sounds, soft nontender to palpation, nondistended Extremities: No clubbing, cyanosis, or edema Skin: No evidence of breakdown, no evidence of rash over exposed surfaces  PRIOR EXAMS: MSK- LUE in sling NWB- no change, no pain to palpation  Skin: No evidence of breakdown, no evidence of rash Neurologic: Cranial nerves II through XII intact, motor strength is 5/5 in RIght deltoid, bicep, tricep, grip,5/5 B hip flexor, knee extensors, ankle dorsiflexor and plantar flexor LUE 5/5 grip , proximal not tested due to clavicular fracture  Sensory exam normal sensation to light touch  in bilateral upper  Musculoskeletal:LUE in sling , good wrist and finger ROM, No swelling in hand wrist, no tenderness in shoulder  No joint swelling  Drifts to left  with gait    Assessment/Plan: 1. Functional deficits which require 3+ hours per day of interdisciplinary therapy in a comprehensive inpatient rehab setting. Physiatrist is providing close team supervision and 24 hour management of active medical problems listed below. Physiatrist and rehab team continue to assess barriers to discharge/monitor patient progress toward functional and medical goals  Care Tool:  Bathing    Body parts bathed by patient: Chest, Left arm, Abdomen, Front perineal area, Buttocks, Right upper leg, Left upper leg, Right lower leg, Face, Left lower leg, Right arm   Body parts bathed by helper: Right arm     Bathing assist Assist Level: Supervision/Verbal cueing     Upper Body Dressing/Undressing Upper body dressing   What is the patient wearing?: Pull over shirt    Upper body assist Assist Level: Contact Guard/Touching assist    Lower Body Dressing/Undressing Lower body dressing      What is the patient wearing?: Pants, Incontinence brief     Lower body assist Assist for lower body dressing: Supervision/Verbal cueing     Toileting Toileting    Toileting assist Assist for toileting: Supervision/Verbal cueing     Transfers Chair/bed  transfer  Transfers assist     Chair/bed transfer assist level: Supervision/Verbal cueing     Locomotion Ambulation   Ambulation assist      Assist level: Contact Guard/Touching assist Assistive device: Cane-straight Max distance: 250   Walk 10 feet activity   Assist     Assist level: Contact Guard/Touching assist Assistive device: Cane-straight   Walk 50 feet activity   Assist    Assist level: Contact Guard/Touching assist Assistive device: Cane-straight    Walk 150 feet activity   Assist    Assist level: Contact Guard/Touching assist Assistive device: Cane-straight    Walk 10 feet on uneven surface  activity   Assist     Assist level: Contact Guard/Touching  assist Assistive device: Cane-straight   Wheelchair     Assist Is the patient using a wheelchair?: No             Wheelchair 50 feet with 2 turns activity    Assist            Wheelchair 150 feet activity     Assist          Blood pressure 116/65, pulse 72, temperature 98.2 F (36.8 C), temperature source Oral, resp. rate 17, height 5' 7 (1.702 m), weight 63.7 kg, SpO2 94%.  Medical Problem List and Plan: 1. 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  2.  Antithrombotics: -DVT/anticoagulation:  Pharmaceutical: Eliquis             -antiplatelet therapy: N/A 3. Pain Management: Tylenol  prn   10/4- not c/o a lot of pain of LUE- OK with tylenol    10/5- soreness in LUE/shoulder- con't tylenol  4. Mood/Behavior/Sleep: LCSW to follow for evaluation and support.              --continue Melatonin at bedtime.              -antipsychotic agents: N/A 5. Neuropsych/cognition: This patient is capable of making decisions on his own behalf. 6. Skin/Wound Care: Routine pressure relief measures.  7. Fluids/Electrolytes/Nutrition: Monitor I/O. Check CMET in am.     Latest Ref Rng & Units 06/19/2024    5:33 AM 06/16/2024    6:28 AM 06/12/2024    9:39 AM  BMP  Glucose 70 - 99 mg/dL 893  887  871   BUN 8 - 23 mg/dL 14  10  11    Creatinine 0.61 - 1.24 mg/dL 9.19  9.26  9.28   Sodium 135 - 145 mmol/L 135  136  134   Potassium 3.5 - 5.1 mmol/L 4.0  4.3  4.5   Chloride 98 - 111 mmol/L 101  101  100   CO2 22 - 32 mmol/L 25  25  26    Calcium  8.9 - 10.3 mg/dL 8.6  8.7  8.8    BMP essentially normal  8.  A fib rate controlled: Monitor HR TID. Now on Eliquis.  9. Thrombocytopenia: Platelets down to 102.  --Recheck in  ma. 10. Abnormal LFTs: resolved but alb dropping     Latest Ref Rng &  Units 06/11/2024    5:55 AM 06/06/2024    9:07 AM 06/05/2024    7:00 PM  Hepatic Function  Total Protein 6.5 - 8.1 g/dL 5.7  6.3  7.2   Albumin 3.5 - 5.0 g/dL 3.0  3.5  4.2   AST 15 - 41 U/L 17  34  53   ALT 0 - 44 U/L 14  22  23    Alk Phosphatase 38 - 126 U/L 44  40  45   Total Bilirubin 0.0 - 1.2 mg/dL 0.9  1.3  1.8   Ensure high-protein twice daily 11.  Mildly comminuted distal left clavicle Fx:  Sling prn for comfort. Tylenol  for pain  May move toward sling only for amb and transfers ok per Ortho PA, f/u with Dr Sharl as OP in 2-3wks 12. HTN: Monitor BP TID--off amlodipine  currently.           Controlled  Vitals:   06/20/24 1934 06/21/24 0353  BP: 104/85 116/65  Pulse: 78 72  Resp: 18 17  Temp: 98.5 F (36.9 C) 98.2 F (36.8 C)  SpO2: 100% 94%    13.  Dyslipidemia: LDL- 127. Now on Lipitor 40 mg daily.  14.  Impaired fasting glucose: Hgb A1C- 5.4.    10/4- Last BG on BMP was 128-         LOS: 11 days A FACE TO FACE EVALUATION WAS PERFORMED  [address] 06/21/2024, 10:52 AM

## 2024-06-24 ENCOUNTER — Ambulatory Visit (INDEPENDENT_AMBULATORY_CARE_PROVIDER_SITE_OTHER): Admitting: Family Medicine

## 2024-06-24 ENCOUNTER — Telehealth: Payer: Self-pay | Admitting: *Deleted

## 2024-06-24 ENCOUNTER — Telehealth: Payer: Self-pay | Admitting: Family Medicine

## 2024-06-24 VITALS — BP 130/80 | HR 80 | Temp 98.1°F | Ht 63.0 in | Wt 150.0 lb

## 2024-06-24 DIAGNOSIS — E782 Mixed hyperlipidemia: Secondary | ICD-10-CM | POA: Diagnosis not present

## 2024-06-24 DIAGNOSIS — S42036S Nondisplaced fracture of lateral end of unspecified clavicle, sequela: Secondary | ICD-10-CM | POA: Diagnosis not present

## 2024-06-24 DIAGNOSIS — I6381 Other cerebral infarction due to occlusion or stenosis of small artery: Secondary | ICD-10-CM

## 2024-06-24 DIAGNOSIS — I4891 Unspecified atrial fibrillation: Secondary | ICD-10-CM

## 2024-06-24 DIAGNOSIS — I6930 Unspecified sequelae of cerebral infarction: Secondary | ICD-10-CM

## 2024-06-24 NOTE — Telephone Encounter (Signed)
 VO given to delay OT eval.    Copied from CRM #[id number]. Topic: General - Other >> Jun 24, 2024 12:35 PM Turkey B wrote: Reason for CRM: Brook from [facility] called, needs to delay occupation therapy for patient until 10/20

## 2024-06-24 NOTE — Telephone Encounter (Signed)
 Copied from CRM [id number]. Topic: Clinical - Home Health Verbal Orders >> Jun 24, 2024  9:33 AM Mia F wrote: Caller/Agency: Tammy from [facility] Number: [phone] Service Requested: Physical Therapy & Skilled Nursing  Frequency: 1 week 1, 2 week 5, 1 week 3; Skilled Nursing Eval  Any new concerns about the patient? No

## 2024-06-24 NOTE — Telephone Encounter (Signed)
 Verbal given

## 2024-06-24 NOTE — Progress Notes (Signed)
 Acute Office Visit  Subjective:     Patient ID: Kyle [name], male    DOB: [birth year], 89 y.o.   MRN: [id number]  Chief Complaint  Patient presents with   Hospitalization Follow-up    ADMITTED AT [facility] ON 06/05/2024 AND D/C ON 06/10/2024 FOR STROKE AND BROKEN LEFT CLAVICLE    HPI Patient is in today for ED f/u.   Hx:  BPH, CAD s/p MVR, colon polyps, post op A fib. Mitral regurgitation - S/P mitral valve repair Oct 17, 2005 by Dr. Dusty     06/05/2024-patient was found down by family with mental status changes, left facial droop, and left shoulder pain.  Patient was taken to the ED.  Patient was found to be in A-fib at admission.  CT revealed an acute infarct in the right basal ganglia with associated petechial hemorrhage as well as small acute embolic infarcts in right parietal cortex. The stroke was felt to be embolic in setting of A-fib.  Patient was started on Eliquis in the ED and Lipitor was increased to 40 mg.  Hospital Discharge 06/10/2024.  Admitted to rehab on 06/10/2024 discharged on 06/20/2024. Rehab note states Sleep-wake disruption is improving with use of melatonin as well as trazodone. He has made steady gains and requires supervision at discharge.    Patient has L sided facial droop and left-sided weakness.  Family today reports that the patient has been getting a little stronger since hospital discharge.  Family is helping the patient around-the-clock.  Family denies signs of bleeding.      PT came to the patient home for the first time yesterday.   07/01/24 - first ortho appointment for broken L clavicle.   07/22/24 -  first neurology appointment.                   Objective:    BP 130/80   Pulse 80   Temp 98.1 F (36.7 C)   Ht 5' 3 (1.6 m)   Wt 150 lb (68 kg)   SpO2 100%   BMI 26.57 kg/m    Physical Exam Vitals reviewed.  Constitutional:      Appearance: Normal appearance.     Comments: Patient talkative and smiling  during encounter.   HENT:     Head: Normocephalic and atraumatic.     Mouth/Throat:     Comments: L sided mouth droop Eyes:     Extraocular Movements: Extraocular movements intact.     Conjunctiva/sclera: Conjunctivae normal.     Pupils: Pupils are equal, round, and reactive to light.  Cardiovascular:     Rate and Rhythm: Rhythm irregular.     Heart sounds: No murmur heard. Pulmonary:     Effort: Pulmonary effort is normal.     Breath sounds: Normal breath sounds.  Musculoskeletal:        General: No deformity. Normal range of motion.     Cervical back: Normal range of motion.     Comments: Ambulating with cane.   L side weaker than R.  L shoulder sling present  Skin:    General: Skin is warm and dry.  Neurological:     Mental Status: He is alert and oriented to person, place, and time.     Motor: Weakness present.     Gait: Gait abnormal.  Psychiatric:        Mood and Affect: Mood normal.        Behavior: Behavior normal.     No  results found for any visits on 06/24/24.      Assessment & Plan:   Problem List Items Addressed This Visit       Cardiovascular and Mediastinum   Atrial fibrillation [facility])   Relevant Orders   Ambulatory referral to Cardiology   Stroke of right basal ganglia (HCC) - Primary     Musculoskeletal and Integument   Clavicle fracture     Other   Hyperlipidemia    Continue current medications.   Follow-up for any signs of bleeding - patient on eliquis.   Stroke - continue Eliquis.  Continue PT/OT.  Has first neurology appointment on 07/22/2024.   L clavicle fracture - continue to wear sling.  Has first Ortho appointment on 07/01/2024.  Afib - continue Eliquis.  Placed referral to cardiology.  No orders of the defined types were placed in this encounter.    Return for for follow up within the next month with PCP.   Kyle LELON Severin, FNP

## 2024-06-25 ENCOUNTER — Telehealth: Payer: Self-pay

## 2024-06-25 DIAGNOSIS — I639 Cerebral infarction, unspecified: Secondary | ICD-10-CM

## 2024-06-25 NOTE — Transitions of Care (Post Inpatient/ED Visit) (Signed)
 Stroke Discharge Follow-up   06/25/2024 Name:  Kyle [name] MRN:  [id number] DOB:  [birth year]  Subjective: Kyle [name] is a 89 y.o. year old male who is a primary care patient of Kyle [name] LABOR, MD An Emmi alert was received indicating patient responded to questions: Went to follow-up appointment?. I reached out by phone to follow up on the alert and spoke to Patient. He states he is doing good.  Patient saw PCP on yesterday.  He reports taking medications and has no medication concerns. He has no questions or concerns about his stroke.  Reviewed stroke symptoms.    Care Management Interventions: Reviewed dashboard, outreach to patient, addressed red alert and stroke symptoms.  Follow up plan: No further intervention required.  Kyle Dario J. Shanine Kreiger RN, MSN [facility], [facility] RN Care Manager Direct Dial: [phone]  Fax: [phone] Website: [email or web address]

## 2024-06-27 ENCOUNTER — Telehealth: Payer: Self-pay | Admitting: Family Medicine

## 2024-06-27 NOTE — Telephone Encounter (Signed)
 Copied from CRM [id number]. Topic: Clinical - Home Health Verbal Orders >> Jun 27, 2024  9:43 AM Delon DASEN wrote: Caller/Agency: Wendell with [facility] Callback Number: [phone] Service Requested: Skilled Nursing Frequency: 1x week for 9 wks with 3 as needed Any new concerns about the patient? No

## 2024-06-27 NOTE — Telephone Encounter (Signed)
 Luanne with HH made aware to continue skilled nursing.

## 2024-07-01 DIAGNOSIS — S42035A Nondisplaced fracture of lateral end of left clavicle, initial encounter for closed fracture: Secondary | ICD-10-CM | POA: Diagnosis not present

## 2024-07-01 NOTE — Progress Notes (Deleted)
 Subjective:    Patient ID: Kyle [name], male    DOB: 04/04/1935, 89 y.o.   MRN: [id number]  HPI   Pain Inventory Average Pain {NUMBERS; 0-10:5044} Pain Right Now {NUMBERS; 0-10:5044} My pain is {PAIN DESCRIPTION:21022940}  In the last 24 hours, has pain interfered with the following? General activity {NUMBERS; 0-10:5044} Relation with others {NUMBERS; 0-10:5044} Enjoyment of life {NUMBERS; 0-10:5044} What TIME of day is your pain at its worst? {time of day:24191} Sleep (in general) {BHH GOOD/FAIR/POOR:22877}  Pain is worse with: {ACTIVITIES:21022942} Pain improves with: {PAIN IMPROVES TPUY:78977056} Relief from Meds: {NUMBERS; 0-10:5044}  Family History  Problem Relation Age of Onset   COPD Father    Cancer Brother        melanoma   Heart disease Neg Hx    Colon cancer Neg Hx    Social History   Socioeconomic History   Marital status: Married    Spouse name: Not on file   Number of children: 2   Years of education: Not on file   Highest education level: Not on file  Occupational History   Occupation: Retired from Sales promotion account executive  Tobacco Use   Smoking status: Former    Types: Cigarettes   Smokeless tobacco: Never  Vaping Use   Vaping status: Never Used  Substance and Sexual Activity   Alcohol use: No   Drug use: No   Sexual activity: Not on file  Other Topics Concern   Not on file  Social History Narrative   Married. Lives in [place]   His wife is paralyzed and he cares for her - bathing her, etc   She is now under hospice care in home.   Social Drivers of Corporate investment banker Strain: Low Risk  (09/06/2023)   Overall Financial Resource Strain (CARDIA)    Difficulty of Paying Living Expenses: Not hard at all  Food Insecurity: No Food Insecurity (06/06/2024)   Hunger Vital Sign    Worried About Running Out of Food in the Last Year: Never true    Ran Out of Food in the Last Year: Never true  Transportation Needs: No Transportation Needs  (06/06/2024)   PRAPARE - Administrator, Civil Service (Medical): No    Lack of Transportation (Non-Medical): No  Physical Activity: Insufficiently Active (09/06/2023)   Exercise Vital Sign    Days of Exercise per Week: 3 days    Minutes of Exercise per Session: 30 min  Stress: No Stress Concern Present (09/06/2023)   [employer] of Occupational Health - Occupational Stress Questionnaire    Feeling of Stress : Not at all  Social Connections: Moderately Integrated (06/06/2024)   Social Connection and Isolation Panel    Frequency of Communication with Friends and Family: Three times a week    Frequency of Social Gatherings with Friends and Family: Three times a week    Attends Religious Services: 1 to 4 times per year    Active Member of Clubs or Organizations: Yes    Attends Banker Meetings: 1 to 4 times per year    Marital Status: Widowed   Past Surgical History:  Procedure Laterality Date   CARDIAC CATHETERIZATION  10/2005   COLONOSCOPY     MITRAL VALVE REPAIR  10/17/2005   Dr. Dusty   POLYPECTOMY     SKIN CANCER EXCISION Left    cheek   Past Surgical History:  Procedure Laterality Date   CARDIAC CATHETERIZATION  10/2005   COLONOSCOPY  MITRAL VALVE REPAIR  10/17/2005   Dr. Dusty   POLYPECTOMY     SKIN CANCER EXCISION Left    cheek   Past Medical History:  Diagnosis Date   Atrial fibrillation [facility])    Post operative, maintaining normal sinus rhythm on amiodarone   Benign prostatic hypertrophy    CAD (coronary artery disease)    Non obstructive by catheterization Feb 2007 prior to his surgery revealing a 40-50% LAD lesion, good LV function with EF 55-60% by last echocardiogram prior to his surgery   Hyperlipidemia    Treated   Mitral regurgitation    S/P mitral valve repair Oct 17, 2005 by Dr. Dusty   Skin cancer    There were no vitals taken for this visit.  Opioid Risk Score:   Fall Risk Score:  `1  Depression screen [facility] 2/9      06/24/2024    2:31 PM 12/05/2023    8:08 AM 09/06/2023    1:14 PM 06/04/2023    7:57 AM 12/01/2022    8:07 AM 12/01/2022    8:06 AM 08/31/2022    2:54 PM  Depression screen PHQ 2/9  Decreased Interest 0 0 0 0  0 0  Down, Depressed, Hopeless 0 0 0 0  0 0  PHQ - 2 Score 0 0 0 0  0 0  Altered sleeping 0   0 0    Tired, decreased energy 0   0 0    Change in appetite 0   0 0    Feeling bad or failure about yourself  0   0 0    Trouble concentrating 3   0 0    Moving slowly or fidgety/restless 3   0 0    Suicidal thoughts 0   0 0    PHQ-9 Score 6   0     Difficult doing work/chores Very difficult   Not difficult at all Not difficult at all      Review of Systems     Objective:   Physical Exam        Assessment & Plan:

## 2024-07-01 NOTE — Progress Notes (Unsigned)
 Subjective:    Patient ID: Kyle [name], male    DOB: 1934-11-23, 89 y.o.   MRN: [id number]  HPI: Kyle [name] is a 89 y.o. male who returns for follow up appointment for chronic pain and medication refill. states *** pain is located in  ***. rates pain ***. current exercise regime is walking and performing stretching exercises.         Pain Inventory Average Pain 0 Pain Right Now 0 My pain is no pain  LOCATION OF PAIN  No pain  BOWEL Number of stools per week: 7   BLADDER Normal    Mobility walk without assistance walk with assistance use a cane ability to climb steps?  no do you drive?  no Do you have any goals in this area?  yes  Function I need assistance with the following:  meal prep, household duties, and shopping Do you have any goals in this area?  yes  Neuro/Psych trouble walking confusion  Prior Studies x-rays [facility]  Physicians involved in your care Any changes since last visit?  no   Family History  Problem Relation Age of Onset   COPD Father    Cancer Brother        melanoma   Heart disease Neg Hx    Colon cancer Neg Hx    Social History   Socioeconomic History   Marital status: Married    Spouse name: Not on file   Number of children: 2   Years of education: Not on file   Highest education level: Not on file  Occupational History   Occupation: Retired from Sales promotion account executive  Tobacco Use   Smoking status: Former    Types: Cigarettes   Smokeless tobacco: Never  Vaping Use   Vaping status: Never Used  Substance and Sexual Activity   Alcohol use: No   Drug use: No   Sexual activity: Not on file  Other Topics Concern   Not on file  Social History Narrative   Married. Lives in [place]   His wife is paralyzed and he cares for her - bathing her, etc   She is now under hospice care in home.   Social Drivers of Corporate investment banker Strain: Low Risk  (09/06/2023)   Overall Financial Resource Strain (CARDIA)     Difficulty of Paying Living Expenses: Not hard at all  Food Insecurity: No Food Insecurity (06/06/2024)   Hunger Vital Sign    Worried About Running Out of Food in the Last Year: Never true    Ran Out of Food in the Last Year: Never true  Transportation Needs: No Transportation Needs (06/06/2024)   PRAPARE - Administrator, Civil Service (Medical): No    Lack of Transportation (Non-Medical): No  Physical Activity: Insufficiently Active (09/06/2023)   Exercise Vital Sign    Days of Exercise per Week: 3 days    Minutes of Exercise per Session: 30 min  Stress: No Stress Concern Present (09/06/2023)   [employer] of Occupational Health - Occupational Stress Questionnaire    Feeling of Stress : Not at all  Social Connections: Moderately Integrated (06/06/2024)   Social Connection and Isolation Panel    Frequency of Communication with Friends and Family: Three times a week    Frequency of Social Gatherings with Friends and Family: Three times a week    Attends Religious Services: 1 to 4 times per year    Active Member of Clubs or Organizations:  Yes    Attends Club or Organization Meetings: 1 to 4 times per year    Marital Status: Widowed   Past Surgical History:  Procedure Laterality Date   CARDIAC CATHETERIZATION  10/2005   COLONOSCOPY     MITRAL VALVE REPAIR  10/17/2005   Dr. Dusty   POLYPECTOMY     SKIN CANCER EXCISION Left    cheek   Past Medical History:  Diagnosis Date   Atrial fibrillation [facility])    Post operative, maintaining normal sinus rhythm on amiodarone   Benign prostatic hypertrophy    CAD (coronary artery disease)    Non obstructive by catheterization Feb 2007 prior to his surgery revealing a 40-50% LAD lesion, good LV function with EF 55-60% by last echocardiogram prior to his surgery   Hyperlipidemia    Treated   Mitral regurgitation    S/P mitral valve repair Oct 17, 2005 by Dr. Dusty   Skin cancer    There were no vitals taken for this  visit.  Opioid Risk Score:   Fall Risk Score:  `1  Depression screen [facility] 2/9     06/24/2024    2:31 PM 12/05/2023    8:08 AM 09/06/2023    1:14 PM 06/04/2023    7:57 AM 12/01/2022    8:07 AM 12/01/2022    8:06 AM 08/31/2022    2:54 PM  Depression screen PHQ 2/9  Decreased Interest 0 0 0 0  0 0  Down, Depressed, Hopeless 0 0 0 0  0 0  PHQ - 2 Score 0 0 0 0  0 0  Altered sleeping 0   0 0    Tired, decreased energy 0   0 0    Change in appetite 0   0 0    Feeling bad or failure about yourself  0   0 0    Trouble concentrating 3   0 0    Moving slowly or fidgety/restless 3   0 0    Suicidal thoughts 0   0 0    PHQ-9 Score 6   0     Difficult doing work/chores Very difficult   Not difficult at all Not difficult at all      Review of Systems  Musculoskeletal:  Positive for gait problem.  Psychiatric/Behavioral:  Positive for confusion.   All other systems reviewed and are negative.      Objective:   Physical Exam        Assessment & Plan:

## 2024-07-02 ENCOUNTER — Encounter: Attending: Registered Nurse | Admitting: Registered Nurse

## 2024-07-02 ENCOUNTER — Encounter: Payer: Self-pay | Admitting: Registered Nurse

## 2024-07-02 VITALS — BP 123/72 | HR 77 | Ht 63.0 in | Wt 156.0 lb

## 2024-07-02 DIAGNOSIS — I4891 Unspecified atrial fibrillation: Secondary | ICD-10-CM | POA: Diagnosis not present

## 2024-07-02 DIAGNOSIS — E7849 Other hyperlipidemia: Secondary | ICD-10-CM | POA: Insufficient documentation

## 2024-07-02 DIAGNOSIS — I6381 Other cerebral infarction due to occlusion or stenosis of small artery: Secondary | ICD-10-CM | POA: Diagnosis not present

## 2024-07-03 ENCOUNTER — Encounter: Payer: Self-pay | Admitting: Registered Nurse

## 2024-07-07 ENCOUNTER — Other Ambulatory Visit: Payer: Self-pay | Admitting: Family Medicine

## 2024-07-07 NOTE — Telephone Encounter (Unsigned)
 Copied from CRM [id number]. Topic: Clinical - Medication Refill >> Jul 07, 2024 10:30 AM Delon DASEN wrote: Medication: apixaban (ELIQUIS) 5 MG TABS tablet- prescribed at hospital during inpatient stay  Has the patient contacted their pharmacy? No (Agent: If no, request that the patient contact the pharmacy for the refill. If patient does not wish to contact the pharmacy document the reason why and proceed with request.) (Agent: If yes, when and what did the pharmacy advise?)  This is the patient's preferred pharmacy:  [facility], [place] - [address] [place] [address] Phone: [phone] Fax: [phone]    Is this the correct pharmacy for this prescription? Yes If no, delete pharmacy and type the correct one.   Has the prescription been filled recently? Yes  Is the patient out of the medication? No  Has the patient been seen for an appointment in the last year OR does the patient have an upcoming appointment? Yes  Can we respond through MyChart? No  Agent: Please be advised that Rx refills may take up to 3 business days. We ask that you follow-up with your pharmacy.

## 2024-07-09 MED ORDER — APIXABAN 5 MG PO TABS: 5.0000 mg | ORAL_TABLET | Freq: Two times a day (BID) | ORAL | 0 refills | Status: DC

## 2024-07-09 NOTE — Telephone Encounter (Signed)
 PER CRM- Patient called.. said pharmacy wants to charge $300 -- is there an alternative

## 2024-07-11 NOTE — Telephone Encounter (Signed)
 Please let him know that unfortunately the only real alternative is warfarin which would mean that he have to come in for regular INR visits and if he does want to try and make the transition to that then he needs to come in for a visit so we can discuss it.

## 2024-07-14 ENCOUNTER — Ambulatory Visit: Admitting: Family Medicine

## 2024-07-15 ENCOUNTER — Telehealth: Payer: Self-pay

## 2024-07-15 NOTE — Telephone Encounter (Signed)
 Noted

## 2024-07-15 NOTE — Telephone Encounter (Signed)
 Copied from CRM #[id number]. Topic: General - Call Back - No Documentation >> Jul 14, 2024  4:20 PM Geneva B wrote: Reason for CRM: [facility]  home health patient had a fall 07/12/2024  without injury [phone]

## 2024-07-16 ENCOUNTER — Other Ambulatory Visit (HOSPITAL_COMMUNITY): Payer: Self-pay

## 2024-07-21 NOTE — Progress Notes (Unsigned)
 PATIENT: Kyle [name] DOB: 1935/05/04  REASON FOR VISIT: follow up HISTORY FROM: patient PRIMARY NEUROLOGIST: Dr. Rosemarie  No chief complaint on file.    HISTORY OF PRESENT ILLNESS: Today   Kyle [name] is a 89 y.o. male who has been followed in this office for ***. Returns today for follow-up.   Imaging:  MRI BRAIN:  IMPRESSION: 1. Acute infarct in the right basal ganglia with associated petechial hemorrhage. 2. Small acute embolic infarcts in the right parietal cortex. 3. No significant cerebral swelling or mass effect. 4. Moderate chronic periventricular white matter disease.  CTA head/neck: IMPRESSION: 1. No large vessel occlusion or aneurysm in the head or neck. 2. Bilateral carotid bulb atherosclerosis with less than 20% luminal stenosis. 3. Fetal-type origin of both posterior cerebral arteries (anatomic variant).   FU: Labs: Carotid dopplers? ECHO: Discharge note Work? OSA?   HISTORY (copied from Hospital)Kyle [name] is a 89 y.o. male with history of mitral valve prolapse with mitral regurgitation status postrepair in 2007 complicated by postop A-fib no longer on anticoagulation, stroke, CAD, hypertension, hyperlipidemia and BPH admitted after a fall with left shoulder pain, confusion and left facial droop.  He was found on MRI to have an acute infarct in the right basal ganglia.  He was found to be in A-fib on admission.  After discussion with cardiology, patient is eligible for DOAC for management of his A-fib but will need to wait a few days given petechial hemorrhage seen on MRI.  NIH on Admission 3   Stroke:  left BG large infarct, etiology: Embolic in the setting of A-fib not on AC CT head acute early subacute right basal ganglia infarct CTA head & neck no LVO or hemodynamically significant stenosis, bilateral fetal PCAs MRI acute infarct in right basal ganglia with associated petechial hemorrhage, small acute embolic infarct in right parietal  cortex, moderate chronic periventricular white matter disease 2D Echo EF 60 to 65%.  Left atrium mildly dilated. LDL 127 HgbA1c 5.4 VTE prophylaxis - SCDs aspirin  81 mg daily prior to admission, now on aspirin  81 mg daily, will switch to Eliquis today.  Therapy recommendations:  CIR Disposition: Pending    Atrial fibrillation Home Meds: None History of MVR 2007 with postop A-fib Now admission with him on telemetry and EKG Continue telemetry monitoring Start Eliquis today, discussed importance of compliance with this medication with patient and family   Hypertension Home meds: Amlodipine  5 mg daily Stable Long-term BP goal normotensive   Hyperlipidemia Home meds: Atorvastatin  20 mg daily  LDL 127, goal < 70 Increase to Lipitor 40 Continue statin at discharge   Other Stroke Risk Factors CAD History of stroke, details not clear   Other Active Problems BPH Mild thrombocytopenia, platelet 102    REVIEW OF SYSTEMS: Out of a complete 14 system review of symptoms, the patient complains only of the following symptoms, and all other reviewed systems are negative.  ALLERGIES: Allergies  Allergen Reactions   Penicillins     REACTION: dizziness/syncope    HOME MEDICATIONS: Outpatient Medications Prior to Visit  Medication Sig Dispense Refill   apixaban (ELIQUIS) 5 MG TABS tablet Take 1 tablet (5 mg total) by mouth 2 (two) times daily. 60 tablet 0   atorvastatin  (LIPITOR) 40 MG tablet Take 1 tablet (40 mg total) by mouth daily. 30 tablet 0   famotidine (PEPCID) 20 MG tablet Take 1 tablet (20 mg total) by mouth daily. 30 tablet 0   fish oil-omega-3  fatty acids 1000 MG capsule Take 1 capsule by mouth 2 (two) times daily.     melatonin 5 MG TABS Take 1 tablet (5 mg total) by mouth at bedtime. 30 tablet 0   traZODone (DESYREL) 50 MG tablet Take 0.5-1 tablets (25-50 mg total) by mouth at bedtime as needed for sleep. 15 tablet 0   No facility-administered medications prior to  visit.    PAST MEDICAL HISTORY: Past Medical History:  Diagnosis Date   Atrial fibrillation [facility])    Post operative, maintaining normal sinus rhythm on amiodarone   Benign prostatic hypertrophy    CAD (coronary artery disease)    Non obstructive by catheterization Feb 2007 prior to his surgery revealing a 40-50% LAD lesion, good LV function with EF 55-60% by last echocardiogram prior to his surgery   Hyperlipidemia    Treated   Mitral regurgitation    S/P mitral valve repair Oct 17, 2005 by Dr. Dusty   Skin cancer     PAST SURGICAL HISTORY: Past Surgical History:  Procedure Laterality Date   CARDIAC CATHETERIZATION  10/2005   COLONOSCOPY     MITRAL VALVE REPAIR  10/17/2005   Dr. Dusty   POLYPECTOMY     SKIN CANCER EXCISION Left    cheek    FAMILY HISTORY: Family History  Problem Relation Age of Onset   COPD Father    Cancer Brother        melanoma   Heart disease Neg Hx    Colon cancer Neg Hx     SOCIAL HISTORY: Social History   Socioeconomic History   Marital status: Married    Spouse name: Not on file   Number of children: 2   Years of education: Not on file   Highest education level: Not on file  Occupational History   Occupation: Retired from sales promotion account executive  Tobacco Use   Smoking status: Former    Types: Cigarettes   Smokeless tobacco: Never  Vaping Use   Vaping status: Never Used  Substance and Sexual Activity   Alcohol use: No   Drug use: No   Sexual activity: Not on file  Other Topics Concern   Not on file  Social History Narrative   Married. Lives in [place]   His wife is paralyzed and he cares for her - bathing her, etc   She is now under hospice care in home.   Social Drivers of Corporate Investment Banker Strain: Low Risk  (09/06/2023)   Overall Financial Resource Strain (CARDIA)    Difficulty of Paying Living Expenses: Not hard at all  Food Insecurity: No Food Insecurity (06/06/2024)   Hunger Vital Sign    Worried About Running Out of  Food in the Last Year: Never true    Ran Out of Food in the Last Year: Never true  Transportation Needs: No Transportation Needs (06/06/2024)   PRAPARE - Administrator, Civil Service (Medical): No    Lack of Transportation (Non-Medical): No  Physical Activity: Insufficiently Active (09/06/2023)   Exercise Vital Sign    Days of Exercise per Week: 3 days    Minutes of Exercise per Session: 30 min  Stress: No Stress Concern Present (09/06/2023)   [employer] of Occupational Health - Occupational Stress Questionnaire    Feeling of Stress : Not at all  Social Connections: Moderately Integrated (06/06/2024)   Social Connection and Isolation Panel    Frequency of Communication with Friends and Family: Three times a week  Frequency of Social Gatherings with Friends and Family: Three times a week    Attends Religious Services: 1 to 4 times per year    Active Member of Clubs or Organizations: Yes    Attends Banker Meetings: 1 to 4 times per year    Marital Status: Widowed  Intimate Partner Violence: Not At Risk (06/06/2024)   Humiliation, Afraid, Rape, and Kick questionnaire    Fear of Current or Ex-Partner: No    Emotionally Abused: No    Physically Abused: No    Sexually Abused: No      PHYSICAL EXAM  There were no vitals filed for this visit. There is no height or weight on file to calculate BMI.  Generalized: Well developed, in no acute distress   Neurological examination  Mentation: Alert oriented to time, place, history taking. Follows all commands speech and language fluent Cranial nerve II-XII: Pupils were equal round reactive to light. Extraocular movements were full, visual field were full on confrontational test. Facial sensation and strength were normal. Uvula tongue midline. Head turning and shoulder shrug  were normal and symmetric. Motor: The motor testing reveals 5 over 5 strength of all 4 extremities. Good symmetric motor tone is noted  throughout.  Sensory: Sensory testing is intact to soft touch on all 4 extremities. No evidence of extinction is noted.  Coordination: Cerebellar testing reveals good finger-nose-finger and heel-to-shin bilaterally.  Gait and station: Gait is normal. Tandem gait is normal. Romberg is negative. No drift is seen.  Reflexes: Deep tendon reflexes are symmetric and normal bilaterally.   DIAGNOSTIC DATA (LABS, IMAGING, TESTING) - I reviewed patient records, labs, notes, testing and imaging myself where available.  Lab Results  Component Value Date   WBC 9.3 06/19/2024   HGB 12.1 (L) 06/19/2024   HCT 35.5 (L) 06/19/2024   MCV 89.6 06/19/2024   PLT 307 06/19/2024      Component Value Date/Time   NA 135 06/19/2024 0533   NA 138 12/05/2023 0827   K 4.0 06/19/2024 0533   CL 101 06/19/2024 0533   CO2 25 06/19/2024 0533   GLUCOSE 106 (H) 06/19/2024 0533   BUN 14 06/19/2024 0533   BUN 10 12/05/2023 0827   CREATININE 0.80 06/19/2024 0533   CREATININE 0.81 02/17/2013 1230   CALCIUM  8.6 (L) 06/19/2024 0533   PROT 5.7 (L) 06/11/2024 0555   PROT 6.4 12/05/2023 0827   ALBUMIN 3.0 (L) 06/11/2024 0555   ALBUMIN 4.3 12/05/2023 0827   AST 17 06/11/2024 0555   ALT 14 06/11/2024 0555   ALKPHOS 44 06/11/2024 0555   BILITOT 0.9 06/11/2024 0555   BILITOT 0.5 12/05/2023 0827   GFRNONAA >60 06/19/2024 0533   GFRNONAA 86 02/17/2013 1230   GFRAA 98 05/21/2020 1151   GFRAA >89 02/17/2013 1230   Lab Results  Component Value Date   CHOL 183 06/06/2024   HDL 43 06/06/2024   LDLCALC 127 (H) 06/06/2024   TRIG 66 06/06/2024   CHOLHDL 4.3 06/06/2024   Lab Results  Component Value Date   HGBA1C 5.4 06/06/2024   No results found for: VITAMINB12 No results found for: TSH    ASSESSMENT AND PLAN 89 y.o. year old male  has a past medical history of Atrial fibrillation (HCC), Benign prostatic hypertrophy, CAD (coronary artery disease), Hyperlipidemia, Mitral regurgitation, and Skin cancer. here  with ***     Continue {anticoagulants:31417}  and ***  for secondary stroke prevention.   Discussed secondary stroke prevention measures and importance  of close PCP follow up for aggressive stroke risk factor management. I have gone over the pathophysiology of stroke, warning signs and symptoms, risk factors and their management in some detail with instructions to go to the closest emergency room for symptoms of concern. HTN: BP goal <130/90.  Stable on *** per PCP HLD: LDL goal <70. Recent LDL ***.  DMII: A1c goal<7.0. Recent A1c ***.  Encouraged patient to monitor diet and encouraged exercise FU with our office ***  No orders of the defined types were placed in this encounter.  No orders of the defined types were placed in this encounter.     Duwaine Russell, MSN, NP-C 07/21/2024, 4:25 PM [facility] [address], [address] [place], [place] [address] [phone]

## 2024-07-22 ENCOUNTER — Encounter: Payer: Self-pay | Admitting: Adult Health

## 2024-07-22 ENCOUNTER — Ambulatory Visit (INDEPENDENT_AMBULATORY_CARE_PROVIDER_SITE_OTHER): Admitting: Adult Health

## 2024-07-22 VITALS — BP 140/82 | HR 84 | Ht 63.0 in | Wt 155.0 lb

## 2024-07-22 DIAGNOSIS — I10 Essential (primary) hypertension: Secondary | ICD-10-CM

## 2024-07-22 DIAGNOSIS — R413 Other amnesia: Secondary | ICD-10-CM | POA: Diagnosis not present

## 2024-07-22 DIAGNOSIS — E782 Mixed hyperlipidemia: Secondary | ICD-10-CM | POA: Diagnosis not present

## 2024-07-22 DIAGNOSIS — I6381 Other cerebral infarction due to occlusion or stenosis of small artery: Secondary | ICD-10-CM

## 2024-07-22 NOTE — Patient Instructions (Signed)
 Your Plan:  Continue Eliquis   Blood pressure goal <130/90 Cholesterol LDL goal <70 Diabetes goal A1c <7 Monitor diet and try to exercise   Thank you for coming to see [place] at [facility]. I hope we have been able to provide you high quality care today.  You may receive a patient satisfaction survey over the next few weeks. We would appreciate your feedback and comments so that we may continue to improve ourselves and the health of our patients.

## 2024-07-24 ENCOUNTER — Encounter: Payer: Self-pay | Admitting: Cardiology

## 2024-07-24 ENCOUNTER — Ambulatory Visit: Attending: Cardiology | Admitting: Cardiology

## 2024-07-24 ENCOUNTER — Ambulatory Visit: Admitting: Family Medicine

## 2024-07-24 ENCOUNTER — Encounter: Payer: Self-pay | Admitting: Family Medicine

## 2024-07-24 VITALS — BP 130/80 | HR 83 | Ht 63.0 in | Wt 153.0 lb

## 2024-07-24 VITALS — BP 137/77 | HR 98 | Ht 63.0 in | Wt 155.0 lb

## 2024-07-24 DIAGNOSIS — I480 Paroxysmal atrial fibrillation: Secondary | ICD-10-CM | POA: Diagnosis not present

## 2024-07-24 DIAGNOSIS — D6869 Other thrombophilia: Secondary | ICD-10-CM

## 2024-07-24 DIAGNOSIS — I10 Essential (primary) hypertension: Secondary | ICD-10-CM | POA: Diagnosis not present

## 2024-07-24 DIAGNOSIS — Z8673 Personal history of transient ischemic attack (TIA), and cerebral infarction without residual deficits: Secondary | ICD-10-CM | POA: Diagnosis not present

## 2024-07-24 DIAGNOSIS — I4819 Other persistent atrial fibrillation: Secondary | ICD-10-CM

## 2024-07-24 DIAGNOSIS — E782 Mixed hyperlipidemia: Secondary | ICD-10-CM

## 2024-07-24 MED ORDER — ATORVASTATIN CALCIUM 40 MG PO TABS: 40.0000 mg | ORAL_TABLET | Freq: Every day | ORAL | 3 refills | Status: AC

## 2024-07-24 MED ORDER — FAMOTIDINE 20 MG PO TABS: 20.0000 mg | ORAL_TABLET | Freq: Every day | ORAL | 3 refills | Status: AC

## 2024-07-24 MED ORDER — APIXABAN 5 MG PO TABS: 5.0000 mg | ORAL_TABLET | Freq: Two times a day (BID) | ORAL | 3 refills | Status: AC

## 2024-07-24 NOTE — Progress Notes (Signed)
 Clinical Summary Kyle [name] is a 89 y.o.male seen today as a new patient for the following medical problems.    Previously followed by Dr Lavona, last visit in 2015.    1.History of MV repair - MV repair 10/2005   2. Nonobstructive CAD  3. Afib - initially had postop afib in 2007 after MV repair, no clear recurrences over the years at f/u - noted during recent admission 05/2024 with CVA - was started on eliquis, no bleeding on eliquis.  - has been self rate controlled  4. Acute CVA - admit 05/2024  5. HLD - 05/2024 TC 183 TG 66 HDL 43 LDL 127 - during admissoin atorva increased 20mg  to 40mg .   Past Medical History:  Diagnosis Date   Atrial fibrillation [facility])    Post operative, maintaining normal sinus rhythm on amiodarone   Benign prostatic hypertrophy    CAD (coronary artery disease)    Non obstructive by catheterization Feb 2007 prior to his surgery revealing a 40-50% LAD lesion, good LV function with EF 55-60% by last echocardiogram prior to his surgery   Hyperlipidemia    Treated   Mitral regurgitation    S/P mitral valve repair Oct 17, 2005 by Dr. Dusty   Skin cancer      Allergies  Allergen Reactions   Penicillins     REACTION: dizziness/syncope     Current Outpatient Medications  Medication Sig Dispense Refill   apixaban (ELIQUIS) 5 MG TABS tablet Take 1 tablet (5 mg total) by mouth 2 (two) times daily. 60 tablet 0   atorvastatin  (LIPITOR) 40 MG tablet Take 1 tablet (40 mg total) by mouth daily. 30 tablet 0   famotidine (PEPCID) 20 MG tablet Take 1 tablet (20 mg total) by mouth daily. 30 tablet 0   fish oil-omega-3 fatty acids  1000 MG capsule Take 1 capsule by mouth 2 (two) times daily.     No current facility-administered medications for this visit.     Past Surgical History:  Procedure Laterality Date   CARDIAC CATHETERIZATION  10/2005   COLONOSCOPY     MITRAL VALVE REPAIR  10/17/2005   Dr. Dusty   POLYPECTOMY     SKIN CANCER EXCISION Left     cheek     Allergies  Allergen Reactions   Penicillins     REACTION: dizziness/syncope      Family History  Problem Relation Age of Onset   COPD Father    Cancer Brother        melanoma   Heart disease Neg Hx    Colon cancer Neg Hx      Social History Kyle [name] reports that he has quit smoking. His smoking use included cigarettes. He has never used smokeless tobacco. Kyle [name] reports no history of alcohol use.    Physical Examination Today's Vitals   07/24/24 0910  BP: 130/80  Pulse: 83  SpO2: 100%  Weight: 153 lb (69.4 kg)  Height: 5' 3 (1.6 m)  PainSc: 0-No pain   Body mass index is 27.1 kg/m.  Gen: resting comfortably, no acute distress HEENT: no scleral icterus, pupils equal round and reactive, no palptable cervical adenopathy,  CV: irreg, no mrg, no jvd Resp: Clear to auscultation bilaterally GI: abdomen is soft, non-tender, non-distended, normal bowel sounds, no hepatosplenomegaly MSK: extremities are warm, no edema.  Skin: warm, no rash Neuro:  no focal deficits Psych: appropriate affect   Diagnostic Studies  05/2024 echo 1. Left ventricular ejection fraction,  by estimation, is 60 to 65%. The  left ventricle has normal function. The left ventricle has no regional  wall motion abnormalities.   2. Right ventricular systolic function is normal. The right ventricular  size is normal.   3. Left atrial size was mildly dilated.   4. Right atrial size was mildly dilated.   5. The mitral valve has been repaired/replaced. No evidence of mitral  valve regurgitation. No evidence of mitral stenosis. There is a prosthetic  annuloplasty ring present in the mitral position. Procedure Date:  10/17/2005.   6. The aortic valve is normal in structure. Aortic valve regurgitation is  mild. No aortic stenosis is present.   7. The inferior vena cava is normal in size with greater than 50%  respiratory variability, suggesting right atrial pressure of 3 mmHg.        Assessment and Plan   1.Persistent afib - new diagnosis during recent admission 05/2024 with CVA - he is self rate controlled and asymptomatic, no indication to pursue restoration of SR - continue eliquis for stroke prevention  2. History of MV repair - 05/2024 echo with normal MV, doing well nearly 20 years after repair  3. HLD - recent increase in statin during admission with CVA, defer repeat labs to pcp   Check with pharmacy if ok to restart OTC osteo biflex with his current meds per family request  F/u 6 months     Dorn PHEBE Ross, [name]

## 2024-07-24 NOTE — Patient Instructions (Signed)
 Medication Instructions:   Eliquis, Atorvastatin , Pepcid refilled today for 90 day supply  Continue all other medications.     Labwork:  none  Testing/Procedures:  none  Follow-Up:  6 months   Any Other Special Instructions Will Be Listed Below (If Applicable).   If you need a refill on your cardiac medications before your next appointment, please call your pharmacy.

## 2024-07-24 NOTE — Progress Notes (Signed)
 BP 137/77   Pulse 98   Ht 5' 3 (1.6 m)   Wt 155 lb (70.3 kg)   SpO2 99%   BMI 27.46 kg/m    Subjective:   Patient ID: Kyle [name], male    DOB: 1934/09/19, 89 y.o.   MRN: [id number]  HPI: Kyle [name] is a 89 y.o. male presenting on 07/24/2024 for Medical Management of Chronic Issues and Stroke follow up (Seen Oneil 42m ago)   Discussed the use of AI scribe software for clinical note transcription with the patient, who gave verbal consent to proceed.  History of Present Illness   Kyle [name] is an 89 year old male with a history of stroke and atrial fibrillation who presents for follow-up regarding his stroke recovery and medication management. He is accompanied by his two daughters.  Post-stroke neurological deficits - History of stroke affecting the basal ganglia with initial presentation of left-sided weakness and facial droop - Improvement in left-sided motor function, including ability to buckle seatbelt and smile - Persistent visual disturbance in the left eye described as a foggy haze, present prior to the stroke  Atrial fibrillation and anticoagulation - Atrial fibrillation managed with Eliquis  Blood pressure and cardiovascular risk management - Blood pressure currently 137/77 mmHg - Cholesterol management with atorvastatin  - Regular use of fish oil supplements  Gastrointestinal symptoms - Intermittent use of Pepcid as needed  Mobility and musculoskeletal issues - Uses a walking stick for stability, especially outdoors - History of broken collarbone and toes, contributing to mobility limitations  Tobacco use and weight changes - History of smoking, now quit - Weight gain following smoking cessation     Goals per neurology HTN: BP goal <130/90.   HLD: LDL goal <70.   DMII: A1c goal<7.0.      Relevant past medical, surgical, family and social history reviewed and updated as indicated. Interim medical history since our last visit reviewed. Allergies and  medications reviewed and updated.  Review of Systems  Constitutional:  Negative for chills and fever.  Eyes:  Negative for visual disturbance.  Respiratory:  Negative for shortness of breath and wheezing.   Cardiovascular:  Negative for chest pain and leg swelling.  Musculoskeletal:  Negative for back pain and gait problem.  Skin:  Negative for rash.  Neurological:  Positive for weakness. Negative for dizziness and light-headedness.  All other systems reviewed and are negative.   Per HPI unless specifically indicated above   Allergies as of 07/24/2024       Reactions   Penicillins    REACTION: dizziness/syncope        Medication List        Accurate as of July 24, 2024  4:17 PM. If you have any questions, ask your nurse or doctor.          apixaban 5 MG Tabs tablet Commonly known as: ELIQUIS Take 1 tablet (5 mg total) by mouth 2 (two) times daily.   atorvastatin  40 MG tablet Commonly known as: LIPITOR Take 1 tablet (40 mg total) by mouth daily.   famotidine 20 MG tablet Commonly known as: PEPCID Take 1 tablet (20 mg total) by mouth daily.   fish oil-omega-3 fatty acids  1000 MG capsule Take 1 capsule by mouth 2 (two) times daily.         Objective:   BP 137/77   Pulse 98   Ht 5' 3 (1.6 m)   Wt 155 lb (70.3 kg)   SpO2 99%  BMI 27.46 kg/m   Wt Readings from Last 3 Encounters:  07/24/24 155 lb (70.3 kg)  07/24/24 153 lb (69.4 kg)  07/22/24 155 lb (70.3 kg)    Physical Exam Physical Exam   VITALS: P- 98, BP- 137/77 CHEST: Lungs clear to auscultation bilaterally. CARDIOVASCULAR: Heart regular rhythm, no murmurs. MUSCULOSKELETAL: Hand grip strength normal bilaterally. Strength symmetric bilaterally with slight weakness in left leg. NEUROLOGICAL: Cranial nerves II-XII grossly intact. Motor strength normal in upper extremities.  Slight decreased memory per daughter's        Assessment & Plan:   Problem List Items Addressed This Visit        Cardiovascular and Mediastinum   Atrial fibrillation (HCC)   Relevant Orders   CBC With Diff/Platelet   CMP14+EGFR   Lipid panel   Hypertension   Relevant Orders   CBC With Diff/Platelet   CMP14+EGFR   Lipid panel     Other   Hyperlipidemia   Relevant Orders   CBC With Diff/Platelet   CMP14+EGFR   Lipid panel   Status post CVA - Primary   Relevant Orders   CBC With Diff/Platelet   CMP14+EGFR   Lipid panel       Cerebral infarction with residual left-sided weakness and memory changes Recent stroke in the basal ganglion with residual left-sided weakness and memory changes. Improvement in left-sided weakness, particularly in tasks such as buckling a seatbelt and smiling. Slight weakness in the left leg persists, but overall strength is improving. Memory changes noted as a side effect post-stroke. - Continue current rehabilitation exercises to improve strength and stability. - Use a cane or walking stick for stability when walking outside. - Avoid activities that may lead to falls, such as using a tractor without supervision.  Paroxysmal atrial fibrillation Managed with Eliquis. Heart rate is well-controlled at 98 bpm. - Continue Eliquis for atrial fibrillation management.  Essential hypertension Blood pressure goal set by neurology is below 130/90 mmHg. Current reading is 137/77 mmHg, slightly above the target. Hesitant to start new antihypertensive medication due to proximity to target. - Monitor blood pressure regularly to ensure it remains below 130/90 mmHg. - Will discuss blood pressure management with neurology at the next visit.  Hyperlipidemia Managed with atorvastatin . LDL cholesterol goal is less than 7 mg/dL. Previous issues with muscle aches from atorvastatin  noted, but continuation is advised due to stroke history. - Ordered blood work to check LDL cholesterol levels. - Continue atorvastatin  therapy. - Will adjust atorvastatin  if necessary based on blood  work results.          Follow up plan: Return in about 3 months (around 10/24/2024), or if symptoms worsen or fail to improve, for Hypertension and CVA follow-up.  Counseling provided for all of the vaccine components Orders Placed This Encounter  Procedures   CBC With Diff/Platelet   CMP14+EGFR   Lipid panel    Fonda Levins, MD [facility] 07/24/2024, 4:17 PM

## 2024-07-25 LAB — CMP14+EGFR
ALT: 13 IU/L (ref 0–44)
AST: 17 IU/L (ref 0–40)
Albumin: 4.2 g/dL (ref 3.7–4.7)
Alkaline Phosphatase: 63 IU/L (ref 48–129)
BUN/Creatinine Ratio: 14 (ref 10–24)
BUN: 11 mg/dL (ref 8–27)
Bilirubin Total: 0.4 mg/dL (ref 0.0–1.2)
CO2: 26 mmol/L (ref 20–29)
Calcium: 9 mg/dL (ref 8.6–10.2)
Chloride: 103 mmol/L (ref 96–106)
Creatinine, Ser: 0.77 mg/dL (ref 0.76–1.27)
Globulin, Total: 2.3 g/dL (ref 1.5–4.5)
Glucose: 126 mg/dL — ABNORMAL HIGH (ref 70–99)
Potassium: 4 mmol/L (ref 3.5–5.2)
Sodium: 141 mmol/L (ref 134–144)
Total Protein: 6.5 g/dL (ref 6.0–8.5)
eGFR: 87 mL/min/1.73 (ref >59)

## 2024-07-25 LAB — CBC WITH DIFF/PLATELET
Basophils Absolute: 0 x10E3/uL (ref 0.0–0.2)
Basos: 0 %
EOS (ABSOLUTE): 0.2 x10E3/uL (ref 0.0–0.4)
Eos: 2 %
Hematocrit: 39.1 % (ref 37.5–51.0)
Hemoglobin: 12.9 g/dL — ABNORMAL LOW (ref 13.0–17.7)
Immature Grans (Abs): 0 x10E3/uL (ref 0.0–0.1)
Immature Granulocytes: 0 %
Lymphocytes Absolute: 3.1 x10E3/uL (ref 0.7–3.1)
Lymphs: 41 %
MCH: 31.2 pg (ref 26.6–33.0)
MCHC: 33 g/dL (ref 31.5–35.7)
MCV: 95 fL (ref 79–97)
Monocytes Absolute: 0.6 x10E3/uL (ref 0.1–0.9)
Monocytes: 8 %
Neutrophils Absolute: 3.8 x10E3/uL (ref 1.4–7.0)
Neutrophils: 49 %
Platelets: 292 x10E3/uL (ref 150–450)
RBC: 4.13 x10E6/uL — ABNORMAL LOW (ref 4.14–5.80)
RDW: 12.6 % (ref 11.6–15.4)
WBC: 7.7 x10E3/uL (ref 3.4–10.8)

## 2024-07-25 LAB — LIPID PANEL
Chol/HDL Ratio: 3.6 ratio (ref 0.0–5.0)
Cholesterol, Total: 168 mg/dL (ref 100–199)
HDL: 47 mg/dL (ref >39)
LDL Chol Calc (NIH): 94 mg/dL (ref 0–99)
Triglycerides: 158 mg/dL — ABNORMAL HIGH (ref 0–149)
VLDL Cholesterol Cal: 27 mg/dL (ref 5–40)

## 2024-07-28 ENCOUNTER — Ambulatory Visit

## 2024-07-28 DIAGNOSIS — I10 Essential (primary) hypertension: Secondary | ICD-10-CM

## 2024-07-28 DIAGNOSIS — Z87438 Personal history of other diseases of male genital organs: Secondary | ICD-10-CM

## 2024-07-28 DIAGNOSIS — I69352 Hemiplegia and hemiparesis following cerebral infarction affecting left dominant side: Secondary | ICD-10-CM | POA: Diagnosis not present

## 2024-07-28 DIAGNOSIS — I2510 Atherosclerotic heart disease of native coronary artery without angina pectoris: Secondary | ICD-10-CM

## 2024-07-28 DIAGNOSIS — E785 Hyperlipidemia, unspecified: Secondary | ICD-10-CM

## 2024-07-28 DIAGNOSIS — Z860100 Personal history of colon polyps, unspecified: Secondary | ICD-10-CM

## 2024-07-28 DIAGNOSIS — S42025D Nondisplaced fracture of shaft of left clavicle, subsequent encounter for fracture with routine healing: Secondary | ICD-10-CM

## 2024-07-28 DIAGNOSIS — Z952 Presence of prosthetic heart valve: Secondary | ICD-10-CM

## 2024-07-28 DIAGNOSIS — Z7901 Long term (current) use of anticoagulants: Secondary | ICD-10-CM

## 2024-07-28 DIAGNOSIS — Z556 Problems related to health literacy: Secondary | ICD-10-CM

## 2024-07-28 DIAGNOSIS — R32 Unspecified urinary incontinence: Secondary | ICD-10-CM

## 2024-07-28 DIAGNOSIS — Z87891 Personal history of nicotine dependence: Secondary | ICD-10-CM

## 2024-07-30 ENCOUNTER — Telehealth: Payer: Self-pay

## 2024-07-30 ENCOUNTER — Ambulatory Visit: Payer: Self-pay | Admitting: Family Medicine

## 2024-07-30 NOTE — Telephone Encounter (Signed)
 Copied from CRM #[id number]. Topic: Clinical - Lab/Test Results >> Jul 30, 2024  9:02 AM Miquel SAILOR wrote: Reason for CRM: PT daughter Marval calling on update for 11/13 lab result for PT. Needs call back [phone]

## 2024-07-30 NOTE — Telephone Encounter (Signed)
 Please review lab results. Will call patients daughter with results once PCP has had a chance to review them.

## 2024-08-01 ENCOUNTER — Ambulatory Visit: Payer: Self-pay

## 2024-08-01 ENCOUNTER — Ambulatory Visit: Admitting: Nurse Practitioner

## 2024-08-01 ENCOUNTER — Encounter: Payer: Self-pay | Admitting: Nurse Practitioner

## 2024-08-01 VITALS — BP 132/78 | HR 83 | Temp 97.7°F | Ht 63.0 in | Wt 153.0 lb

## 2024-08-01 DIAGNOSIS — J40 Bronchitis, not specified as acute or chronic: Secondary | ICD-10-CM

## 2024-08-01 MED ORDER — AZITHROMYCIN 250 MG PO TABS: ORAL_TABLET | ORAL | 0 refills | Status: AC

## 2024-08-01 MED ORDER — BENZONATATE 100 MG PO CAPS: 100.0000 mg | ORAL_CAPSULE | Freq: Three times a day (TID) | ORAL | 0 refills | Status: AC | PRN

## 2024-08-01 MED ORDER — PREDNISONE 20 MG PO TABS: 40.0000 mg | ORAL_TABLET | Freq: Every day | ORAL | 0 refills | Status: AC

## 2024-08-01 NOTE — Telephone Encounter (Signed)
 FYI Only or Action Required?: FYI only for provider: appointment scheduled on 08/01/2024 at 2:20 PM.  Patient was last seen in primary care on 07/24/2024 by Dettinger, Fonda LABOR, MD.  Called Nurse Triage reporting Cough.  Symptoms began 2 days ago.  Interventions attempted: Rest, hydration, or home remedies.  Symptoms are: unchanged.  Triage Disposition: See HCP Within 4 Hours (Or PCP Triage)  Patient/caregiver understands and will follow disposition?:   Copied from CRM #[id number]. Topic: Clinical - Red Word Triage >> Aug 01, 2024 10:57 AM Donna BRAVO wrote: Red Word that prompted transfer to Nurse Triage:  [place]   coughing up thick yellow mucus wheezing both lower lungs very week no fever oxygen lever good Reason for Disposition  Wheezing is present  Answer Assessment - Initial Assessment Questions Report received from Home Health RN. Called patient-verified cough with thick yellow mucus, wheezing and feeling weak. Patient states symptoms are better today. [facility] stated that she could get a portable XR done if provider wanted that. RN states if order received by 1 PM today, portable XR can be done at home. Discussed with patient being seen in person today or waiting on an order from the provider. Patient agreed to be seen in the office today. Scheduled to be seen today at 2:20 PM.   1. ONSET: When did the cough begin?      Started two days ago 2. SEVERITY: How bad is the cough today?      Mild cough 3. SPUTUM: Describe the color of your sputum (e.g., none, dry cough; clear, white, yellow, green)     Thick yellow mucus 4. HEMOPTYSIS: Are you coughing up any blood? If Yes, ask: How much? (e.g., flecks, streaks, tablespoons, etc.)     no 5. DIFFICULTY BREATHING: Are you having difficulty breathing? If Yes, ask: How bad is it? (e.g., mild, moderate, severe)      No SOB 6. FEVER: Do you have a fever? If Yes, ask: What is your temperature, how was it  measured, and when did it start?     no 7. CARDIAC HISTORY: Do you have any history of heart disease? (e.g., heart attack, congestive heart failure)      no 8. LUNG HISTORY: Do you have any history of lung disease?  (e.g., pulmonary embolus, asthma, emphysema)     yes 9. PE RISK FACTORS: Do you have a history of blood clots? (or: recent major surgery, recent prolonged travel, bedridden)     yes 10. OTHER SYMPTOMS: Do you have any other symptoms? (e.g., runny nose, wheezing, chest pain)       Wheezing in lower lungs per home health RN 12. TRAVEL: Have you traveled out of the country in the last month? (e.g., travel history, exposures)       no  Protocols used: Cough - Acute Productive-A-AH

## 2024-08-01 NOTE — Telephone Encounter (Signed)
 Appt made.

## 2024-08-01 NOTE — Progress Notes (Signed)
 Subjective:    Patient ID: Kyle [name], male    DOB: 01-30-1935, 89 y.o.   MRN: [id number]   Chief Complaint: Cough   Cough This is [name] new problem. The current episode started in the past 7 days. The problem has been rapidly worsening. The problem occurs every few minutes. The cough is Productive of sputum. Associated symptoms include rhinorrhea. Pertinent negatives include no chills, fever or shortness of breath. Nothing aggravates the symptoms. He has tried OTC cough suppressant for the symptoms. The treatment provided mild relief.    Patient Active Problem List   Diagnosis Date Noted   Status post CVA 06/06/2024   Clavicle fracture 06/06/2024   Hypertension 12/01/2022   Hyperlipidemia 05/07/2009   MITRAL REGURGITATION 05/07/2009   Atrial fibrillation (HCC) 05/07/2009       Review of Systems  Constitutional:  Negative for chills and fever.  HENT:  Positive for rhinorrhea.   Respiratory:  Positive for cough. Negative for shortness of breath.        Objective:   Physical Exam Constitutional:      Appearance: Normal appearance.  Cardiovascular:     Rate and Rhythm: Normal rate and regular rhythm.     Heart sounds: Normal heart sounds.  Pulmonary:     Effort: Pulmonary effort is normal.     Breath sounds: Wheezing (exp wheezes bil bases) present.  Skin:    General: Skin is warm.  Neurological:     General: No focal deficit present.     Mental Status: He is alert and oriented to person, place, and time.  Psychiatric:        Mood and Affect: Mood normal.        Behavior: Behavior normal.    BP 132/78   Pulse 83   Temp 97.7 F (36.5 C) (Temporal)   Ht 5' 3 (1.6 m)   Wt 153 lb (69.4 kg)   SpO2 97%   BMI 27.10 kg/m         Assessment & Plan:   Kyle [name] in today with chief complaint of Cough   1. Bronchitis (Primary) 1. Take meds as prescribed 2. Use [name] cool mist humidifier especially during the winter months and when heat has been humid. 3.  Use saline nose sprays frequently 4. Saline irrigations of the nose can be very helpful if done frequently.  * 4X daily for 1 week*  * Use of [name] nettie pot can be helpful with this. Follow directions with this* 5. Drink plenty of fluids 6. Keep thermostat turn down low 7.For any cough or congestion- tessalon  perles 8. For fever or aces or pains- take tylenol  or ibuprofen appropriate for age and weight.  * for fevers greater than 101 orally you may alternate ibuprofen and tylenol  every  3 hours.    Meds ordered this encounter  Medications   azithromycin  (ZITHROMAX  Z-PAK) 250 MG tablet    Sig: As directed    Dispense:  6 tablet    Refill:  0    Supervising Provider:   DETTINGER, JOSHUA [name] [[id number]]   predniSONE  (DELTASONE ) 20 MG tablet    Sig: Take 2 tablets (40 mg total) by mouth daily with breakfast for 5 days. 2 po daily for 5 days    Dispense:  10 tablet    Refill:  0    Supervising Provider:   DETTINGER, JOSHUA [name] [[id number]]   benzonatate  (TESSALON ) 100 MG capsule    Sig: Take 1 capsule (100  mg total) by mouth 3 (three) times daily as needed for cough.    Dispense:  30 capsule    Refill:  0    Supervising Provider:   MARYANNE CHEW [name] [[id number]]      The above assessment and management plan was discussed with the patient. The patient verbalized understanding of and has agreed to the management plan. Patient is aware to call the clinic if symptoms persist or worsen. Patient is aware when to return to the clinic for [name] follow-up visit. Patient educated on when it is appropriate to go to the emergency department.   Kyle Gladis, FNP

## 2024-08-01 NOTE — Patient Instructions (Signed)

## 2024-08-12 ENCOUNTER — Encounter: Payer: Self-pay | Admitting: Physical Medicine & Rehabilitation

## 2024-08-12 ENCOUNTER — Encounter: Attending: Registered Nurse | Admitting: Physical Medicine & Rehabilitation

## 2024-08-12 VITALS — BP 140/85 | HR 89 | Ht 62.0 in | Wt 150.0 lb

## 2024-08-12 DIAGNOSIS — I6381 Other cerebral infarction due to occlusion or stenosis of small artery: Secondary | ICD-10-CM | POA: Diagnosis not present

## 2024-08-12 NOTE — Progress Notes (Signed)
 Subjective:    Patient ID: Kyle [name], male    DOB: 01/18/1935, 89 y.o.   MRN: [id number]  HPI Discussed the use of AI scribe software for clinical note transcription with the patient, who gave verbal consent to proceed.  History of Present Illness Kyle [name] is an 89 year old male with a history of right-sided stroke who presents for follow-up regarding cognitive and functional recovery.  In October, he experienced a right-sided stroke, resulting in cognitive and functional challenges. He has short-term memory issues, such as forgetting to turn off water taps, leading to plumbing problems. He struggles with tasks requiring fine motor skills, like buttoning shirts, and reports numbness and cold sensation in his fingers. Despite these challenges, he can cook breakfast and manage some daily activities independently.  He reports improvement in his ability to walk and denies any recent falls. He can perform dressing and bathing independently, although he struggles with buttoning shirts due to decreased sensation in his fingers. His fingers get cold easily and feel numb, and he attributes this to his age.  He has a history of cataract surgery on both eyes and reports decreased vision in his left eye, which was present before the stroke. He noticed temporary improvement in vision while on prednisone .  His family is concerned about his ability to stay alone due to his memory issues, particularly with leaving the stove on and water running. They provide 24-hour care but hope to gradually allow him more independence. He sleeps approximately 12 hours a night and has a history of getting up at night, which has improved recently.  He has a history of a collarbone fracture, which has healed and is no longer causing discomfort.   EXAM: MRI BRAIN WITHOUT CONTRAST 06/06/2024 05:21:59 AM   TECHNIQUE: Multiplanar multisequence MRI of the head/brain was performed without the administration of  intravenous contrast.   COMPARISON: CT of the head dated 06/05/2024 and MRI of the head dated 11/10/2005.   CLINICAL HISTORY: Neuro deficit, acute, stroke suspected.   FINDINGS:   BRAIN AND VENTRICLES: Restricted diffusion present within the right basal ganglia compatible with acute nonhemorrhagic infarct. Restricted diffusion present within the right parietal lobe cortex compatible with small embolic infarcts. Blooming artifact present within the right basal ganglia, suggesting petechial hemorrhage. Focus of hemosiderin staining present laterally within the left cerebellar hemisphere. Moderate periventricular white matter disease. No mass. No midline shift. No hydrocephalus. No significant cerebral swelling or mass effect. The sella is unremarkable. Normal flow voids.   ORBITS: No acute abnormality.   SINUSES AND MASTOIDS: No acute abnormality.   BONES AND SOFT TISSUES: Normal marrow signal. No acute soft tissue abnormality.   IMPRESSION: 1. Acute infarct in the right basal ganglia with associated petechial hemorrhage. 2. Small acute embolic infarcts in the right parietal cortex. 3. No significant cerebral swelling or mass effect. 4. Moderate chronic periventricular white matter disease.   Electronically signed by: Evalene Coho MD 06/06/2024 05:59 AM EDT RP Pain Inventory Average Pain 0 Pain Right Now 0 My pain is numbness in finger tips  In the last 24 hours, has pain interfered with the following? General activity 0 Relation with others 0 Enjoyment of life 0  Sleep (in general) Good  Pain is worse with: no pain Pain improves with: no pain Relief from Meds: none taken  Family History  Problem Relation Age of Onset   COPD Father    Cancer Brother        melanoma  Heart disease Neg Hx    Colon cancer Neg Hx    Social History   Socioeconomic History   Marital status: Married    Spouse name: Not on file   Number of children: 2   Years of  education: Not on file   Highest education level: Not on file  Occupational History   Occupation: Retired from plumbing  Tobacco Use   Smoking status: Former    Types: Cigarettes   Smokeless tobacco: Never  Vaping Use   Vaping status: Never Used  Substance and Sexual Activity   Alcohol use: No   Drug use: No   Sexual activity: Not on file  Other Topics Concern   Not on file  Social History Narrative   Married. Lives in [place]   His wife is paralyzed and he cares for her - bathing her, etc   She is now under hospice care in home.   Social Drivers of Corporate Investment Banker Strain: Low Risk  (09/06/2023)   Overall Financial Resource Strain (CARDIA)    Difficulty of Paying Living Expenses: Not hard at all  Food Insecurity: No Food Insecurity (06/06/2024)   Hunger Vital Sign    Worried About Running Out of Food in the Last Year: Never true    Ran Out of Food in the Last Year: Never true  Transportation Needs: No Transportation Needs (06/06/2024)   PRAPARE - Administrator, Civil Service (Medical): No    Lack of Transportation (Non-Medical): No  Physical Activity: Insufficiently Active (09/06/2023)   Exercise Vital Sign    Days of Exercise per Week: 3 days    Minutes of Exercise per Session: 30 min  Stress: No Stress Concern Present (09/06/2023)   [employer] of Occupational Health - Occupational Stress Questionnaire    Feeling of Stress : Not at all  Social Connections: Moderately Integrated (06/06/2024)   Social Connection and Isolation Panel    Frequency of Communication with Friends and Family: Three times a week    Frequency of Social Gatherings with Friends and Family: Three times a week    Attends Religious Services: 1 to 4 times per year    Active Member of Clubs or Organizations: Yes    Attends Banker Meetings: 1 to 4 times per year    Marital Status: Widowed   Past Surgical History:  Procedure Laterality Date   CARDIAC  CATHETERIZATION  10/2005   COLONOSCOPY     MITRAL VALVE REPAIR  10/17/2005   Dr. Dusty   POLYPECTOMY     SKIN CANCER EXCISION Left    cheek   Past Surgical History:  Procedure Laterality Date   CARDIAC CATHETERIZATION  10/2005   COLONOSCOPY     MITRAL VALVE REPAIR  10/17/2005   Dr. Dusty   POLYPECTOMY     SKIN CANCER EXCISION Left    cheek   Past Medical History:  Diagnosis Date   Atrial fibrillation [facility])    Post operative, maintaining normal sinus rhythm on amiodarone   Benign prostatic hypertrophy    CAD (coronary artery disease)    Non obstructive by catheterization Feb 2007 prior to his surgery revealing a 40-50% LAD lesion, good LV function with EF 55-60% by last echocardiogram prior to his surgery   Hyperlipidemia    Treated   Mitral regurgitation    S/P mitral valve repair Oct 17, 2005 by Dr. Dusty   Skin cancer    Ht 5' 2 (1.575 m)  Wt 150 lb (68 kg)   BMI 27.44 kg/m   Opioid Risk Score:   Fall Risk Score:  `1  Depression screen [facility] 2/9     08/01/2024    2:24 PM 07/24/2024    4:00 PM 07/02/2024    1:33 PM 06/24/2024    2:31 PM 12/05/2023    8:08 AM 09/06/2023    1:14 PM 06/04/2023    7:57 AM  Depression screen PHQ 2/9  Decreased Interest 0 0 0 0 0 0 0  Down, Depressed, Hopeless 0 0 0 0 0 0 0  PHQ - 2 Score 0 0 0 0 0 0 0  Altered sleeping   0 0   0  Tired, decreased energy   0 0   0  Change in appetite   0 0   0  Feeling bad or failure about yourself    0 0   0  Trouble concentrating   0 3   0  Moving slowly or fidgety/restless   0 3   0  Suicidal thoughts   0 0   0  PHQ-9 Score   0  6    0   Difficult doing work/chores    Very difficult   Not difficult at all     Data saved with a previous flowsheet row definition    Review of Systems  Neurological:  Positive for numbness.       Numbness in fingers Memory issues  All other systems reviewed and are negative.      Objective:   Physical Exam  General no acute distress Mood and affect  appropriate Speech without dysarthria or aphasia There is no evidence of field cut Extraocular muscles are intact Motor strength is 5/5 bilateral deltoid bicep tricep grip as well as hip flexor knee extensor ankle dorsiflexor       Assessment & Plan:  Assessment and Plan Assessment & Plan Functional deficits secondary to right basal ganglia and parietal infarct (post-stroke cognitive and physical impairment) Post-stroke cognitive and physical impairment with short-term memory issues and occasional forgetfulness. Physical improvement noted. Cognitive recovery may take up to a year with partial improvement expected. - Continue current therapy until December 8th. - Consider outpatient cognitive therapy if home health therapy is not renewed, I do think he would benefit from additional speech therapy to address medication management using telephone etc.  - no driving and stove use when alone. - Monitor for cognitive improvement over time. Patient will follow-up with neurology as well as primary care as well as ophthalmology  Paresthesia and numbness of fingers Numbness and cold sensation in fingers, likely age-related or due to a pinched nerve. - Consider nerve conduction studies to evaluate for pinched nerve.  Family will consider this they can call back if they want to schedule EMG/NCV - Discuss potential for cortisone injection if pinched nerve is confirmed.  Daughter is with patient today we discussed the above.

## 2024-08-19 ENCOUNTER — Telehealth: Payer: Self-pay | Admitting: *Deleted

## 2024-08-19 NOTE — Telephone Encounter (Signed)
-----   Message from Alvan Carrier sent at 08/19/2024 10:56 AM EST ----- Can we forward this info on to the patient please  JINNY Alvan MD ----- Message ----- From: Darrell Bruckner, [facility] Sent: 07/25/2024   9:39 AM EST To: Carrier JULIANNA Alvan, MD  Could potentially increase bleeding risk since it contains fish oil but risk is fairly low ----- Message ----- From: Alvan Carrier JULIANNA, MD Sent: 07/24/2024   9:42 AM EST To: Christopher Pavero, RPH  Any interaction between OTC osteo biflex and his stating or eliquis , patient would like to restart taking   JINNY Alvan MD

## 2024-08-19 NOTE — Telephone Encounter (Signed)
 Marval Brazen (daughter) notified & verbalized understanding.

## 2024-10-24 ENCOUNTER — Ambulatory Visit: Admitting: Family Medicine

## 2025-03-02 ENCOUNTER — Ambulatory Visit: Admitting: Adult Health
